# Patient Record
Sex: Female | Born: 1965 | Race: Black or African American | Hispanic: No | Marital: Single
Health system: Southern US, Community
[De-identification: ages and names within clinical notes are randomized; demographics above are authoritative.]

## PROBLEM LIST (undated history)

## (undated) DIAGNOSIS — F250 Schizoaffective disorder, bipolar type: Secondary | ICD-10-CM

## (undated) DIAGNOSIS — F259 Schizoaffective disorder, unspecified: Secondary | ICD-10-CM

## (undated) DIAGNOSIS — K219 Gastro-esophageal reflux disease without esophagitis: Secondary | ICD-10-CM

## (undated) DIAGNOSIS — F4310 Post-traumatic stress disorder, unspecified: Secondary | ICD-10-CM

## (undated) DIAGNOSIS — E119 Type 2 diabetes mellitus without complications: Secondary | ICD-10-CM

## (undated) DIAGNOSIS — I10 Essential (primary) hypertension: Secondary | ICD-10-CM

## (undated) HISTORY — PX: SKIN GRAFT: SHX250

## (undated) HISTORY — PX: TUBAL LIGATION: SHX77

---

## 2003-10-28 ENCOUNTER — Inpatient Hospital Stay (HOSPITAL_COMMUNITY): Admission: AD | Admit: 2003-10-28 | Discharge: 2003-10-28 | Payer: Self-pay | Admitting: Obstetrics & Gynecology

## 2008-01-03 ENCOUNTER — Inpatient Hospital Stay (HOSPITAL_COMMUNITY): Admission: AD | Admit: 2008-01-03 | Discharge: 2008-01-08 | Payer: Self-pay | Admitting: Psychiatry

## 2008-01-03 ENCOUNTER — Ambulatory Visit: Payer: Self-pay | Admitting: Psychiatry

## 2010-09-26 NOTE — H&P (Signed)
[name], [name]               ACCOUNT NO.:  [id number]   MEDICAL RECORD NO.:  [id number]          PATIENT TYPE:  IPS   LOCATION:  0402                          FACILITY:  [facility]   PHYSICIAN:  Geoffery Lyons, M.D.      DATE OF BIRTH:  06-24-1965   DATE OF ADMISSION:  01/03/2008  DATE OF DISCHARGE:                       PSYCHIATRIC ADMISSION ASSESSMENT   DATE AND TIME OF ASSESSMENT:  January 04, 2008 at 7:55 a.m.   IDENTIFYING INFORMATION:  A 45 year old [place] American female who is  single.  This is a voluntary admission.   HISTORY OF PRESENT ILLNESS:  The first [facility] admission, Katherine Franklin presents  with pressured speech, some flight of ideas, agitated this morning.  Came to the emergency room requesting detox from cocaine and alcohol.  Says that she has been diagnosed with schizoaffective disorder.  She has  not taken any of her Abilify or other medications in about a month.  York Spaniel that she was enrolled in classes for drug and alcohol abuse at  [facility], but that angry and left the program.  She has been  sleeping in a pile of newspapers behind the bank and exchanging sex for  drugs or money.  She is a poor historian date today due to  cooperativeness.  Mood and affect are quite labile and she has been  shouting at the staff.  She denies any suicidal thoughts.   Note that the patient reports that she drank 2 glasses of alcohol  yesterday and also drank 1 more time this week.  Says she drinks only  occasionally during the week.  Cocaine has been every day as much as I  can get my hands on.   PAST PSYCHIATRIC HISTORY:  First [facility] admission.  Currently followed by  [facility], Dr. Lily Peer.  Says that she typically  takes Abilify 40 mg daily and has not taken that in more than a month.  She does endorse prior suicide attempts.  Endorses prior admissions to  [facility] in [place] in the past, rehab facility in [place], [place]  [place] and most recently within the past  year had a stay at one of our  state hospitals, which she will not specify.  Says that she has had  trials in the past of Depakote, Risperdal and Seroquel and many more  medications, but refuses to consider anything except Abilify.   SOCIAL HISTORY:  Single African American female, homeless.  Reports she  has family and children, but will not give details.   FAMILY HISTORY:  Not available.   MEDICAL HISTORY:  Primary care practitioner is the downtown [facility].  Medical problems are hypertension and diabetes.   MEDICATIONS:  Abilify, HCTZ, enalapril. metformin and glipizide, doses  not known.  No medications in more than a month.   DRUG ALLERGIES:  SULFA AND IRON, REACTION NOT KNOWN.   POSITIVE PHYSICAL FINDINGS:  Physical exam was done in the emergency  room, is noted in the record.  This is a tall, obese female, disheveled.  VITAL SIGNS:  On admission to our unit, temperature 97.9,  pulse 91,  blood pressure 153/102 and respirations 20.   DIAGNOSTIC STUDIES:  Diagnostic studies were done in the emergency room.  Hemoglobin 10.5, hematocrit 31.0.  Her chemistry sodium 137, potassium  3.6, chloride 103, carbon dioxide not done, BUN 15, creatinine 1.0 and  random glucose was 191.  Pregnancy test negative.  Urine drug screen  positive for cocaine.  Routine urinalysis showed trace leukocytes and  WBCs 0-2 per high-powered field.   MENTAL STATUS EXAMINATION:  Fully alert, oriented to person, place and  situation.  Affect is labile.  She is agitated 80.  Speech is pressured,  rambling shouting and cursing at the staff when they attempt to help  her.  She has had increased motor restlessness this morning, pacing in  the halls, wearing little clothing and exposing herself from time to  time.  Has been uncooperative, slightly intrusive and aggressive at  times.  Mood is irritable and labile.  Thought process, guarded,  paranoid references, belief that staff are against her, or out to  harm  her, if they offer to help her find things or help with her clothing.  No clear suicidal thoughts.  She is a poor historian today due to her  paranoia and lability and generally uncooperative with exam.  Cognitively she is intact and oriented x3.   IMPRESSION:  AXIS I:  Psychosis, not otherwise specified, rule out  schizoaffective disorder.  Polysubstance dependence.  AXIS II:  Deferred.  AXIS III:  Hypertension and diabetes mellitus by history.  AXIS IV:  Severe issues with homelessness.  AXIS V:  Current 32 and past year, not known.   PLAN:  Voluntarily admit her to manage her psychotic symptoms and  hopefully treat her cocaine addiction.  We are going to restart her  Abilify 10 mg, first dose now and we will also start enalapril 10 mg  daily, hydrochlorothiazide 25 mg daily, Ativan 1 mg q.4 h. p.r.n. for  agitation and she will receive a dose now and metformin 500 mg daily.  Will monitor vital signs in progress and adjust as needed.  She  understands and is in agreement with the plan, agreeing to take  medication.      Margaret A. Scott, N.P.      Geoffery Lyons, M.D.  Electronically Signed    MAS/MEDQ  D:  01/05/2008  T:  01/05/2008  Job:  [id number]

## 2010-09-26 NOTE — Discharge Summary (Signed)
Katherine Franklin, Katherine Franklin               ACCOUNT NO.:  [id number]   MEDICAL RECORD NO.:  [id number]          PATIENT TYPE:  IPS   LOCATION:  0402                          FACILITY:  [facility]   PHYSICIAN:  Anselm Jungling, MD  DATE OF BIRTH:  Jan 22, 1966   DATE OF ADMISSION:  01/03/2008  DATE OF DISCHARGE:  01/08/2008                               DISCHARGE SUMMARY   IDENTIFYING DATA AND REASON FOR ADMISSION:  The patient is a 45 year old  single African American female admitted with signs and symptoms of  psychosis and possible mania.  The time of admission she was highly  agitated and pressured.  Please refer to the admission note for further  details pertaining to the symptoms, circumstances and history that led  to her hospitalization.  She was given an initial Axis I diagnosis of  rule out paranoid schizophrenia, acute, rule out manic episode, and rule  out substance-induced psychosis.   MEDICAL AND LABORATORY:  The patient was medically and physically  assessed by the psychiatric nurse practitioner.  She came to [place] with a  history of hypertension and diabetes mellitus.  She was continued on her  usual hydrochlorothiazide, Vasotec, and Glucophage.  There were no acute  medical issues.   HOSPITAL COURSE:  The patient was admitted to the adult inpatient  psychiatric service.  She presented as a well-nourished, normally-  developed female who was extremely alert, vigilant, and pressured.  However, she was aware of her situation and oriented to her  surroundings.  She admitted to recent drug abuse.  She admitted to need  for treatment, and swore she would never abuse drugs again.   She was treated with a regimen that included Abilify 10 mg q.i.d., which  she had taken previously, as well as Depakote.  Her mood and thinking  normalized over the next several days.  She gave [place] permission to  contact her daughter in [place], who indicated that she was willing to  have her mother comes stay  with her.   By the sixth hospital day the patient appeared sufficiently improved to  be able to be discharged to such an aftercare plan.  She agreed to the  following.   AFTERCARE:  The patient was to follow up at [facility]  in the [place] area.  The case manager was to call the patient with a  specific appointment time in date.   DISCHARGE MEDICATIONS:  1. Depakote 1000 mg nightly.  2. Abilify 10 mg q.i.d.  3. Hydrochlorothiazide 25 mg daily.  4. Vasotec 1 mg daily.  5. Glucophage 500 mg q. a.m.   The patient was instructed to follow up with her family doctor regarding  her general medical issues.   DISCHARGE DIAGNOSES:  AXIS I:  1. Bipolar disorder NOS.  2. History of substance abuse NOS.  AXIS II:  Deferred.  AXIS III:  History of hypertension, diabetes mellitus.  AXIS IV:  Stressors severe.  AXIS V:  GAF on discharge 55.      Anselm Jungling, MD  Electronically Signed     SPB/MEDQ  D:  01/08/2008  T:  01/08/2008  Job:  [id number]

## 2017-12-14 ENCOUNTER — Encounter (HOSPITAL_COMMUNITY): Payer: Self-pay | Admitting: *Deleted

## 2017-12-14 ENCOUNTER — Other Ambulatory Visit: Payer: Self-pay

## 2017-12-14 ENCOUNTER — Emergency Department (HOSPITAL_COMMUNITY)
Admission: EM | Admit: 2017-12-14 | Discharge: 2017-12-14 | Disposition: A | Discharge status: Home / Self Care | Payer: Medicaid Other | Attending: Emergency Medicine | Admitting: Emergency Medicine

## 2017-12-14 DIAGNOSIS — R739 Hyperglycemia, unspecified: Secondary | ICD-10-CM | POA: Diagnosis present

## 2017-12-14 DIAGNOSIS — F17200 Nicotine dependence, unspecified, uncomplicated: Secondary | ICD-10-CM | POA: Insufficient documentation

## 2017-12-14 DIAGNOSIS — Z91040 Latex allergy status: Secondary | ICD-10-CM | POA: Insufficient documentation

## 2017-12-14 DIAGNOSIS — I10 Essential (primary) hypertension: Secondary | ICD-10-CM | POA: Insufficient documentation

## 2017-12-14 DIAGNOSIS — E1165 Type 2 diabetes mellitus with hyperglycemia: Secondary | ICD-10-CM | POA: Insufficient documentation

## 2017-12-14 HISTORY — DX: Essential (primary) hypertension: I10

## 2017-12-14 HISTORY — DX: Type 2 diabetes mellitus without complications: E11.9

## 2017-12-14 HISTORY — DX: Post-traumatic stress disorder, unspecified: F43.10

## 2017-12-14 HISTORY — DX: Gastro-esophageal reflux disease without esophagitis: K21.9

## 2017-12-14 HISTORY — DX: Schizoaffective disorder, unspecified: F25.9

## 2017-12-14 HISTORY — DX: Schizoaffective disorder, bipolar type: F25.0

## 2017-12-14 LAB — I-STAT CHEM 8, ED
BUN: 13 mg/dL (ref 6–20)
CALCIUM ION: 1.22 mmol/L (ref 1.15–1.40)
Chloride: 102 mmol/L (ref 98–111)
Creatinine, Ser: 0.8 mg/dL (ref 0.44–1.00)
Glucose, Bld: 382 mg/dL — ABNORMAL HIGH (ref 70–99)
HEMATOCRIT: 32 % — AB (ref 36.0–46.0)
HEMOGLOBIN: 10.9 g/dL — AB (ref 12.0–15.0)
Potassium: 3.7 mmol/L (ref 3.5–5.1)
SODIUM: 140 mmol/L (ref 135–145)
TCO2: 25 mmol/L (ref 22–32)

## 2017-12-14 LAB — CBC WITH DIFFERENTIAL/PLATELET
BASOS PCT: 0 %
Basophils Absolute: 0 10*3/uL (ref 0.0–0.1)
EOS PCT: 3 %
Eosinophils Absolute: 0.1 10*3/uL (ref 0.0–0.7)
HCT: 33.8 % — ABNORMAL LOW (ref 36.0–46.0)
Hemoglobin: 10.8 g/dL — ABNORMAL LOW (ref 12.0–15.0)
LYMPHS ABS: 1.9 10*3/uL (ref 0.7–4.0)
Lymphocytes Relative: 50 %
MCH: 27.3 pg (ref 26.0–34.0)
MCHC: 32 g/dL (ref 30.0–36.0)
MCV: 85.6 fL (ref 78.0–100.0)
MONOS PCT: 7 %
Monocytes Absolute: 0.3 10*3/uL (ref 0.1–1.0)
NEUTROS PCT: 40 %
Neutro Abs: 1.6 10*3/uL — ABNORMAL LOW (ref 1.7–7.7)
Platelets: 241 10*3/uL (ref 150–400)
RBC: 3.95 MIL/uL (ref 3.87–5.11)
RDW: 13.8 % (ref 11.5–15.5)
WBC: 3.9 10*3/uL — ABNORMAL LOW (ref 4.0–10.5)

## 2017-12-14 LAB — CBG MONITORING, ED: Glucose-Capillary: 263 mg/dL — ABNORMAL HIGH (ref 70–99)

## 2017-12-14 LAB — BASIC METABOLIC PANEL
Anion gap: 11 (ref 5–15)
BUN: 14 mg/dL (ref 6–20)
CALCIUM: 9 mg/dL (ref 8.9–10.3)
CO2: 24 mmol/L (ref 22–32)
CREATININE: 0.93 mg/dL (ref 0.44–1.00)
Chloride: 104 mmol/L (ref 98–111)
GFR calc non Af Amer: >60 mL/min (ref >60)
Glucose, Bld: 387 mg/dL — ABNORMAL HIGH (ref 70–99)
Potassium: 3.6 mmol/L (ref 3.5–5.1)
SODIUM: 139 mmol/L (ref 135–145)

## 2017-12-14 MED ORDER — METFORMIN HCL 500 MG PO TABS: 500.0000 mg | ORAL_TABLET | Freq: Two times a day (BID) | ORAL | 0 refills | Status: DC

## 2017-12-14 MED ORDER — INSULIN ASPART 100 UNIT/ML ~~LOC~~ SOLN: 5.0000 [IU] | Freq: Once | SUBCUTANEOUS | Status: DC

## 2017-12-14 MED ORDER — SODIUM CHLORIDE 0.9 % IV BOLUS
2000.0000 mL | Freq: Once | INTRAVENOUS | Status: AC
  Administered 2017-12-14: 2000 mL via INTRAVENOUS

## 2017-12-14 NOTE — ED Notes (Signed)
Bed: WA06 Expected date:  Expected time:  Means of arrival:  Comments: 
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
## 2017-12-14 NOTE — ED Notes (Signed)
Pt refused insulin and wanted to leave. Notified MD. Pt refused discharge paperwork and vitals before departure. IV removed

## 2017-12-14 NOTE — ED Triage Notes (Signed)
Pt brought in via [facility]. Pt is AOx4. Pt is ambulatory. Pt called 911 due to having gut feeling sugar was elevated. Pt CBG was 434 with [facility]. 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 Pt has no other complaints.

## 2017-12-14 NOTE — Discharge Instructions (Addendum)
Please take your medication as prescribed

## 2017-12-16 NOTE — Care Management Note (Signed)
Case Management Note  CM consulted for medication needs and housing.  CM forwarded consult to CSW as all of pt's needs are social issues.  CM is unable to help with pt's medications as she has insurance coverage.  Updated Dr. Adela LankFloyd via messages.  No further CM needs noted at this time.  [name], Lynnae SandhoffAngela N, RN 12/16/2017, 9:33 AM

## 2018-06-18 ENCOUNTER — Emergency Department (HOSPITAL_COMMUNITY)
Admission: EM | Admit: 2018-06-18 | Discharge: 2018-06-19 | Disposition: A | Discharge status: Home / Self Care | Payer: Medicaid Other | Attending: Emergency Medicine | Admitting: Emergency Medicine

## 2018-06-18 ENCOUNTER — Encounter (HOSPITAL_COMMUNITY): Payer: Self-pay | Admitting: *Deleted

## 2018-06-18 ENCOUNTER — Other Ambulatory Visit: Payer: Self-pay

## 2018-06-18 DIAGNOSIS — F1994 Other psychoactive substance use, unspecified with psychoactive substance-induced mood disorder: Secondary | ICD-10-CM | POA: Insufficient documentation

## 2018-06-18 DIAGNOSIS — I10 Essential (primary) hypertension: Secondary | ICD-10-CM | POA: Diagnosis not present

## 2018-06-18 DIAGNOSIS — Z91040 Latex allergy status: Secondary | ICD-10-CM | POA: Insufficient documentation

## 2018-06-18 DIAGNOSIS — F1410 Cocaine abuse, uncomplicated: Secondary | ICD-10-CM | POA: Insufficient documentation

## 2018-06-18 DIAGNOSIS — R739 Hyperglycemia, unspecified: Secondary | ICD-10-CM

## 2018-06-18 DIAGNOSIS — Z79899 Other long term (current) drug therapy: Secondary | ICD-10-CM | POA: Insufficient documentation

## 2018-06-18 DIAGNOSIS — E1165 Type 2 diabetes mellitus with hyperglycemia: Secondary | ICD-10-CM | POA: Diagnosis not present

## 2018-06-18 DIAGNOSIS — F259 Schizoaffective disorder, unspecified: Secondary | ICD-10-CM | POA: Diagnosis not present

## 2018-06-18 DIAGNOSIS — R451 Restlessness and agitation: Secondary | ICD-10-CM | POA: Diagnosis present

## 2018-06-18 LAB — RAPID URINE DRUG SCREEN, HOSP PERFORMED
Amphetamines: NOT DETECTED
Barbiturates: NOT DETECTED
Benzodiazepines: NOT DETECTED
Cocaine: POSITIVE — AB
Opiates: NOT DETECTED
Tetrahydrocannabinol: NOT DETECTED

## 2018-06-18 LAB — URINALYSIS, ROUTINE W REFLEX MICROSCOPIC
Bilirubin Urine: NEGATIVE
Glucose, UA: >=500 mg/dL — AB
Ketones, ur: NEGATIVE mg/dL
NITRITE: NEGATIVE
Protein, ur: NEGATIVE mg/dL
Specific Gravity, Urine: 1.022 (ref 1.005–1.030)
pH: 7 (ref 5.0–8.0)

## 2018-06-18 LAB — COMPREHENSIVE METABOLIC PANEL
ALT: 15 U/L (ref 0–44)
ANION GAP: 12 (ref 5–15)
AST: 15 U/L (ref 15–41)
Albumin: 3.7 g/dL (ref 3.5–5.0)
Alkaline Phosphatase: 82 U/L (ref 38–126)
BILIRUBIN TOTAL: 0.5 mg/dL (ref 0.3–1.2)
BUN: 12 mg/dL (ref 6–20)
CHLORIDE: 101 mmol/L (ref 98–111)
CO2: 25 mmol/L (ref 22–32)
Calcium: 9.4 mg/dL (ref 8.9–10.3)
Creatinine, Ser: 0.85 mg/dL (ref 0.44–1.00)
GFR calc Af Amer: >60 mL/min (ref >60)
GFR calc non Af Amer: >60 mL/min (ref >60)
Glucose, Bld: 378 mg/dL — ABNORMAL HIGH (ref 70–99)
POTASSIUM: 4.3 mmol/L (ref 3.5–5.1)
SODIUM: 138 mmol/L (ref 135–145)
Total Protein: 7.6 g/dL (ref 6.5–8.1)

## 2018-06-18 LAB — CBC WITH DIFFERENTIAL/PLATELET
Abs Immature Granulocytes: 0.01 10*3/uL (ref 0.00–0.07)
BASOS ABS: 0.1 10*3/uL (ref 0.0–0.1)
Basophils Relative: 1 %
Eosinophils Absolute: 0.2 10*3/uL (ref 0.0–0.5)
Eosinophils Relative: 2 %
HEMATOCRIT: 38.5 % (ref 36.0–46.0)
Hemoglobin: 12.4 g/dL (ref 12.0–15.0)
Immature Granulocytes: 0 %
LYMPHS ABS: 2.4 10*3/uL (ref 0.7–4.0)
Lymphocytes Relative: 33 %
MCH: 27.6 pg (ref 26.0–34.0)
MCHC: 32.2 g/dL (ref 30.0–36.0)
MCV: 85.6 fL (ref 80.0–100.0)
Monocytes Absolute: 0.4 10*3/uL (ref 0.1–1.0)
Monocytes Relative: 5 %
Neutro Abs: 4.4 10*3/uL (ref 1.7–7.7)
Neutrophils Relative %: 59 %
Platelets: 409 10*3/uL — ABNORMAL HIGH (ref 150–400)
RBC: 4.5 MIL/uL (ref 3.87–5.11)
RDW: 14.3 % (ref 11.5–15.5)
WBC: 7.4 10*3/uL (ref 4.0–10.5)
nRBC: 0 % (ref 0.0–0.2)

## 2018-06-18 LAB — ACETAMINOPHEN LEVEL

## 2018-06-18 LAB — I-STAT BETA HCG BLOOD, ED (MC, WL, AP ONLY): I-stat hCG, quantitative: <5 m[IU]/mL (ref <5)

## 2018-06-18 LAB — SALICYLATE LEVEL: Salicylate Lvl: <7 mg/dL (ref 2.8–30.0)

## 2018-06-18 LAB — ETHANOL: Alcohol, Ethyl (B): <10 mg/dL

## 2018-06-18 MED ORDER — ACETAMINOPHEN 325 MG PO TABS
650.0000 mg | ORAL_TABLET | ORAL | Status: DC | PRN
  Filled 2018-06-18: qty 2

## 2018-06-18 MED ORDER — ONDANSETRON HCL 4 MG PO TABS: 4.0000 mg | ORAL_TABLET | Freq: Three times a day (TID) | ORAL | Status: DC | PRN

## 2018-06-18 MED ORDER — STERILE WATER FOR INJECTION IJ SOLN
INTRAMUSCULAR | Status: AC
  Filled 2018-06-18: qty 10

## 2018-06-18 MED ORDER — SODIUM CHLORIDE 0.9 % IV BOLUS
1000.0000 mL | Freq: Once | INTRAVENOUS | Status: AC
  Administered 2018-06-18: 1000 mL via INTRAVENOUS

## 2018-06-18 MED ORDER — LORAZEPAM 1 MG PO TABS
1.0000 mg | ORAL_TABLET | ORAL | Status: AC | PRN
  Administered 2018-06-19: 1 mg via ORAL
  Filled 2018-06-18: qty 1

## 2018-06-18 MED ORDER — ZIPRASIDONE MESYLATE 20 MG IM SOLR
20.0000 mg | Freq: Once | INTRAMUSCULAR | Status: AC
  Administered 2018-06-18: 20 mg via INTRAMUSCULAR
  Filled 2018-06-18: qty 20

## 2018-06-18 MED ORDER — METFORMIN HCL 500 MG PO TABS
500.0000 mg | ORAL_TABLET | Freq: Two times a day (BID) | ORAL | Status: DC
  Administered 2018-06-18 – 2018-06-19 (×2): 500 mg via ORAL
  Filled 2018-06-18 (×2): qty 1

## 2018-06-18 MED ORDER — NICOTINE 21 MG/24HR TD PT24: 21.0000 mg | MEDICATED_PATCH | Freq: Every day | TRANSDERMAL | Status: DC

## 2018-06-18 MED ORDER — LORAZEPAM 2 MG/ML IJ SOLN
2.0000 mg | Freq: Once | INTRAMUSCULAR | Status: AC
  Administered 2018-06-18: 2 mg via INTRAVENOUS
  Filled 2018-06-18: qty 1

## 2018-06-18 MED ORDER — ARIPIPRAZOLE 5 MG PO TABS
5.0000 mg | ORAL_TABLET | Freq: Once | ORAL | Status: AC
  Administered 2018-06-18: 5 mg via ORAL
  Filled 2018-06-18: qty 1

## 2018-06-18 MED ORDER — ARIPIPRAZOLE 5 MG PO TABS
5.0000 mg | ORAL_TABLET | Freq: Every day | ORAL | Status: DC
  Administered 2018-06-19: 5 mg via ORAL
  Filled 2018-06-18: qty 1

## 2018-06-18 MED ORDER — ZOLPIDEM TARTRATE 5 MG PO TABS: 5.0000 mg | ORAL_TABLET | Freq: Every evening | ORAL | Status: DC | PRN

## 2018-06-18 NOTE — ED Notes (Addendum)
Pt stood in doorway and pee on floor; Pt c/o about food she was given at dinner and in room throwing the table around; everything removed from the room and and door closed; pt advised she will clean her on urine up or sit in room with smell all night; security called for safety; blinds to room remain open and patient is in viability of sitter; Pt currently in bed-Moniqe,RN

## 2018-06-18 NOTE — ED Notes (Signed)
Original IVC papers placed in red folder and faxed to [facility]; copy placed in medical records and 3 copies on chart; Pt has belongings previously inventoried by prior RN  in locker #2-Monique,RN
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
## 2018-06-18 NOTE — ED Notes (Addendum)
Pt given saltines, chicken broth and Sprite, per Dr. Criss Alvine.

## 2018-06-18 NOTE — ED Notes (Signed)
Pt given chicken broth, crackers and Sprite, per Toniann Fail - RN.

## 2018-06-18 NOTE — BH Assessment (Signed)
Assessment Note  Katherine Franklin is a 53 y.o. female who [facility] basis with complaint of homicidal ideation, intermittent suicidal ideation, despondency.  Per hospital report:   Pt arrived via the EMS door and just walked in yelling she was going to kill some MFs and she even said self and put her hand up to the head with a gun like gesture. Escorted patient directly to a room and told her she will have to calm down and listen. She said she just got down here that she needed help, she smoked crack for the pain in her feet and she has intermittent HI/SI.     Pt provided history.  Pt reported as follows:  She stated that she uses an 8-ball of crack cocaine daily, and that she used today.  Pt stated that today she feels homicidal, with a desire to slit her mother's throat (mother lives in an unknown nursing home) and to [place] drug [place].  Pt also endorsed despondency and intermittent suicidal ideation.  Pt denied suicidal ideation at time of assessment, but she stated that she felt suicidal earlier today.  Pt reported that when she feels suicidal, she thinks about walking into traffic.  Pt also reported that she attempted suicide several years ago by walking into traffic.  Pt endorsed despondency and recent psychosocial stressors -- she works as a prostitute, and she is recently homeless.  Pt denied current psychiatric care, but stated that she is prescribed psychotropic meds through an unnamed PCP.  Pt stated that she would like help in getting back onto medication.   Pt fell asleep several times during assessment and had to be roused.  Per records, Pt has two upcoming court [place] -- one for two counts of possession of drug paraphernalia and one for solicitation of money/begging.  UDS and BAC were not available at time of assessment.  During assessment, Pt presented as oriented x4.  She was sleepy and fell asleep twice during  assessment.  Pt's mood was preoccupied (with getting help) and sad.  Pt's affect was euthymic and pleasant.  Pt endorsed homicidal ideation, recent suicidal ideation, despondency, and other symptoms.  Pt also endorsed substance use.  Pt's speech was normal in rate, rhythm, and volume.  Pt's thought processes were within normal range, and thought content was logical and gaol-oriented.  There was no evidence of delusion.  Pt denied hallucination.  Pt's memory and concentration were fair to poor.  Insight, judgment, and impulse control were poor.  Consulted with S. Rankin, NP, who determined that Pt should remain in ED, be stabilized, and re-evaluated in AM.   Diagnosis: Major Depressive Disorder, Recurrent, Severe w/o psychotic features; Cocaine Use Disorder  Past Medical History:  Past Medical History:  Diagnosis Date  . Diabetes mellitus without complication (HCC)   . GERD (gastroesophageal reflux disease)   . Hypertension   . PTSD (post-traumatic stress disorder)   . Schizo affective schizophrenia [facility](HCC)     Past Surgical History:  Procedure Laterality Date  . SKIN GRAFT      Family History: History reviewed. No pertinent family history.  Social History:  reports that she has been smoking. She has never used smokeless tobacco. She reports previous alcohol use. She reports current drug use. Frequency: 7.00 times per week. Drugs: Cocaine and "Crack" cocaine.  Additional Social History:  Alcohol / Drug Use Pain Medications: See MAR Prescriptions: See MAR [facility] the [facility]  History of alcohol / drug use?: Yes Substance #1 Name of Substance 1: Crack cocaine 1 - Amount (size/oz): 8 ball (about $200 worth) 1 - Frequency: Daily 1 - Duration: Ongoing 1 - Last Use / Amount: 06/18/2018  CIWA: CIWA-Ar BP: (!) 153/89 Pulse Rate: 95 COWS:    Allergies:  Allergies  Allergen Reactions  . Latex   . Sulfa Antibiotics     Home Medications: (Not in a hospital admission)   OB/GYN  Status:  No LMP recorded. Patient is postmenopausal.  General Assessment Data TTS Assessment: In system Is this a Tele or Face-to-Face Assessment?: Tele Assessment Is this an Initial Assessment or a Re-assessment for this encounter?: Initial Assessment Patient Accompanied by:: N/A Language Other than English: No Living Arrangements: Other (Comment) What gender do you identify as?: Female Marital status: Single Pregnancy Status: Unknown Living Arrangements: Alone Can pt return to current living arrangement?: Yes Admission Status: Voluntary Is patient capable of signing voluntary admission?: Yes Referral Source: Self/Family/Friend Insurance type: [employer]     Crisis Care Plan Living Arrangements: Alone Name of Psychiatrist: None Name of Therapist: None  Education Status Is patient currently in school?: No Is the patient employed, unemployed or receiving disability?: Employed  Risk to self with the past 6 months Has patient been a risk to self within the past 6 months prior to admission? : No Suicidal Intent: No Has patient had any suicidal intent within the past 6 months prior to admission? : No Is patient at risk for suicide?: (See notes) Suicidal Plan?: Yes-Currently Present Has patient had any suicidal plan within the past 6 months prior to admission? : No Specify Current Suicidal Plan: Walk into traffic Access to Means: Yes Specify Access to Suicidal Means: Traffic What has been your use of drugs/alcohol within the last 12 months?: Crack cocaine, cocaine, per report Previous Attempts/Gestures: Yes How many times?: 1 Triggers for Past Attempts: Unknown Intentional Self Injurious Behavior: None Family Suicide History: Unknown Recent stressful life event(s): Financial Problems(Recently lost home) Persecutory voices/beliefs?: No Depression: Yes Depression Symptoms: Despondent, Feeling angry/irritable Substance abuse history and/or treatment for substance abuse?:  No Suicide prevention information given to non-admitted patients: Not applicable  Risk to Others within the past 6 months Homicidal Ideation: Yes-Currently Present Does patient have any lifetime risk of violence toward others beyond the six months prior to admission? : Unknown Thoughts of Harm to Others: Yes-Currently Present Comment - Thoughts of Harm to Others: Wants to kill mother, drug dealers Current Homicidal Intent: No Current Homicidal Plan: Yes-Currently Present Describe Current Homicidal Plan: ''I want to slit my mother's throat'' Access to Homicidal Means: Yes Describe Access to Homicidal Means: Knives, blades Identified Victim: Mother, drug dealers History of harm to others?: (Unknown) Assessment of Violence: On admission Violent Behavior Description: Pt ran into ED threatening to kill ''MFers'' Does patient have access to weapons?: Yes (Comment) Criminal Charges Pending?: Yes Describe Pending Criminal Charges: poss drug paraphenalia; soliticitation,  Does patient have a court date: Yes Court Date: 07/07/18 Is patient on probation?: Unknown  Psychosis Hallucinations: None noted Delusions: None noted  Mental Status Report Appearance/Hygiene: Disheveled, In scrubs Eye Contact: Fair Motor Activity: Unremarkable Speech: Logical/coherent Level of Consciousness: Quiet/awake, Sedated(Pt fell asleep twice) Mood: Preoccupied, Sad Affect: Appropriate to circumstance Anxiety Level: None Thought Processes: Coherent, Relevant Judgement: Impaired Orientation: Person, Place, Time, Situation Obsessive Compulsive Thoughts/Behaviors: None  Cognitive Functioning Concentration: Normal Memory: Recent Impaired, Remote Impaired Is patient IDD: No Insight: Fair Impulse Control: Poor Appetite: Good  Have you had any weight changes? : No Change Sleep: No Change Vegetative Symptoms: None  ADLScreening [facility] Assessment Services) Patient's cognitive ability adequate to safely  complete daily activities?: Yes Patient able to express need for assistance with ADLs?: Yes Independently performs ADLs?: Yes (appropriate for developmental age)  Prior Inpatient Therapy Prior Inpatient Therapy: Yes Prior Therapy Dates: (cannot recall) Prior Therapy Facilty/Provider(s): cannot recall Reason for Treatment: SI  Prior Outpatient Therapy Prior Outpatient Therapy: No Does patient have an ACCT team?: No Does patient have Intensive In-House Services?  : No Does patient have [facility] services? : No Does patient have P4CC services?: No  ADL Screening (condition at time of admission) Patient's cognitive ability adequate to safely complete daily activities?: Yes Is the patient deaf or have difficulty hearing?: No Does the patient have difficulty seeing, even when wearing glasses/contacts?: No Does the patient have difficulty concentrating, remembering, or making decisions?: No Patient able to express need for assistance with ADLs?: Yes Does the patient have difficulty dressing or bathing?: No Independently performs ADLs?: Yes (appropriate for developmental age) Does the patient have difficulty walking or climbing stairs?: No Weakness of Legs: None Weakness of Arms/Hands: None  Home Assistive Devices/Equipment Home Assistive Devices/Equipment: None  Therapy Consults (therapy consults require a physician order) PT Evaluation Needed: No OT Evalulation Needed: No SLP Evaluation Needed: No Abuse/Neglect Assessment (Assessment to be complete while patient is alone) Abuse/Neglect Assessment Can Be Completed: Yes Physical Abuse: Denies Verbal Abuse: Denies Sexual Abuse: Denies, provider concered (Comment) Exploitation of patient/patient's resources: Denies Values / Beliefs Cultural Requests During Hospitalization: None Spiritual Requests During Hospitalization: None Consults Spiritual Care Consult Needed: No Social Work Consult Needed: No Merchant navy officer (For  Healthcare) Does Patient Have a Medical Advance Directive?: No          Disposition:  Disposition Initial Assessment Completed for this Encounter: Yes Patient referred to: Other (Comment)(Stabilize, sober, AM psych eval)  On Site Evaluation by:   Reviewed with Physician:    Dorris Fetch Karagan Lehr 06/18/2018 12:45 PM

## 2018-06-18 NOTE — ED Triage Notes (Signed)
Pt pulled shirt and bra at the staff desk shouting about she was going to shit on the walls and floor . Pt shouting about getting her disability.

## 2018-06-18 NOTE — ED Provider Notes (Signed)
Notified that pt is yelling at the desk, demanding her money.  Pt is loud and aggressive.  Geodon IM ordered.   Linwood Dibbles, MD 06/18/18 [phone]

## 2018-06-18 NOTE — ED Triage Notes (Signed)
Pt at staff desk demanding her money. Pt attempting to make  TC . Pt verbally loud  At desk and making unreasonable request. Pt refused a happy meal and demanding  Fried chicken and potatoes.

## 2018-06-18 NOTE — ED Triage Notes (Signed)
Pt refusing to return to room  

## 2018-06-18 NOTE — ED Triage Notes (Signed)
Pt arrived via the EMS door and just walked in yelling she was going to kill some MFs and she even said self and put her hand up to the head with a gun like gesture.  Escorted patient directly to a room and told her she will have to calm down and listen.  She said she just got down here that she needed help, she smoked crack for the pain in her feet and she has intermittent HI/SI.

## 2018-06-18 NOTE — ED Notes (Signed)
Charge RN aware of need for sitter, per staffing no sitter available at this time

## 2018-06-18 NOTE — ED Provider Notes (Signed)
[facility] EMERGENCY DEPARTMENT Provider Note   CSN: [id number] Arrival date & time: 06/18/18  1121   LEVEL 5 CAVEAT - ACUTE PSYCHOSIS  History   Chief Complaint Chief Complaint  Patient presents with  . Suicidal    HPI Katherine Franklin is a 53 y.o. female.  HPI  53 year old female with a history of schizophrenia and diabetes presents with acute agitation and suicidal/homicidal thoughts.  She states that she often abuses cocaine but wants to stop.  However she has chronic bilateral leg/foot pain that she attributes to neuropathy.  She states to help the pain go away she has been abusing crack since yesterday and into this morning.  She denies any acute headache or chest pain.  She states she wants to kill herself and 15 others.  She has a plan to take a gun and go to the mall and kill 15 people and then herself to make it 16.  She states she is had this plan for a long time.  No new injuries or pain to the feet, only her chronic neuropathy.  She is supposed to be on Abilify and Zoloft but has not taken these for months.  When she first arrived to the ER, she was screaming and agitated to multiple staff.  Currently she feels a little calmer.  Past Medical History:  Diagnosis Date  . Diabetes mellitus without complication (HCC)   . GERD (gastroesophageal reflux disease)   . Hypertension   . PTSD (post-traumatic stress disorder)   . Schizo affective schizophrenia (HCC)     There are no active problems to display for this patient.   Past Surgical History:  Procedure Laterality Date  . SKIN GRAFT       OB History   No obstetric history on file.      Home Medications    Prior to Admission medications   Medication Sig Start Date End Date Taking? Authorizing Provider  metFORMIN (GLUCOPHAGE) 500 MG tablet Take 1 tablet (500 mg total) by mouth 2 (two) times daily with a meal. 12/14/17   Melene PlanFloyd, Dan, DO    Family History History reviewed. No pertinent family  history.  Social History Social History   Tobacco Use  . Smoking status: Current Every Day Smoker  . Smokeless tobacco: Never Used  Substance Use Topics  . Alcohol use: Not Currently    Frequency: Never    Comment: BAC not available at time of assessment  . Drug use: Yes    Frequency: 7.0 times per week    Types: Cocaine, "Crack" cocaine    Comment: UDS not available at time of assessment     Allergies   Latex and Sulfa antibiotics   Review of Systems Review of Systems  Unable to perform ROS: Psychiatric disorder     Physical Exam Updated Vital Signs BP (!) 153/89 (BP Location: Right Arm)   Pulse 95   Temp 99.3 F (37.4 C) (Oral)   Resp 18   SpO2 93%   Physical Exam Vitals signs and nursing note reviewed.  Constitutional:      Appearance: She is well-developed. She is obese.  HENT:     Head: Normocephalic and atraumatic.     Right Ear: External ear normal.     Left Ear: External ear normal.     Nose: Nose normal.  Eyes:     General:        Right eye: No discharge.        Left eye:  No discharge.  Cardiovascular:     Rate and Rhythm: Normal rate and regular rhythm.     Pulses:          Dorsalis pedis pulses are 2+ on the right side and 2+ on the left side.     Heart sounds: Normal heart sounds.  Pulmonary:     Effort: Pulmonary effort is normal.     Breath sounds: Normal breath sounds.  Abdominal:     Palpations: Abdomen is soft.     Tenderness: There is no abdominal tenderness.  Skin:    General: Skin is warm and dry.  Neurological:     Mental Status: She is alert.  Psychiatric:        Mood and Affect: Mood is not anxious.        Thought Content: Thought content includes homicidal and suicidal ideation. Thought content includes homicidal and suicidal plan.      ED Treatments / Results  Labs (all labs ordered are listed, but only abnormal results are displayed) Labs Reviewed  COMPREHENSIVE METABOLIC PANEL - Abnormal; Notable for the following  components:      Result Value   Glucose, Bld 378 (*)    All other components within normal limits  ACETAMINOPHEN LEVEL - Abnormal; Notable for the following components:   Acetaminophen (Tylenol), Serum <10 (*)    All other components within normal limits  URINALYSIS, ROUTINE W REFLEX MICROSCOPIC - Abnormal; Notable for the following components:   APPearance HAZY (*)    Glucose, UA >=500 (*)    Hgb urine dipstick SMALL (*)    Leukocytes, UA LARGE (*)    Bacteria, UA RARE (*)    All other components within normal limits  CBC WITH DIFFERENTIAL/PLATELET - Abnormal; Notable for the following components:   Platelets 409 (*)    All other components within normal limits  URINE CULTURE  ETHANOL  SALICYLATE LEVEL  RAPID URINE DRUG SCREEN, HOSP PERFORMED  HIV ANTIBODY (ROUTINE TESTING W REFLEX)  I-STAT BETA HCG BLOOD, ED ([facility], [facility], [facility] ONLY)    EKG EKG Interpretation  Date/Time:  Wednesday June 18 2018 12:07:19 EST Ventricular Rate:  92 PR Interval:    QRS Duration: 82 QT Interval:  363 QTC Calculation: 449 R Axis:   40 Text Interpretation:  Sinus rhythm Abnormal R-wave progression, early transition Confirmed by Pricilla Loveless [phone]) on 06/18/2018 12:55:15 PM Also confirmed by Pricilla Loveless [phone]), editor Barbette Hair [phone])  on 06/18/2018 1:47:50 PM   Radiology No results found.  Procedures Procedures (including critical care time)  Medications Ordered in ED Medications  metFORMIN (GLUCOPHAGE) tablet 500 mg (has no administration in time range)  ARIPiprazole (ABILIFY) tablet 5 mg (has no administration in time range)  LORazepam (ATIVAN) tablet 1 mg (has no administration in time range)  acetaminophen (TYLENOL) tablet 650 mg (has no administration in time range)  zolpidem (AMBIEN) tablet 5 mg (has no administration in time range)  ondansetron (ZOFRAN) tablet 4 mg (has no administration in time range)  nicotine (NICODERM CQ - dosed in mg/24 hours) patch 21 mg (has no  administration in time range)  LORazepam (ATIVAN) injection 2 mg (2 mg Intravenous Given 06/18/18 1154)  sodium chloride 0.9 % bolus 1,000 mL (0 mLs Intravenous Stopped 06/18/18 1438)  ARIPiprazole (ABILIFY) tablet 5 mg (5 mg Oral Given 06/18/18 1207)     Initial Impression / Assessment and Plan / ED Course  I have reviewed the triage vital signs and the nursing notes.  Pertinent  labs & imaging results that were available during my care of the patient were reviewed by me and considered in my medical decision making (see chart for details).     Patient was involuntarily committed based on her suicidal and homicidal thoughts.  She was given p.o. Abilify at the dose she states she is supposed to be on.  She is also given a dose of IV Ativan.  Besides some hypertension, vitals are okay and her lab work is benign.  She does have some hyperglycemia but no acidosis.  Urine shows large leukocytes but also squamous epithelial cells and no urinary symptoms.  She appears medically stable for psychiatric disposition.  Final Clinical Impressions(s) / ED Diagnoses   Final diagnoses:  None    ED Discharge Orders    None       Pricilla LovelessGoldston, Alicya Bena, MD 06/18/18 1443

## 2018-06-18 NOTE — ED Triage Notes (Signed)
Pt  In room shouting  Vulgar  Statements to Security and [employer]. Pt stated " I am going to shit all over thewall and the floor."

## 2018-06-18 NOTE — ED Notes (Signed)
Pt given 3XL scrubs. Pt expressed trouble with paper scrubs in the past. This NT instructed pt that if scrubs did not fit she could wear a gown for better comfort.

## 2018-06-19 ENCOUNTER — Encounter (HOSPITAL_COMMUNITY): Payer: Self-pay | Admitting: Registered Nurse

## 2018-06-19 LAB — HIV ANTIBODY (ROUTINE TESTING W REFLEX): HIV Screen 4th Generation wRfx: NONREACTIVE

## 2018-06-19 MED ORDER — ZIPRASIDONE MESYLATE 20 MG IM SOLR
20.0000 mg | Freq: Once | INTRAMUSCULAR | Status: DC | PRN
  Filled 2018-06-19: qty 20

## 2018-06-19 MED ORDER — STERILE WATER FOR INJECTION IJ SOLN
INTRAMUSCULAR | Status: AC
  Filled 2018-06-19: qty 10

## 2018-06-19 NOTE — Progress Notes (Signed)
Patient does not meet inpatient criteria and is recommended for d/c with resources.  CSW to fax resources for shelters and Adult Mental Health resources to patient's RN, Magda Paganini.  Timmothy Euler. Kaylyn Lim, MSW, LCSWA Disposition Clinical Social Work [phone] (cell) [phone] (office)

## 2018-06-19 NOTE — ED Notes (Signed)
Declined W/C at D/C and was escorted to lobby by RN. 

## 2018-06-19 NOTE — ED Notes (Signed)
Patient was given a snack and drink. 

## 2018-06-19 NOTE — ED Notes (Signed)
Pt woke yelling for call light stating she had to have a bowel movement; pt claims she only peed on the floor because no one told her the door was not locked; Pt was rude to staff and started demanding different items; pt is now taking a shower-Monique,RN

## 2018-06-19 NOTE — ED Notes (Signed)
Sitter advised RN that patient stood up beside the bed and urinated again; pt returned to bed; Door to room remains closed and blinds open for staff visibility of patient-Monique,RN

## 2018-06-19 NOTE — ED Triage Notes (Addendum)
PT reported she was mad at staff and peed on the floor on  Pur[address].

## 2018-06-19 NOTE — Consult Note (Signed)
Tele Assessment   Katherine Franklin, 53 y.o., female patient presented to [facility] walking through the EMS door yelling homicidal and suicidal threats; Reporting to EDP that she has a plan to take a gun; go to mall kill 15 people and then herself to make it 16.  Patient has a his or schizophrenia and non compliance with medication.  Review of chart notes patient seen at [facility] ED 05/26/18 requesting medication samples and then wanting to leave when she wasn't able to get them.  Patient seen via telepsych by this provider; chart reviewed and consulted with Dr. Lucianne Muss on 06/19/18.  On evaluation Katherine Franklin reports she came to [place] from [facility] "I OD on crack; got me a bus ticket in [place] and [place] to [place] cause they won't helping me over there.  I came through yall's back door.  I was going to [facility] but they won't helping me.  I just need to get some help I got diabetes and neuropathy in my feet real bad.  I just want to stay here in the hospital until my check comes."  Patient asked when she expected her check to come; "It comes the first of every month.  The crack ain't the problem; I just need some where to lay my head at night."  Patient states she has already been to the [facility] and "lady helped me get my ID and gave me a address for my check to be mailed to; she gave me the name of a place where I might be able to get some help for my crack use."  Patient states that she is not suicidal "Naw, I don't want to kill myself."  Patient states she has no intent or plans to harm or kill herself.  Patient states that she is not having any thoughts of hurting or killing anyone else "Naw, I ain't gonna hurt nobody; I would never hurt nobody."  Patient also denies psychosis.  "Sometimes I do but not right now.  I don't know the last time."  Patient denies paranoia. "I just need to get back to [address] to see if that lady can help me find a shelter."  Patient states that she plans to  stay in [place] and not return to [facility]  Discussed with patient community resource available for outpatient psychiatric services that could help with medications ([facility], [facility], and [facility]) and would also give her a list of local shelters.  Patient states she wants to leave before it gets to late so she can find somewhere to stat "and if I can fax a picture of my ID; the lady gave me a paper copy; they'll send me a debt card I can use.  None of these people will do it here."  Patient reporting homelessness and no family or friends.  Patient informs psychiatric history but no elaboration given only stating was she was previously receiving treatment.  Patient has remained calm, cooperative and smiled through out interview.   During evaluation Katherine Franklin is sitting up in bed; she is alert/oriented x 4; calm/cooperative; and mood congruent with affect.  Patient is speaking in a clear tone at moderate volume, and normal pace; with good eye contact.  Her thought process is coherent and relevant; There is no indication that she is currently responding to internal/external stimuli or experiencing delusional thought content.  Patient denies suicidal/self-harm/homicidal ideation, psychosis, and paranoia.  Patient has remained calm throughout assessment and has  answered questions appropriately.  Reviewing (Care Everywhere) patient has a significant history of frequent ED visits related to diabetes or requesting medications assistance.   Collateral gotten from Dennis AcresAudrey, RN patients nurse who reports that patient has been urinating in floor refusing to go to bathroom, states that patient has been yelling inappropriate sexual suggestions, cursing, took cloths off in front of nursing desk and fondled breast in front of men; and that patient stated that she was gong to "shit on floor and spread it all on the walls."      Recommendations:  Outpatient psychiatric services; Give  referral resources for community services for psychiatry and substance use   Disposition: No evidence of imminent risk to self or others at present.   Patient does not meet criteria for psychiatric inpatient admission. Supportive therapy provided about ongoing stressors. Refer to IOP. Discussed crisis plan, support from social network, calling 911, coming to the Emergency Department, and calling Suicide Hotline. [facility], and with the referral to outpatient psychiatric services provided as walk in for initial assessment   Spoke with Dr. Denton LankSteinl; informed of above recommendation and disposition   Assunta FoundShuvon Rankin, NP

## 2018-06-19 NOTE — ED Provider Notes (Signed)
Discussed pt with [facility] team - they indicate pt psych cleared for discharge, and that they have provider patient resources for outpt f/u.  Patient awake and alert, calm. No delusions or acute psychosis. Normal mood/affect. Pt denies thoughts of harm to self or others.   Pt currently appears stable for d/c.      Cathren Laine, MD 06/19/18 1041

## 2018-06-19 NOTE — ED Notes (Signed)
Pt woke up screaming to the top of her lungs stating she has to go the bathroom; RN opended the door and stated to patient she is able to walk to the bathroom; pt was argumentative and continued to yell at staff; Pt has peed on  Floor 3-4 times and refuses to walk to the bathroom; Door to room remains closed at this time; EDP contacted for possible PRN Geodon order-Monique,RN

## 2018-06-19 NOTE — ED Notes (Signed)
Patient using phone at this time.

## 2018-06-19 NOTE — ED Notes (Signed)
Regular Diet was ordered for Lunch. 

## 2018-06-19 NOTE — Discharge Instructions (Signed)
It was our pleasure to provide your ER care today - we hope that you feel better.  Avoid cocaine use. Use resource guide provided for outpatient substance abuse and mental health counseling and support.   Your blood pressure is high, and your blood sugar is high. Continue your diabetic medication, drink plenty of water,  and follow diabetic meal/eating plan. Follow up with primary care doctor in the next 2-3 days for follow up, including recheck of blood sugar and blood pressure.   For mental health issues and/or crisis, go directly to [facility].  Return to ER if worse, new symptoms, new or severe pain, trouble breathing, other concern.

## 2018-06-20 LAB — URINE CULTURE

## 2018-06-25 ENCOUNTER — Emergency Department (HOSPITAL_COMMUNITY)
Admission: EM | Admit: 2018-06-25 | Discharge: 2018-06-25 | Disposition: A | Discharge status: Home / Self Care | Payer: Medicaid Other | Attending: Emergency Medicine | Admitting: Emergency Medicine

## 2018-06-25 ENCOUNTER — Encounter (HOSPITAL_COMMUNITY): Payer: Self-pay | Admitting: Emergency Medicine

## 2018-06-25 DIAGNOSIS — Z5321 Procedure and treatment not carried out due to patient leaving prior to being seen by health care provider: Secondary | ICD-10-CM | POA: Diagnosis not present

## 2018-06-25 DIAGNOSIS — M79671 Pain in right foot: Secondary | ICD-10-CM

## 2018-06-25 DIAGNOSIS — M79672 Pain in left foot: Secondary | ICD-10-CM | POA: Diagnosis not present

## 2018-06-25 NOTE — ED Triage Notes (Signed)
Patient with bilateral foot pain due to neuropathy.  She has not been taking gabapentin, which she states does not work for her.

## 2018-06-25 NOTE — ED Provider Notes (Signed)
53 year old female here with complaints of neuropathy.  Contacted by nursing staff that she was screaming cussing and threatening people in the lobby.  I attempted to talk with her, she continued to cussing me attempted to look at her lower extremities she said "do not touch my fucking feet".  Patient is very agitated she would not allow for any further discussion.  She is cussing at police officers, flashing her genitalia in the waiting room.  I do not feel that her presence in the emergency room is safe for staff or patients in the waiting room.  Patient will need to be removed from the premises for safety of staff and patients.    Eyvonne Mechanic, PA-C 06/25/18 5003    Jacalyn Lefevre, MD 06/25/18 [phone]

## 2018-06-25 NOTE — ED Notes (Signed)
Pt given 2 warm blankets

## 2018-07-09 ENCOUNTER — Ambulatory Visit: Payer: Medicaid Other | Admitting: Family Medicine

## 2018-08-04 ENCOUNTER — Emergency Department (HOSPITAL_COMMUNITY)
Admission: EM | Admit: 2018-08-04 | Discharge: 2018-08-04 | Disposition: A | Discharge status: Home / Self Care | Payer: Medicaid Other | Attending: Emergency Medicine | Admitting: Emergency Medicine

## 2018-08-04 ENCOUNTER — Other Ambulatory Visit: Payer: Self-pay

## 2018-08-04 DIAGNOSIS — I10 Essential (primary) hypertension: Secondary | ICD-10-CM | POA: Insufficient documentation

## 2018-08-04 DIAGNOSIS — Z91040 Latex allergy status: Secondary | ICD-10-CM | POA: Insufficient documentation

## 2018-08-04 DIAGNOSIS — Z5900 Homelessness unspecified: Secondary | ICD-10-CM

## 2018-08-04 DIAGNOSIS — E1165 Type 2 diabetes mellitus with hyperglycemia: Secondary | ICD-10-CM | POA: Insufficient documentation

## 2018-08-04 DIAGNOSIS — Z9114 Patient's other noncompliance with medication regimen: Secondary | ICD-10-CM | POA: Diagnosis not present

## 2018-08-04 DIAGNOSIS — F17200 Nicotine dependence, unspecified, uncomplicated: Secondary | ICD-10-CM | POA: Diagnosis not present

## 2018-08-04 DIAGNOSIS — R739 Hyperglycemia, unspecified: Secondary | ICD-10-CM

## 2018-08-04 LAB — BASIC METABOLIC PANEL
Anion gap: 11 (ref 5–15)
BUN: 27 mg/dL — ABNORMAL HIGH (ref 6–20)
CALCIUM: 8.8 mg/dL — AB (ref 8.9–10.3)
CO2: 26 mmol/L (ref 22–32)
Chloride: 102 mmol/L (ref 98–111)
Creatinine, Ser: 0.97 mg/dL (ref 0.44–1.00)
GFR calc Af Amer: >60 mL/min (ref >60)
Glucose, Bld: 313 mg/dL — ABNORMAL HIGH (ref 70–99)
Potassium: 3.7 mmol/L (ref 3.5–5.1)
Sodium: 139 mmol/L (ref 135–145)

## 2018-08-04 LAB — CBC WITH DIFFERENTIAL/PLATELET
Abs Immature Granulocytes: 0.01 10*3/uL (ref 0.00–0.07)
Basophils Absolute: 0 10*3/uL (ref 0.0–0.1)
Basophils Relative: 1 %
Eosinophils Absolute: 0.1 10*3/uL (ref 0.0–0.5)
Eosinophils Relative: 2 %
HCT: 35.6 % — ABNORMAL LOW (ref 36.0–46.0)
Hemoglobin: 11.4 g/dL — ABNORMAL LOW (ref 12.0–15.0)
Immature Granulocytes: 0 %
Lymphocytes Relative: 36 %
Lymphs Abs: 2.3 10*3/uL (ref 0.7–4.0)
MCH: 27.6 pg (ref 26.0–34.0)
MCHC: 32 g/dL (ref 30.0–36.0)
MCV: 86.2 fL (ref 80.0–100.0)
Monocytes Absolute: 0.5 10*3/uL (ref 0.1–1.0)
Monocytes Relative: 8 %
Neutro Abs: 3.4 10*3/uL (ref 1.7–7.7)
Neutrophils Relative %: 53 %
Platelets: 237 10*3/uL (ref 150–400)
RBC: 4.13 MIL/uL (ref 3.87–5.11)
RDW: 13.4 % (ref 11.5–15.5)
WBC: 6.4 10*3/uL (ref 4.0–10.5)
nRBC: 0 % (ref 0.0–0.2)

## 2018-08-04 LAB — CBG MONITORING, ED: Glucose-Capillary: 290 mg/dL — ABNORMAL HIGH (ref 70–99)

## 2018-08-04 MED ORDER — METFORMIN HCL 500 MG PO TABS: 500.0000 mg | ORAL_TABLET | Freq: Two times a day (BID) | ORAL | 0 refills | Status: DC

## 2018-08-04 MED ORDER — SODIUM CHLORIDE 0.9 % IV BOLUS
1000.0000 mL | Freq: Once | INTRAVENOUS | Status: AC
  Administered 2018-08-04: 1000 mL via INTRAVENOUS

## 2018-08-04 NOTE — ED Notes (Signed)
Bed: WA21 Expected date:  Expected time:  Means of arrival:  Comments: 53 yo F/ Behavioral
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
## 2018-08-04 NOTE — Progress Notes (Signed)
CSW met with pt to offer resources, pt accepted inpatient SA Tx resources and a shelter list.  CSW provided education to the pt as to the efficacy of inpatient treatment and provided pt with a bus pass to get to the [facility] with instructions on how to point to the [employer] address and show the bus driver, as well as a bus pass to [facility] and counseled pt on how to point to the address for [facility] and show the bus driver on 3/27 in order for the pt to get to [facility].  CSW also provided pt with a bus pass to get the pt to the [facility] bus stop in [place], as well as a [facility] bus pass provided to assist the pt in getting to [place] if pt is unable to seek admission successfully into [facility] on [address] address) for [employer]   CSW went over the information with pt X 3.  Pt voiced understanding X3 and appreciated CSW's efforts and thanked the CSW.  RN/EDP updated.  Please reconsult if future social work needs arise.  CSW signing off, as social work intervention is no longer needed.  Alphonse Guild. Amberle Lyter, LCSW, LCAS, CSI Transitions of Care Clinical Social Worker Care Coordination Department Ph: [phone]

## 2018-08-04 NOTE — ED Notes (Signed)
Pt given and verbalized understanding of d/c instructions and need for follow up with health department. Told to return if s/s worsen. Given bus passes and resources upon discharge. Pt left in no acute distress and without further questions

## 2018-08-04 NOTE — ED Provider Notes (Signed)
[facility]-EMERGENCY DEPT Provider Note   CSN: [id number] Arrival date & time: 08/04/18  1947    History   Chief Complaint Chief Complaint  Patient presents with  . Addiction Problem  . Homeless    HPI Katherine Franklin is a 53 y.o. female.     The history is provided by the patient and the EMS personnel.  Hyperglycemia  Severity:  Mild Onset quality:  Gradual Timing:  Constant Progression:  Unchanged Chronicity:  Chronic Context: noncompliance   Relieved by:  Nothing Ineffective treatments:  None tried Associated symptoms: no abdominal pain, no chest pain, no dysuria, no fever, no shortness of breath and no vomiting     Past Medical History:  Diagnosis Date  . Diabetes mellitus without complication (HCC)   . GERD (gastroesophageal reflux disease)   . Hypertension   . PTSD (post-traumatic stress disorder)   . Schizo affective schizophrenia (HCC)     There are no active problems to display for this patient.   Past Surgical History:  Procedure Laterality Date  . SKIN GRAFT       OB History   No obstetric history on file.      Home Medications    Prior to Admission medications   Medication Sig Start Date End Date Taking? Authorizing Provider  metFORMIN (GLUCOPHAGE) 500 MG tablet Take 1 tablet (500 mg total) by mouth 2 (two) times daily with a meal. 08/04/18   Katherine Haring, DO    Family History No family history on file.  Social History Social History   Tobacco Use  . Smoking status: Current Every Day Smoker  . Smokeless tobacco: Never Used  Substance Use Topics  . Alcohol use: Not Currently    Frequency: Never    Comment: BAC not available at time of assessment  . Drug use: Yes    Frequency: 7.0 times per week    Types: Cocaine, "Crack" cocaine    Comment: UDS not available at time of assessment     Allergies   Latex and Sulfa antibiotics   Review of Systems Review of Systems  Constitutional: Negative for chills  and fever.  HENT: Negative for ear pain and sore throat.   Eyes: Negative for pain and visual disturbance.  Respiratory: Negative for cough and shortness of breath.   Cardiovascular: Negative for chest pain and palpitations.  Gastrointestinal: Negative for abdominal pain and vomiting.  Genitourinary: Negative for dysuria and hematuria.  Musculoskeletal: Negative for arthralgias and back pain.  Skin: Negative for color change and rash.  Neurological: Negative for seizures and syncope.  All other systems reviewed and are negative.    Physical Exam Updated Vital Signs BP (!) 156/91 (BP Location: Right Arm)   Pulse (!) 102   Resp 16   Ht 5\' 7"  (1.702 m)   Wt 110.2 kg   SpO2 99%   BMI 38.06 kg/m   Physical Exam Vitals signs and nursing note reviewed.  Constitutional:      General: She is not in acute distress.    Appearance: She is well-developed.  HENT:     Head: Normocephalic and atraumatic.     Nose: Nose normal.     Mouth/Throat:     Mouth: Mucous membranes are moist.  Eyes:     Extraocular Movements: Extraocular movements intact.     Conjunctiva/sclera: Conjunctivae normal.     Pupils: Pupils are equal, round, and reactive to light.  Neck:     Musculoskeletal: Normal range of motion  and neck supple.  Cardiovascular:     Rate and Rhythm: Normal rate and regular rhythm.     Pulses: Normal pulses.     Heart sounds: Normal heart sounds. No murmur.  Pulmonary:     Effort: Pulmonary effort is normal. No respiratory distress.     Breath sounds: Normal breath sounds.  Abdominal:     Palpations: Abdomen is soft.     Tenderness: There is no abdominal tenderness.  Skin:    General: Skin is warm and dry.  Neurological:     General: No focal deficit present.     Mental Status: She is alert.  Psychiatric:        Mood and Affect: Mood normal.        Thought Content: Thought content does not include homicidal or suicidal ideation. Thought content does not include homicidal  or suicidal plan.      ED Treatments / Results  Labs (all labs ordered are listed, but only abnormal results are displayed) Labs Reviewed  CBC WITH DIFFERENTIAL/PLATELET - Abnormal; Notable for the following components:      Result Value   Hemoglobin 11.4 (*)    HCT 35.6 (*)    All other components within normal limits  BASIC METABOLIC PANEL - Abnormal; Notable for the following components:   Glucose, Bld 313 (*)    BUN 27 (*)    Calcium 8.8 (*)    All other components within normal limits  CBG MONITORING, ED - Abnormal; Notable for the following components:   Glucose-Capillary 290 (*)    All other components within normal limits    EKG None  Radiology No results found.  Procedures Procedures (including critical care time)  Medications Ordered in ED Medications  sodium chloride 0.9 % bolus 1,000 mL (1,000 mLs Intravenous New Bag/Given 08/04/18 2030)     Initial Impression / Assessment and Plan / ED Course  I have reviewed the triage vital signs and the nursing notes.  Pertinent labs & imaging results that were available during my care of the patient were reviewed by me and considered in my medical decision making (see chart for details).     Katherine Franklin is a 53 year old female with history of drug abuse, diabetes, schizoaffective disorder who presents to the ED with homelessness, and need for social assistance.  Patient with normal vitals.  No fever.  Patient overall worried about her current living situation.  She is concerned that her blood sugar is high as it was high with EMS.  She does admit to using some drugs today.  She appears clinically sober.  She denies any SI or HI.  Lab work showed hyperglycemia but no signs of DKA.  Patient was given fluid bolus.  She was given a prescription for metformin.  Social work was contacted to help initiate any housing recommendations and connections.  Patient was hemodynamically stable throughout my care.  Overall there are no  acute clinical problems.  Patient mostly here for social visit.  Given return precautions and discharged from ED in good condition.  This chart was dictated using voice recognition software.  Despite best efforts to proofread,  errors can occur which can change the documentation meaning.    Final Clinical Impressions(s) / ED Diagnoses   Final diagnoses:  Homelessness  Hyperglycemia    ED Discharge Orders         Ordered    metFORMIN (GLUCOPHAGE) 500 MG tablet  2 times daily with meals  08/04/18 2118           Virgina Norfolk, DO 08/04/18 2120

## 2018-08-04 NOTE — ED Notes (Signed)
Social Worker at bedside advising Pt. Pt in NAD, awaiting discharge.

## 2018-08-17 ENCOUNTER — Encounter (HOSPITAL_COMMUNITY): Payer: Self-pay | Admitting: Emergency Medicine

## 2018-08-17 ENCOUNTER — Emergency Department (HOSPITAL_COMMUNITY)
Admission: EM | Admit: 2018-08-17 | Discharge: 2018-08-17 | Disposition: A | Discharge status: Home / Self Care | Payer: Medicaid Other | Attending: Emergency Medicine | Admitting: Emergency Medicine

## 2018-08-17 ENCOUNTER — Other Ambulatory Visit: Payer: Self-pay

## 2018-08-17 ENCOUNTER — Emergency Department (HOSPITAL_COMMUNITY): Payer: Medicaid Other

## 2018-08-17 DIAGNOSIS — R0789 Other chest pain: Secondary | ICD-10-CM | POA: Insufficient documentation

## 2018-08-17 DIAGNOSIS — Z91040 Latex allergy status: Secondary | ICD-10-CM | POA: Insufficient documentation

## 2018-08-17 DIAGNOSIS — F1410 Cocaine abuse, uncomplicated: Secondary | ICD-10-CM | POA: Insufficient documentation

## 2018-08-17 DIAGNOSIS — F17210 Nicotine dependence, cigarettes, uncomplicated: Secondary | ICD-10-CM | POA: Diagnosis not present

## 2018-08-17 DIAGNOSIS — Z79899 Other long term (current) drug therapy: Secondary | ICD-10-CM | POA: Insufficient documentation

## 2018-08-17 DIAGNOSIS — Z5900 Homelessness unspecified: Secondary | ICD-10-CM

## 2018-08-17 DIAGNOSIS — R079 Chest pain, unspecified: Secondary | ICD-10-CM | POA: Diagnosis present

## 2018-08-17 DIAGNOSIS — I10 Essential (primary) hypertension: Secondary | ICD-10-CM | POA: Diagnosis not present

## 2018-08-17 DIAGNOSIS — E119 Type 2 diabetes mellitus without complications: Secondary | ICD-10-CM | POA: Insufficient documentation

## 2018-08-17 LAB — TROPONIN I: Troponin I: <0.03 ng/mL (ref <0.03)

## 2018-08-17 NOTE — ED Notes (Signed)
Pt given sandwich and diet soda

## 2018-08-17 NOTE — ED Provider Notes (Signed)
[facility] EMERGENCY DEPARTMENT Provider Note   CSN: [id number] Arrival date & time: 08/17/18  2446    History   Chief Complaint Chief Complaint  Patient presents with  . Chest Pain    HPI Katherine Franklin is a 53 y.o. female.     HPI  This is a 53 year old female with a history of diabetes, hypertension, schizoaffective disorder, crack cocaine use who presents with chest pain.  Patient reports that she has been on a 9-day binge of crack cocaine use.  She was smoking earlier this evening when she had onset of anterior nonradiating chest pain.  She has had similar symptoms in the past.  She denies any history of heart disease.  She was given nitroglycerin by EMS and had improvement of her pain.  She denies any associated fevers, cough, shortness of breath.  She denies any lower extremity swelling.  Patient reports that she is homeless and "sleeping behind dumpsters."  She denies any other illicit drug use.  Past Medical History:  Diagnosis Date  . Diabetes mellitus without complication (HCC)   . GERD (gastroesophageal reflux disease)   . Hypertension   . PTSD (post-traumatic stress disorder)   . Schizo affective schizophrenia (HCC)     There are no active problems to display for this patient.   Past Surgical History:  Procedure Laterality Date  . SKIN GRAFT       OB History   No obstetric history on file.      Home Medications    Prior to Admission medications   Medication Sig Start Date End Date Taking? Authorizing Provider  metFORMIN (GLUCOPHAGE) 500 MG tablet Take 1 tablet (500 mg total) by mouth 2 (two) times daily with a meal. 08/04/18   Curatolo, Adam, DO    Family History No family history on file.  Social History Social History   Tobacco Use  . Smoking status: Current Every Day Smoker    Packs/day: 1.50  . Smokeless tobacco: Never Used  Substance Use Topics  . Alcohol use: Not Currently    Frequency: Never    Comment: BAC not  available at time of assessment  . Drug use: Yes    Frequency: 7.0 times per week    Types: Cocaine, "Crack" cocaine    Comment: UDS not available at time of assessment     Allergies   Latex and Sulfa antibiotics   Review of Systems Review of Systems  Constitutional: Negative for fever.  Respiratory: Negative for cough and shortness of breath.   Cardiovascular: Positive for chest pain. Negative for leg swelling.  Gastrointestinal: Negative for nausea and vomiting.  Genitourinary: Negative for dysuria.  Musculoskeletal: Negative for back pain.  All other systems reviewed and are negative.    Physical Exam Updated Vital Signs BP 109/66   Pulse 93   Temp 98.6 F (37 C) (Oral)   Resp 14   Ht 1.753 m (5\' 9" )   Wt 106.1 kg   SpO2 97%   BMI 34.56 kg/m   Physical Exam Vitals signs and nursing note reviewed.  Constitutional:      Appearance: She is well-developed.     Comments: Obese, nontoxic-appearing, no acute distress Disheveled appearing  HENT:     Head: Normocephalic and atraumatic.  Neck:     Musculoskeletal: Neck supple.  Cardiovascular:     Rate and Rhythm: Normal rate and regular rhythm.     Heart sounds: Normal heart sounds.  Pulmonary:     Effort:  Pulmonary effort is normal. No respiratory distress.  Abdominal:     General: Bowel sounds are normal.     Palpations: Abdomen is soft.  Musculoskeletal:     Comments: Trace bilateral lower extremity edema  Skin:    General: Skin is warm and dry.  Neurological:     Mental Status: She is alert and oriented to person, place, and time.      ED Treatments / Results  Labs (all labs ordered are listed, but only abnormal results are displayed) Labs Reviewed  TROPONIN I    EKG EKG Interpretation  Date/Time:  Sunday August 17 2018 02:43:10 EDT Ventricular Rate:  94 PR Interval:    QRS Duration: 78 QT Interval:  375 QTC Calculation: 469 R Axis:   60 Text Interpretation:  Sinus rhythm Confirmed by  Ross Marcus ([id number]) on 08/17/2018 2:53:01 AM   Radiology Dg Chest 2 View  Result Date: 08/17/2018 CLINICAL DATA:  Chest pain EXAM: CHEST - 2 VIEW COMPARISON:  None. FINDINGS: The heart size and mediastinal contours are within normal limits. Both lungs are clear. The visualized skeletal structures are unremarkable. IMPRESSION: No active cardiopulmonary disease. Electronically Signed   By: Deatra Robinson M.D.   On: 08/17/2018 03:39    Procedures Procedures (including critical care time)  Medications Ordered in ED Medications - No data to display   Initial Impression / Assessment and Plan / ED Course  I have reviewed the triage vital signs and the nursing notes.  Pertinent labs & imaging results that were available during my care of the patient were reviewed by me and considered in my medical decision making (see chart for details).        Patient presents with chest pain in the setting of cocaine use and abuse.  She has a history of the same.  She is overall nontoxic-appearing and vital signs are reassuring.  Physical exam is fairly benign.  EKG shows no evidence of acute ischemia.  Chest x-ray is negative for pneumothorax or pneumonia.  Troponin is negative x1.  Patient has high risk behaviors in addition to hypertension and diabetes; however, feel her pain is likely related to her cocaine abuse.  Patient rested comfortably on the monitor while in the ED.  Recommend discontinuing illicit drug use.  After history, exam, and medical workup I feel the patient has been appropriately medically screened and is safe for discharge home. Pertinent diagnoses were discussed with the patient. Patient was given return precautions.   Final Clinical Impressions(s) / ED Diagnoses   Final diagnoses:  Atypical chest pain  Cocaine abuse [facility])  Homeless    ED Discharge Orders    None       Shon Baton, MD 08/17/18 [phone]

## 2018-08-17 NOTE — ED Triage Notes (Signed)
Pt presents to Ed c/o of chest pain for 2 hours. Given nitro and aspirin by EMS. Per EMS bp 170/100 down to 131/74 after nitro. Pt says now her pain is 3. Pt sates this has happened before. Hx HTN and crack abuse.

## 2018-08-18 ENCOUNTER — Encounter (HOSPITAL_COMMUNITY): Payer: Self-pay | Admitting: Emergency Medicine

## 2018-08-18 ENCOUNTER — Emergency Department (HOSPITAL_COMMUNITY)
Admission: EM | Admit: 2018-08-18 | Discharge: 2018-08-19 | Disposition: A | Discharge status: Home / Self Care | Payer: Medicaid Other | Attending: Emergency Medicine | Admitting: Emergency Medicine

## 2018-08-18 ENCOUNTER — Other Ambulatory Visit: Payer: Self-pay

## 2018-08-18 DIAGNOSIS — R45851 Suicidal ideations: Secondary | ICD-10-CM | POA: Diagnosis not present

## 2018-08-18 DIAGNOSIS — F209 Schizophrenia, unspecified: Secondary | ICD-10-CM | POA: Diagnosis not present

## 2018-08-18 DIAGNOSIS — E119 Type 2 diabetes mellitus without complications: Secondary | ICD-10-CM | POA: Diagnosis not present

## 2018-08-18 DIAGNOSIS — F17200 Nicotine dependence, unspecified, uncomplicated: Secondary | ICD-10-CM | POA: Diagnosis not present

## 2018-08-18 DIAGNOSIS — Z91040 Latex allergy status: Secondary | ICD-10-CM | POA: Insufficient documentation

## 2018-08-18 DIAGNOSIS — F332 Major depressive disorder, recurrent severe without psychotic features: Secondary | ICD-10-CM | POA: Diagnosis not present

## 2018-08-18 DIAGNOSIS — I10 Essential (primary) hypertension: Secondary | ICD-10-CM | POA: Diagnosis not present

## 2018-08-18 DIAGNOSIS — F1420 Cocaine dependence, uncomplicated: Secondary | ICD-10-CM | POA: Diagnosis not present

## 2018-08-18 DIAGNOSIS — Z7984 Long term (current) use of oral hypoglycemic drugs: Secondary | ICD-10-CM | POA: Insufficient documentation

## 2018-08-18 DIAGNOSIS — F99 Mental disorder, not otherwise specified: Secondary | ICD-10-CM | POA: Diagnosis present

## 2018-08-18 LAB — RAPID URINE DRUG SCREEN, HOSP PERFORMED
Amphetamines: NOT DETECTED
Barbiturates: NOT DETECTED
Benzodiazepines: NOT DETECTED
Cocaine: POSITIVE — AB
Opiates: NOT DETECTED
Tetrahydrocannabinol: NOT DETECTED

## 2018-08-18 LAB — BASIC METABOLIC PANEL
Anion gap: 8 (ref 5–15)
BUN: 13 mg/dL (ref 6–20)
CO2: 27 mmol/L (ref 22–32)
Calcium: 9.3 mg/dL (ref 8.9–10.3)
Chloride: 102 mmol/L (ref 98–111)
Creatinine, Ser: 0.75 mg/dL (ref 0.44–1.00)
GFR calc Af Amer: >60 mL/min (ref >60)
GFR calc non Af Amer: >60 mL/min (ref >60)
Glucose, Bld: 273 mg/dL — ABNORMAL HIGH (ref 70–99)
Potassium: 3.8 mmol/L (ref 3.5–5.1)
Sodium: 137 mmol/L (ref 135–145)

## 2018-08-18 LAB — CBC
HCT: 37.7 % (ref 36.0–46.0)
Hemoglobin: 12.1 g/dL (ref 12.0–15.0)
MCH: 27.8 pg (ref 26.0–34.0)
MCHC: 32.1 g/dL (ref 30.0–36.0)
MCV: 86.7 fL (ref 80.0–100.0)
Platelets: 313 10*3/uL (ref 150–400)
RBC: 4.35 MIL/uL (ref 3.87–5.11)
RDW: 14 % (ref 11.5–15.5)
WBC: 4.5 10*3/uL (ref 4.0–10.5)
nRBC: 0 % (ref 0.0–0.2)

## 2018-08-18 LAB — ETHANOL: Alcohol, Ethyl (B): <10 mg/dL (ref <10)

## 2018-08-18 MED ORDER — ARIPIPRAZOLE 5 MG PO TABS
5.0000 mg | ORAL_TABLET | Freq: Every day | ORAL | Status: DC
  Administered 2018-08-18 – 2018-08-19 (×2): 5 mg via ORAL
  Filled 2018-08-18 (×2): qty 1

## 2018-08-18 MED ORDER — ACETAMINOPHEN 325 MG PO TABS: 650.0000 mg | ORAL_TABLET | ORAL | Status: DC | PRN

## 2018-08-18 MED ORDER — GABAPENTIN 300 MG PO CAPS
300.0000 mg | ORAL_CAPSULE | Freq: Three times a day (TID) | ORAL | Status: DC
  Administered 2018-08-18 – 2018-08-19 (×2): 300 mg via ORAL
  Filled 2018-08-18 (×3): qty 1

## 2018-08-18 MED ORDER — SERTRALINE HCL 50 MG PO TABS
25.0000 mg | ORAL_TABLET | Freq: Every day | ORAL | Status: DC
  Administered 2018-08-18 – 2018-08-19 (×2): 25 mg via ORAL
  Filled 2018-08-18 (×2): qty 1

## 2018-08-18 MED ORDER — ONDANSETRON HCL 4 MG PO TABS: 4.0000 mg | ORAL_TABLET | Freq: Three times a day (TID) | ORAL | Status: DC | PRN

## 2018-08-18 MED ORDER — ALUM & MAG HYDROXIDE-SIMETH 200-200-20 MG/5ML PO SUSP: 30.0000 mL | Freq: Four times a day (QID) | ORAL | Status: DC | PRN

## 2018-08-18 MED ORDER — METFORMIN HCL 500 MG PO TABS
500.0000 mg | ORAL_TABLET | Freq: Two times a day (BID) | ORAL | Status: DC
  Administered 2018-08-18 – 2018-08-19 (×2): 500 mg via ORAL
  Filled 2018-08-18 (×2): qty 1

## 2018-08-18 MED ORDER — LISINOPRIL 20 MG PO TABS
20.0000 mg | ORAL_TABLET | Freq: Every day | ORAL | Status: DC
  Administered 2018-08-18 – 2018-08-19 (×2): 20 mg via ORAL
  Filled 2018-08-18 (×2): qty 1
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
## 2018-08-18 NOTE — ED Notes (Signed)
Bed: [facility] Expected date:  Expected time:  Means of arrival:  Comments: Hold for hall c
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
## 2018-08-18 NOTE — ED Notes (Signed)
Patient has a depressed mood. Patient affect is consistent w/ moon. Patient is calm, cooperative, and anxious. Patient expresses having financial and emotional needs. Patient general appearance wasn't within normal limits, patient came in completely naked. Patient has disorganized thoughts and seems to be obsessed w/ things that occurred in the past. Patient makes statements of people being affected by the devil and the world being a bad place. Patient is having VH and AH.
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
## 2018-08-18 NOTE — ED Notes (Signed)
On admission to the Acute Unit pt was upset because she was hungry. She said, "I ain't white and I need something other than crackers and water. I eat fried chicken and potatoes."  She was satisfied with peanut butter crackers and ham sandwich, and 2 drinks.  She said, "I need something to clean my tail with. I can't even turn a trick because I stink. But I told um that there ain't nothing wrong with my mouth."  She was given shower supplies and said that she would take a shower she felt like it. She went to sleep.
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
## 2018-08-18 NOTE — ED Notes (Signed)
Temp 99.1. Pt is under multiple covers.

## 2018-08-18 NOTE — BH Assessment (Addendum)
[facility] Med Assessment Progress Note  Per Juanetta Beets, DO, this voluntary pt requires psychiatric hospitalization at this time.  The following facilities have been contacted to seek placement for this pt, with results as noted:  Beds available, information sent, decision pending:  [facility] [address] [facility]   Declined:  [facility] (not appropriate for their program due to substance abuse)   At capacity:  [facility] [place], [place] Behavioral Health Coordinator [phone]

## 2018-08-18 NOTE — ED Notes (Addendum)
Patient clothes, orange shoes, and ID card was put in a patients belongs bag.

## 2018-08-18 NOTE — BH Assessment (Signed)
[facility] Assessment Progress Note Case was staffed with Lord DNP who recommended a inpatient admission to assist with stabilization.       

## 2018-08-18 NOTE — ED Notes (Signed)
Bed: [facility] Expected date:  Expected time:  Means of arrival:  Comments: EMS si

## 2018-08-18 NOTE — ED Notes (Signed)
Pt alert and oriented, pt calm and cooperative. Pt c/o of si without a plan. Pt denies any Si or avh at this time. Pt currently taking a shower. Pt safe will continue to monitor.

## 2018-08-19 DIAGNOSIS — F332 Major depressive disorder, recurrent severe without psychotic features: Secondary | ICD-10-CM

## 2018-08-19 DIAGNOSIS — F1420 Cocaine dependence, uncomplicated: Secondary | ICD-10-CM

## 2018-08-19 DIAGNOSIS — I10 Essential (primary) hypertension: Secondary | ICD-10-CM

## 2018-08-19 DIAGNOSIS — F17210 Nicotine dependence, cigarettes, uncomplicated: Secondary | ICD-10-CM

## 2018-08-19 DIAGNOSIS — E119 Type 2 diabetes mellitus without complications: Secondary | ICD-10-CM

## 2018-08-19 MED ORDER — METFORMIN HCL 500 MG PO TABS: 500.0000 mg | ORAL_TABLET | Freq: Two times a day (BID) | ORAL | 0 refills | Status: DC

## 2018-08-19 MED ORDER — GABAPENTIN 300 MG PO CAPS: 300.0000 mg | ORAL_CAPSULE | Freq: Three times a day (TID) | ORAL | 0 refills | Status: DC

## 2018-08-19 MED ORDER — ARIPIPRAZOLE 5 MG PO TABS: 5.0000 mg | ORAL_TABLET | Freq: Every day | ORAL | 0 refills | Status: DC

## 2018-08-19 MED ORDER — SERTRALINE HCL 25 MG PO TABS: 25.0000 mg | ORAL_TABLET | Freq: Every day | ORAL | 0 refills | Status: DC

## 2018-08-19 MED ORDER — LISINOPRIL 20 MG PO TABS: 20.0000 mg | ORAL_TABLET | Freq: Every day | ORAL | 0 refills | Status: DC

## 2018-08-19 NOTE — BH Assessment (Signed)
[facility] Assessment Progress Note  Per Juanetta Beets, DO, this pt does not require psychiatric hospitalization at this time.  Pt is to be discharged from [facility] with referral information for [facility] in [place], and for [facility] in [place].  She is also to be provided with information regarding area supportive services for the homeless.  These resources have been included in pt's discharge instructions.  Pt's nurse, Morrie Sheldon, has been notified.  Doylene Canning, MA Triage Specialist [phone]

## 2018-08-19 NOTE — ED Notes (Signed)
Pt DCd off unit to home per MD. DC information reviewed and given to pt  With acknowledged understanding. Pt denied SI/HI. Denied A/V hallucinations.  Pt belongings given to pt. Pt ambulated off unit [facility], per [facility] the pt refused to change out of wine colored scrubs despite being offered blue scrubs and belongings.

## 2018-08-19 NOTE — Discharge Instructions (Signed)
For your mental health needs, you are advised to follow up one of the providers listed below.  Call them at your earliest opportunity to schedule an intake appointment:  [place]:       [facility]      [address]      [place], [place] [address]      [phone]      Crisis number: [phone]  [place] [facility]      [address]      [place], [place] [address]      [phone]  For your shelter needs, contact the following service providers:       [facility]      [address], [place] [address]      [phone]       [facility]      [address]      [place], [place] [address]      [phone]  For transitional housing, Pension scheme manager.  They provide longer term housing than a shelter, but there is an application process:       [facility]iday [facility] of [facility]      [address], [place] [address]      [phone]

## 2018-08-19 NOTE — Consult Note (Addendum)
[facility] Psych ED Discharge  08/19/2018 9:40 AM Sunee Mcconahy  MRN:  [id number] Principal Problem: Major depressive disorder, recurrent severe without psychotic features [facility]) Discharge Diagnoses: Principal Problem:   Major depressive disorder, recurrent severe without psychotic features (HCC) Active Problems:   Cocaine dependence [facility])  Subjective: 53 yo female who presented to the ED under the influence of cocaine after [place] train [place].  She started clearing the cocaine gradually and today she is clear and coherent with no suicidal/homicidal ideations, hallucinations, or withdrawal symptoms.  She has eaten heartily and slept well.  Requests to leave to return to work.  The stress of losing her IDs and other social issues facilitated her cocaine use with caused her to be depressed.  Support and medications decrease her cocaine use and depression.  2/10 anxiety today as she wants to leave and return to her routine.  Social work consult placed for resources and [place] for psychiatric care, Rx provided.  Stable for discharge.   Total Time spent with patient: 45 minutes  Past Psychiatric History: substance abuse, depression  Past Medical History:  Past Medical History:  Diagnosis Date  . Diabetes mellitus without complication (HCC)   . GERD (gastroesophageal reflux disease)   . Hypertension   . PTSD (post-traumatic stress disorder)   . Schizo affective schizophrenia [facility])     Past Surgical History:  Procedure Laterality Date  . SKIN GRAFT     Family History: History reviewed. No pertinent family history. Family Psychiatric  History: none Social History:  Social History   Substance and Sexual Activity  Alcohol Use Not Currently  . Frequency: Never   Comment: BAC not available at time of assessment     Social History   Substance and Sexual Activity  Drug Use Yes  . Frequency: 7.0 times per week  . Types: Cocaine, "Crack" cocaine   Comment: UDS not available at time of  assessment    Social History   Socioeconomic History  . Marital status: Single    Spouse name: Not on file  . Number of children: Not on file  . Years of education: Not on file  . Highest education level: Not on file  Occupational History  . Occupation: Prostitute  Social Needs  . Financial resource strain: Not on file  . Food insecurity:    Worry: Not on file    Inability: Not on file  . Transportation needs:    Medical: Not on file    Non-medical: Not on file  Tobacco Use  . Smoking status: Current Every Day Smoker    Packs/day: 1.50  . Smokeless tobacco: Never Used  Substance and Sexual Activity  . Alcohol use: Not Currently    Frequency: Never    Comment: BAC not available at time of assessment  . Drug use: Yes    Frequency: 7.0 times per week    Types: Cocaine, "Crack" cocaine    Comment: UDS not available at time of assessment  . Sexual activity: Yes  Lifestyle  . Physical activity:    Days per week: Not on file    Minutes per session: Not on file  . Stress: Not on file  Relationships  . Social connections:    Talks on phone: Not on file    Gets together: Not on file    Attends religious service: Not on file    Active member of club or organization: Not on file    Attends meetings of clubs or organizations: Not on  file    Relationship status: Not on file  Other Topics Concern  . 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    Has this patient used any form of tobacco in the last 30 days? (Cigarettes, Smokeless Tobacco, Cigars, and/or Pipes) A prescription for an FDA-approved tobacco cessation medication was offered at discharge and the patient refused  Current Medications: Current Facility-Administered Medications  Medication Dose Route Frequency Provider Last Rate Last Dose  . acetaminophen (TYLENOL) tablet 650 mg  650 mg Oral Q4H PRN Azalia Bilis, MD      . alum  & mag hydroxide-simeth (MAALOX/MYLANTA) 200-200-20 MG/5ML suspension 30 mL  30 mL Oral Q6H PRN Azalia Bilis, MD      . ARIPiprazole (ABILIFY) tablet 5 mg  5 mg Oral Daily Charm Rings, NP   5 mg at 08/19/18 0911  . gabapentin (NEURONTIN) capsule 300 mg  300 mg Oral TID Charm Rings, NP   300 mg at 08/19/18 0911  . lisinopril (PRINIVIL,ZESTRIL) tablet 20 mg  20 mg Oral Daily Charm Rings, NP   20 mg at 08/19/18 0911  . metFORMIN (GLUCOPHAGE) tablet 500 mg  500 mg Oral BID WC Charm Rings, NP   500 mg at 08/19/18 0911  . ondansetron (ZOFRAN) tablet 4 mg  4 mg Oral Q8H PRN Azalia Bilis, MD      . sertraline (ZOLOFT) tablet 25 mg  25 mg Oral Daily Charm Rings, NP   25 mg at 08/19/18 1610   Current Outpatient Medications  Medication Sig Dispense Refill  . metFORMIN (GLUCOPHAGE) 500 MG tablet Take 1 tablet (500 mg total) by mouth 2 (two) times daily with a meal. (Patient not taking: Reported on 08/19/2018) 60 tablet 0   PTA Medications: (Not in a hospital admission)   Musculoskeletal: Strength & Muscle Tone: within normal limits Gait & Station: normal Patient leans: N/A  Psychiatric Specialty Exam: Physical Exam  Nursing note and vitals reviewed. Constitutional: She is oriented to person, place, and time. She appears well-developed and well-nourished.  HENT:  Head: Normocephalic.  Neck: Normal range of motion.  Respiratory: Effort normal.  Musculoskeletal: Normal range of motion.  Neurological: She is alert and oriented to person, place, and time.  Psychiatric: Her speech is normal and behavior is normal. Judgment and thought content normal. Her mood appears anxious. Her affect is blunt. Cognition and memory are normal.    Review of Systems  Psychiatric/Behavioral: Positive for substance abuse. The patient is nervous/anxious.   All other systems reviewed and are negative.   Blood pressure 134/86, pulse 87, temperature 98.1 F (36.7 C), temperature source Oral, resp.  rate 18, height  (1.753 m), weight 106.1 kg, SpO2 97 %.Body mass index is 34.56 kg/m.  General Appearance: Casual  Eye Contact:  Good  Speech:  Normal Rate  Volume:  Normal  Mood:  Anxious  Affect:  Blunt  Thought Process:  Coherent and Descriptions of Associations: Intact  Orientation:  Full (Time, Place, and Person)  Thought Content:  WDL and Logical  Suicidal Thoughts:  No  Homicidal Thoughts:  No  Memory:  Immediate;   Good Recent;   Good Remote;   Good  Judgement:  Fair  Insight:  Fair  Psychomotor Activity:  Normal  Concentration:  Concentration: Good and Attention Span: Good  Recall:  Good  Fund of Knowledge:  Fair  Language:  Good  Akathisia:  No  Handed:  Right  AIMS (if indicated):   N/A  Assets:  Leisure Time Physical Health Resilience Social Support  ADL's:  Intact  Cognition:  WNL  Sleep:   N/A     Demographic Factors:  NA  Loss Factors: NA  Historical Factors: NA  Risk Reduction Factors:   Sense of responsibility to family, Living with another person, especially a relative and Positive social support  Continued Clinical Symptoms:  Anxiety, mild  Cognitive Features That Contribute To Risk:  None    Suicide Risk:  Minimal: No identifiable suicidal ideation.  Patients presenting with no risk factors but with morbid ruminations; may be classified as minimal risk based on the severity of the depressive symptoms  Plan Of Care/Follow-up recommendations:  Cocaine abuse with cocaine induced mood disorder: -Continued Zoloft 25 mg daily -Continued Abilify 5 mg daily -Continued Gabapentin 300 mg TID -Social work consult placed  HTN: -Continued lisinopril 20 mg daily  DMII: -Continued glucophage 500 mg BID  Disposition: Discharge home Nanine MeansLORD, JAMISON, NP 08/19/2018, 9:40 AM   Patient seen face-to-face for psychiatric evaluation, chart reviewed and case discussed with the physician extender and developed treatment plan. Reviewed the  information documented and agree with the treatment plan.  Juanetta BeetsJacqueline Ermin Parisien, DO 08/19/18 12:49 PM

## 2018-08-24 ENCOUNTER — Encounter (HOSPITAL_COMMUNITY): Payer: Self-pay

## 2018-08-24 ENCOUNTER — Emergency Department (HOSPITAL_COMMUNITY)
Admission: EM | Admit: 2018-08-24 | Discharge: 2018-08-24 | Disposition: A | Discharge status: Home / Self Care | Payer: Medicaid Other | Attending: Emergency Medicine | Admitting: Emergency Medicine

## 2018-08-24 ENCOUNTER — Other Ambulatory Visit: Payer: Self-pay

## 2018-08-24 DIAGNOSIS — I10 Essential (primary) hypertension: Secondary | ICD-10-CM | POA: Diagnosis not present

## 2018-08-24 DIAGNOSIS — F419 Anxiety disorder, unspecified: Secondary | ICD-10-CM | POA: Diagnosis not present

## 2018-08-24 DIAGNOSIS — E119 Type 2 diabetes mellitus without complications: Secondary | ICD-10-CM | POA: Insufficient documentation

## 2018-08-24 DIAGNOSIS — F258 Other schizoaffective disorders: Secondary | ICD-10-CM | POA: Insufficient documentation

## 2018-08-24 DIAGNOSIS — F17200 Nicotine dependence, unspecified, uncomplicated: Secondary | ICD-10-CM | POA: Insufficient documentation

## 2018-08-24 DIAGNOSIS — Z91040 Latex allergy status: Secondary | ICD-10-CM | POA: Insufficient documentation

## 2018-08-24 DIAGNOSIS — Z760 Encounter for issue of repeat prescription: Secondary | ICD-10-CM | POA: Diagnosis present

## 2018-08-24 NOTE — Discharge Instructions (Addendum)
Follow-up at [facility] next week °

## 2018-08-24 NOTE — ED Provider Notes (Signed)
[facility]-EMERGENCY DEPT Provider Note   CSN: [id number] Arrival date & time: 08/24/18  1235    History   Chief Complaint Chief Complaint  Patient presents with   Medication Refill    HPI Katherine Franklin is a 53 y.o. female.     This is a 53 year old female with a history of schizoaffective disorder presents requesting her psychiatric medications which she has been out of for the past several months.  [facility] and she states that due to her identity theft problems she has been unable to get them filled.  She denies any suicidal or homicidal ideations.  Denies responding to internal stimuli.  [place] and the patient states that she was only changing her clothes.  Brought in by [employer].     Past Medical History:  Diagnosis Date   Diabetes mellitus without complication (HCC)    GERD (gastroesophageal reflux disease)    Hypertension    PTSD (post-traumatic stress disorder)    Schizo affective schizophrenia [facility])     Patient Active Problem List   Diagnosis Date Noted   Cocaine dependence (HCC) 08/18/2018   Major depressive disorder, recurrent severe without psychotic features (HCC) 08/18/2018    Past Surgical History:  Procedure Laterality Date   SKIN GRAFT       OB History   No obstetric history on file.      Home Medications    Prior to Admission medications   Medication Sig Start Date End Date Taking? Authorizing Provider  ARIPiprazole (ABILIFY) 5 MG tablet Take 1 tablet (5 mg total) by mouth daily. 08/20/18   Charm Rings, NP  gabapentin (NEURONTIN) 300 MG capsule Take 1 capsule (300 mg total) by mouth 3 (three) times daily. 08/19/18   Charm Rings, NP  lisinopril (PRINIVIL,ZESTRIL) 20 MG tablet Take 1 tablet (20 mg total) by mouth daily. 08/20/18   Charm Rings, NP  metFORMIN (GLUCOPHAGE) 500 MG tablet Take 1 tablet (500 mg total) by mouth 2  (two) times daily with a meal. Patient not taking: Reported on 08/19/2018 08/04/18   Virgina Norfolk, DO  metFORMIN (GLUCOPHAGE) 500 MG tablet Take 1 tablet (500 mg total) by mouth 2 (two) times daily with a meal. 08/19/18   Charm Rings, NP  sertraline (ZOLOFT) 25 MG tablet Take 1 tablet (25 mg total) by mouth daily. 08/20/18   Charm Rings, NP    Family History History reviewed. No pertinent family history.  Social History Social History   Tobacco Use   Smoking status: Current Every Day Smoker    Packs/day: 1.50   Smokeless tobacco: Never Used  Substance Use Topics   Alcohol use: Not Currently    Frequency: Never    Comment: BAC not available at time of assessment   Drug use: Yes    Frequency: 7.0 times per week    Types: Cocaine, "Crack" cocaine    Comment: UDS not available at time of assessment     Allergies   Sulfa antibiotics and Latex   Review of Systems Review of Systems  All other systems reviewed and are negative.    Physical Exam Updated Vital Signs BP (!) 165/106 (BP Location: Right Arm)    Pulse 94    Temp 98 F (36.7 C) (Oral)    Resp 18    Ht 1.753 m (5\' 9" )    Wt 106 kg    SpO2  100%    BMI 34.51 kg/m   Physical Exam Vitals signs and nursing note reviewed.  Constitutional:      General: She is not in acute distress.    Appearance: Normal appearance. She is well-developed. She is not toxic-appearing.  HENT:     Head: Normocephalic and atraumatic.  Eyes:     General: Lids are normal.     Conjunctiva/sclera: Conjunctivae normal.     Pupils: Pupils are equal, round, and reactive to light.  Neck:     Musculoskeletal: Normal range of motion and neck supple.     Thyroid: No thyroid mass.     Trachea: No tracheal deviation.  Cardiovascular:     Rate and Rhythm: Normal rate and regular rhythm.     Heart sounds: Normal heart sounds. No murmur. No gallop.   Pulmonary:     Effort: Pulmonary effort is normal. No respiratory distress.     Breath  sounds: Normal breath sounds. No stridor. No decreased breath sounds, wheezing, rhonchi or rales.  Abdominal:     General: Bowel sounds are normal. There is no distension.     Palpations: Abdomen is soft.     Tenderness: There is no abdominal tenderness. There is no rebound.  Musculoskeletal: Normal range of motion.        General: No tenderness.  Skin:    General: Skin is warm and dry.     Findings: No abrasion or rash.  Neurological:     Mental Status: She is alert and oriented to person, place, and time.     GCS: GCS eye subscore is 4. GCS verbal subscore is 5. GCS motor subscore is 6.     Cranial Nerves: No cranial nerve deficit.     Sensory: No sensory deficit.  Psychiatric:        Attention and Perception: Attention normal.        Mood and Affect: Mood is anxious.        Speech: Speech normal.        Behavior: Behavior normal. Behavior is cooperative.        Thought Content: Thought content does not include homicidal or suicidal ideation.      ED Treatments / Results  Labs (all labs ordered are listed, but only abnormal results are displayed) Labs Reviewed - No data to display  EKG None  Radiology No results found.  Procedures Procedures (including critical care time)  Medications Ordered in ED Medications - No data to display   Initial Impression / Assessment and Plan / ED Course  I have reviewed the triage vital signs and the nursing notes.  Pertinent labs & imaging results that were available during my care of the patient were reviewed by me and considered in my medical decision making (see chart for details).        Patient does not appear to have any acute psychiatric condition at this time.  Appears to be at her baseline.  Encouraged to follow-up at [facility] for her medications needs  Final Clinical Impressions(s) / ED Diagnoses   Final diagnoses:  None    ED Discharge Orders    None       Lorre NickAllen, Moses Odoherty, MD 08/24/18 1308

## 2018-08-24 NOTE — ED Triage Notes (Signed)
Pt BIB by [employer]. Pt reports she has been out of her psych medications for months now. Pt reports that she is having trouble filling prescriptions.

## 2018-08-27 ENCOUNTER — Emergency Department (HOSPITAL_COMMUNITY)
Admission: EM | Admit: 2018-08-27 | Discharge: 2018-08-27 | Discharge status: Against Medical Advice | Payer: Medicaid Other

## 2018-08-27 NOTE — ED Triage Notes (Signed)
Pt brought in by [facility] d/t being homeless.  Pt refusing to have VS, went to the BR and then left stating she was leaving.

## 2018-09-01 ENCOUNTER — Emergency Department (HOSPITAL_COMMUNITY)
Admission: EM | Admit: 2018-09-01 | Discharge: 2018-09-01 | Disposition: A | Discharge status: Home / Self Care | Payer: Medicaid Other | Attending: Emergency Medicine | Admitting: Emergency Medicine

## 2018-09-01 DIAGNOSIS — F17200 Nicotine dependence, unspecified, uncomplicated: Secondary | ICD-10-CM | POA: Diagnosis not present

## 2018-09-01 DIAGNOSIS — E119 Type 2 diabetes mellitus without complications: Secondary | ICD-10-CM | POA: Diagnosis not present

## 2018-09-01 DIAGNOSIS — Z7984 Long term (current) use of oral hypoglycemic drugs: Secondary | ICD-10-CM | POA: Insufficient documentation

## 2018-09-01 DIAGNOSIS — Z91040 Latex allergy status: Secondary | ICD-10-CM | POA: Insufficient documentation

## 2018-09-01 DIAGNOSIS — Z23 Encounter for immunization: Secondary | ICD-10-CM | POA: Insufficient documentation

## 2018-09-01 DIAGNOSIS — W450XXA Nail entering through skin, initial encounter: Secondary | ICD-10-CM | POA: Insufficient documentation

## 2018-09-01 DIAGNOSIS — S99922A Unspecified injury of left foot, initial encounter: Secondary | ICD-10-CM | POA: Diagnosis present

## 2018-09-01 DIAGNOSIS — Y999 Unspecified external cause status: Secondary | ICD-10-CM | POA: Insufficient documentation

## 2018-09-01 DIAGNOSIS — Z590 Homelessness: Secondary | ICD-10-CM | POA: Diagnosis not present

## 2018-09-01 DIAGNOSIS — Y9289 Other specified places as the place of occurrence of the external cause: Secondary | ICD-10-CM | POA: Insufficient documentation

## 2018-09-01 DIAGNOSIS — Y9301 Activity, walking, marching and hiking: Secondary | ICD-10-CM | POA: Insufficient documentation

## 2018-09-01 DIAGNOSIS — I10 Essential (primary) hypertension: Secondary | ICD-10-CM | POA: Diagnosis not present

## 2018-09-01 DIAGNOSIS — S91332A Puncture wound without foreign body, left foot, initial encounter: Secondary | ICD-10-CM | POA: Insufficient documentation

## 2018-09-01 DIAGNOSIS — T148XXA Other injury of unspecified body region, initial encounter: Secondary | ICD-10-CM

## 2018-09-01 DIAGNOSIS — Z79899 Other long term (current) drug therapy: Secondary | ICD-10-CM | POA: Diagnosis not present

## 2018-09-01 MED ORDER — CIPROFLOXACIN HCL 500 MG PO TABS
500.0000 mg | ORAL_TABLET | Freq: Once | ORAL | Status: AC
  Administered 2018-09-01: 500 mg via ORAL
  Filled 2018-09-01: qty 1

## 2018-09-01 MED ORDER — CIPROFLOXACIN HCL 500 MG PO TABS: 500.0000 mg | ORAL_TABLET | Freq: Two times a day (BID) | ORAL | 0 refills | Status: DC

## 2018-09-01 MED ORDER — TETANUS-DIPHTH-ACELL PERTUSSIS 5-2.5-18.5 LF-MCG/0.5 IM SUSP
0.5000 mL | Freq: Once | INTRAMUSCULAR | Status: AC
  Administered 2018-09-01: 0.5 mL via INTRAMUSCULAR
  Filled 2018-09-01: qty 0.5

## 2018-09-01 NOTE — ED Triage Notes (Signed)
Pt stepped on a nail while in an abandoned drug house.  Pt reports using crack as well

## 2018-09-01 NOTE — Discharge Instructions (Signed)
Take the prescribed medication as directed.  Try to keep foot clean, monitor for any redness, swelling, drainage, fever, etc. Follow-up with your primary care doctor. Return to the ED for new or worsening symptoms.

## 2018-09-01 NOTE — ED Provider Notes (Signed)
[facility]-EMERGENCY DEPT Provider Note   CSN: [id number] Arrival date & time: 09/01/18  0301    History   Chief Complaint Chief Complaint  Patient presents with  . Foot Injury    HPI Katherine Franklin is a 53 y.o. female.     The history is provided by the patient and medical records.  Foot Injury    53 y.o. F with hx of DM, GERD, HTN, PTSD, presenting to the ED for left foot pain.  Patient reports she is currently homeless and every night she finds an abandoned building to sleep in.  States tonight she walked into an abandoned house to get out of the rain and stepped on a rusty nail.  She has puncture wound to left heel.  Unsure of last tetanus.  She is a diabetic.  States her sugar was 300 earlier today which is good for her.  Past Medical History:  Diagnosis Date  . Diabetes mellitus without complication (HCC)   . GERD (gastroesophageal reflux disease)   . Hypertension   . PTSD (post-traumatic stress disorder)   . Schizo affective schizophrenia [facility](HCC)     Patient Active Problem List   Diagnosis Date Noted  . Cocaine dependence (HCC) 08/18/2018  . Major depressive disorder, recurrent severe without psychotic features (HCC) 08/18/2018    Past Surgical History:  Procedure Laterality Date  . SKIN GRAFT       OB History   No obstetric history on file.      Home Medications    Prior to Admission medications   Medication Sig Start Date End Date Taking? Authorizing Provider  ARIPiprazole (ABILIFY) 5 MG tablet Take 1 tablet (5 mg total) by mouth daily. 08/20/18   Charm RingsLord, Jamison Y, NP  gabapentin (NEURONTIN) 300 MG capsule Take 1 capsule (300 mg total) by mouth 3 (three) times daily. 08/19/18   Charm RingsLord, Jamison Y, NP  lisinopril (PRINIVIL,ZESTRIL) 20 MG tablet Take 1 tablet (20 mg total) by mouth daily. 08/20/18   Charm RingsLord, Jamison Y, NP  metFORMIN (GLUCOPHAGE) 500 MG tablet Take 1 tablet (500 mg total) by mouth 2 (two) times daily with a meal. Patient not  taking: Reported on 08/19/2018 08/04/18   Virgina Norfolkuratolo, Adam, DO  metFORMIN (GLUCOPHAGE) 500 MG tablet Take 1 tablet (500 mg total) by mouth 2 (two) times daily with a meal. 08/19/18   Charm RingsLord, Jamison Y, NP  sertraline (ZOLOFT) 25 MG tablet Take 1 tablet (25 mg total) by mouth daily. 08/20/18   Charm RingsLord, Jamison Y, NP    Family History No family history on file.  Social History Social History   Tobacco Use  . Smoking status: Current Every Day Smoker    Packs/day: 1.50  . Smokeless tobacco: Never Used  Substance Use Topics  . Alcohol use: Not Currently    Frequency: Never    Comment: BAC not available at time of assessment  . Drug use: Yes    Frequency: 7.0 times per week    Types: Cocaine, "Crack" cocaine    Comment: UDS not available at time of assessment     Allergies   Sulfa antibiotics and Latex   Review of Systems Review of Systems  Skin: Positive for wound.  All other systems reviewed and are negative.    Physical Exam Updated Vital Signs BP (!) 152/89   Pulse 88   Temp 98.2 F (36.8 C)   Resp 17   SpO2 99%   Physical Exam Vitals signs and nursing note reviewed.  Constitutional:      Appearance: She is well-developed.  HENT:     Head: Normocephalic and atraumatic.  Eyes:     Conjunctiva/sclera: Conjunctivae normal.     Pupils: Pupils are equal, round, and reactive to light.  Neck:     Musculoskeletal: Normal range of motion.  Cardiovascular:     Rate and Rhythm: Normal rate and regular rhythm.     Heart sounds: Normal heart sounds.  Pulmonary:     Effort: Pulmonary effort is normal.     Breath sounds: Normal breath sounds.  Abdominal:     General: Bowel sounds are normal.     Palpations: Abdomen is soft.  Musculoskeletal: Normal range of motion.     Comments: Feet are dirty and wet Puncture wound noted to left heel, there is dried blood surrounding but no active bleeding, no drainage or overlying signs of infection  Skin:    General: Skin is warm and dry.   Neurological:     Mental Status: She is alert and oriented to person, place, and time.      ED Treatments / Results  Labs (all labs ordered are listed, but only abnormal results are displayed) Labs Reviewed - No data to display  EKG None  Radiology No results found.  Procedures Procedures (including critical care time)  Medications Ordered in ED Medications  Tdap (BOOSTRIX) injection 0.5 mL (0.5 mLs Intramuscular Given 09/01/18 0314)  ciprofloxacin (CIPRO) tablet 500 mg (500 mg Oral Given 09/01/18 0314)     Initial Impression / Assessment and Plan / ED Course  I have reviewed the triage vital signs and the nursing notes.  Pertinent labs & imaging results that were available during my care of the patient were reviewed by me and considered in my medical decision making (see chart for details).  53 y.o. F here with left foot pain.  Was in an abandoned house and stepped on a rusty nail in the dark.  Has puncture wound to left heel.  No active bleeding or signs of infection.  Tetanus updated here.  She is type II diabetic, sugar this past afternoon was 300 per her report which is good for her (states she usually runs higher than this).  Will start on abx as she is high risk for infection.  Monitor sugars and for signs of infection on foot.  Can follow-up with PCP.  Return here for any new/acute changes.  Final Clinical Impressions(s) / ED Diagnoses   Final diagnoses:  Puncture wound    ED Discharge Orders         Ordered    ciprofloxacin (CIPRO) 500 MG tablet  2 times daily     09/01/18 0325           Garlon Hatchet, PA-C 09/01/18 3903    Zadie Rhine, MD 09/01/18 [phone]

## 2018-09-02 ENCOUNTER — Emergency Department (HOSPITAL_COMMUNITY)
Admission: EM | Admit: 2018-09-02 | Discharge: 2018-09-02 | Disposition: A | Discharge status: Home / Self Care | Payer: Medicaid Other | Attending: Emergency Medicine | Admitting: Emergency Medicine

## 2018-09-02 ENCOUNTER — Encounter (HOSPITAL_COMMUNITY): Payer: Self-pay

## 2018-09-02 ENCOUNTER — Other Ambulatory Visit: Payer: Self-pay

## 2018-09-02 DIAGNOSIS — R462 Strange and inexplicable behavior: Secondary | ICD-10-CM | POA: Diagnosis present

## 2018-09-02 DIAGNOSIS — E119 Type 2 diabetes mellitus without complications: Secondary | ICD-10-CM | POA: Diagnosis not present

## 2018-09-02 DIAGNOSIS — Z91040 Latex allergy status: Secondary | ICD-10-CM | POA: Diagnosis not present

## 2018-09-02 DIAGNOSIS — Z5900 Homelessness unspecified: Secondary | ICD-10-CM

## 2018-09-02 DIAGNOSIS — F17210 Nicotine dependence, cigarettes, uncomplicated: Secondary | ICD-10-CM | POA: Diagnosis not present

## 2018-09-02 NOTE — ED Notes (Signed)
Bed: [facility]4 Expected date:  Expected time:  Means of arrival:  Comments: 

## 2018-09-02 NOTE — ED Provider Notes (Signed)
Emergency Department Provider Note   I have reviewed the triage vital signs and the nursing notes.   HISTORY  Chief Complaint Psychiatric Evaluation   HPI Katherine Franklin is a 53 y.o. female with PMH of homelessness, DM, Schizoaffective disorder, and PTSD presents to the emergency department for evaluation after acting in a bizarre way in public.  She states that she went to [employer] to try and get something to eat.  She was unable to get any food for free so she reports going out into the road and lying down.  She states that she was not feeling suicidal [place].  She is not experiencing any auditory or visual hallucinations.  Patient states that she was hungry and she was using this as a way to manipulate others into getting her food.  She states that a Emergency planning/management officer was called who got her some chicken and and drove her to the emergency department. She reports using drugs but not today. She sleeps behind a gas station. Denies any medical complaints.   Past Medical History:  Diagnosis Date  . Diabetes mellitus without complication (HCC)   . GERD (gastroesophageal reflux disease)   . Hypertension   . PTSD (post-traumatic stress disorder)   . Schizo affective schizophrenia [facility])     Patient Active Problem List   Diagnosis Date Noted  . Cocaine dependence (HCC) 08/18/2018  . Major depressive disorder, recurrent severe without psychotic features (HCC) 08/18/2018    Past Surgical History:  Procedure Laterality Date  . SKIN GRAFT      Allergies Sulfa antibiotics and Latex  Family History  Family history unknown: Yes    Social History Social History   Tobacco Use  . Smoking status: Current Every Day Smoker    Packs/day: 1.50  . Smokeless tobacco: Never Used  Substance Use Topics  . Alcohol use: Not Currently    Frequency: Never    Comment: BAC not available at time of assessment  . Drug use: Yes    Frequency: 7.0 times per week    Types: Cocaine, "Crack"  cocaine    Comment: UDS not available at time of assessment    Review of Systems  Constitutional: No fever/chills Eyes: No visual changes. ENT: No sore throat. Cardiovascular: Denies chest pain. Respiratory: Denies shortness of breath. Gastrointestinal: No abdominal pain.  No nausea, no vomiting.  No diarrhea.  No constipation. Genitourinary: Negative for dysuria. Musculoskeletal: Negative for back pain. Skin: Negative for rash. Neurological: Negative for headaches, focal weakness or numbness. Psychiatric: No SI/HI.   10-point ROS otherwise negative.  ____________________________________________   PHYSICAL EXAM:  VITAL SIGNS: ED Triage Vitals  Enc Vitals Group     BP 09/02/18 1644 118/88     Pulse Rate 09/02/18 1644 (!) 101     Resp 09/02/18 1644 16     Temp 09/02/18 1644 98.6 F (37 C)     Temp Source 09/02/18 1644 Oral     SpO2 09/02/18 1644 97 %     Weight 09/02/18 1652 233 lb 11 oz (106 kg)     Height 09/02/18 1652 5\' 9"  (1.753 m)     Pain Score 09/02/18 1652 0   Constitutional: Alert and oriented. Well appearing and in no acute distress. Eyes: Conjunctivae are normal. Head: Atraumatic. Nose: No congestion/rhinnorhea. Mouth/Throat: Mucous membranes are moist.  Neck: No stridor.  Cardiovascular: Normal rate, regular rhythm.  Respiratory: Normal respiratory effort.   Gastrointestinal: No distention.  Musculoskeletal:  No gross deformities of extremities.  Neurologic:  Normal speech and language.  Skin:  Skin is warm, dry and intact.  Psychiatric: Mood and affect are normal. Patient is speaking quickly but holds a conversation, has good insight into her behavior including reasons for doing it, and is not tangential.   ____________________________________________   PROCEDURES  Procedure(s) performed:   Procedures  None  ____________________________________________   INITIAL IMPRESSION / ASSESSMENT AND PLAN / ED COURSE  Pertinent labs & imaging  results that were available during my care of the patient were reviewed by me and considered in my medical decision making (see chart for details).   Appears to be at her mental status baseline.  She has history of homelessness and drug use. She was laying in the road in order to get food. Denies any SI/HI. Does not appear intoxicated. Affect seems similar to what is described in prior ED notes. Feel that patient is safe for discharge. Had psych meds refilled recently through the ED. Provided list of community resources. Will discharge.    ____________________________________________  FINAL CLINICAL IMPRESSION(S) / ED DIAGNOSES  Final diagnoses:  Homelessness    Note:  This document was prepared using Dragon voice recognition software and may include unintentional dictation errors.  Alona BeneJoshua Calleigh Lafontant, MD Emergency Medicine    Caelin Rosen, Arlyss RepressJoshua G, MD 09/02/18 [phone]

## 2018-09-02 NOTE — Discharge Instructions (Signed)
Substance Abuse Treatment Programs ° °Intensive Outpatient Programs °[facility]     °[address]      °[place], [place]                   °[phone]      ° °[facility] °[address] °[place], [place] °[phone] ° °[facility]     °(Inpatient and outpatient)     °[address]           °[phone]   ° °[facility] °[phone] (Suboxone and Methadone) ° °[address]      °[place], [place] [address]      °[phone]      ° °[address] °[place], [place] °852-3033 ° °[facility] (Outpatient/Inpatient, [facility])    °(insurance only) [phone]      °       °[facility] (Groups & Residential) °[place], [place] °[phone] ° °   °[facility]     °[address]     °[place], [place]      °[phone]      ° °[facility] (for caregivers and family) °[address] °[place], [place] °336-299-4655 ° ° ° ° ° °Residential Treatment Programs °[facility]      °[address], [place], [place] [address]  °[phone]      ° °[facility] °[address] [place], [place] [address] °[phone] ° °[facility]        °[phone]      ° °[facility] °[phone] ° °[facility]             °[address]                                         °[place], [place]                                                °877-615-2722 or 336-784-9470                              ° °[facility] [place] °[address] °[place], [address] °[phone] ° °[facility]    °[address]      °[place], [place]     °[phone]      ° °[facility] °[address] °[place], [place] °336-285-9073 ° °[facility]   °[address]     °[place], [place] [address]     °[phone]      °Admissions: 8am-3pm M-F ° °[facility] °[address] °[place] °[phone] ° °[facility]: Residential Program (90 Days)   °[place], [place]      °336-725-8389 or 800-758-6077    ° °[facility] °[place], [place] °(Walk in Hours over the weekend or by referral) ° °[facility] °[address], [place], [place] [address] °([phone] ° °Crisis Mobile: [facility]:  1-877-626-1772 (for crisis response 24 hours a day) °[facility] Hotline:      1-800-256-2452 °Outpatient Psychiatry and Counseling ° °[facility]: Mobile Crisis   Management 24 hours:  1-877-626-1772 ° °[facility] sliding scale fee and walk in schedule: M-F 8am-12pm/1pm-3pm °[address]  °[place], [place] [address] °[phone] ° °[facility] °[address] °[place], [place] [address] °[phone] ° °[facility] ([facility])- new patient walk-in appointments available Monday - Friday 8am -3pm.          °[address] °[place], [place] [address] °336-676-6840 or crisis line- 336-676-6905 ° °[facility] Intensive Outpatient Therapy Program °[address] °[place], [place] [address] °336-832-9804 ° °[facility]                  °Crisis Services      °[phone]      °[address]     °[place], [place] [address]                ° °[facility]   °[facility] °[phone] °[address] °[place], [place] [address] ° ° °[facility]          °[address]  °[place], [place] [address]       °[phone] ° °[facility] °[address] °[place], [place] [address] °336-292-1510 ° °[facility]    °[address]    °[place], [place] [address]     °[phone]      ° °Parish McKinney, MD     °[address]     °[place] [address]     °336-282-1251     °  °[facility] °[address] °[place] [address] ° °[facility]     °[address]     °[place], [place]      °336-542-2076      ° °[facility] °Shamsher Ahluwalia, MD °[address] °[place], [place] [address] °[phone] ° °[facility]     °[address]     °[place], [place] [address]     °336-274-1237      ° °[facility] °[address] °[place], [place] [address] °336-854-4450 °Resources for Temporary Residential Assistance/Crisis Centers ° °DAY CENTERS °[facility] °M-F 8am-3pm   °[address] [place], [place] [address]   336-332-0824 °Services include: laundry, barbering, support groups, case management, phone  & computer access, showers, AA/NA mtgs, mental health/substance abuse nurse, job skills class, disability information, VA assistance, spiritual classes, etc.  ° °HOMELESS SHELTERS ° °[facility]     °[facility]   °[address], [place]     °[phone]       °       °[facility] (women and children)       °[address] °[place], [place] [address] °336-275-0820 °[email or web address] for application and process °Application Required ° °[facility]   °[address]    °[place] [address]     °[phone]       °             °[facility] °[address] °[place], [place] [address] °336.273.5572 °336-235-0363(schedule application appt.) °Application Required ° °[facility] (women only)    °[address]     °[place], [place] [address]     °336-884-1039      °  Intake starts 6pm daily °Need valid ID, SSC, & Police report °[facility] [place] °[address] °[place], [place] °336-881-5420 °Application Required ° °[facility] (men only)     °[address]      °[place], [place]     °[phone]      ° °[facility] °(Pregnant women only) °[address] °[place], [place] °336-275-0206 ° °[facility]      °[address]      °[place], [place] [address]     °[phone]      °       °[facility] °[address] °[place], [place] °336-723-1848 °90 day commitment/SA/Application process ° °[facility](men only)     °[address]     °[place], [place]     °[phone]       °Check-in at 7pm     °       °[facility] °[address] °[place], [place] [address] °336-248-6684 °Men/Women/Women and Children must be there by 7 pm ° °[facility] °[place], [place] °336-722-8721                ° °

## 2018-09-02 NOTE — ED Triage Notes (Signed)
Patient states "I've been living on a sidewalk and I was trying to get some food at [employer] and they would not. So I laid face down. I know how to get some food." A police officer came and bought me some chicken and brought me here. Patient states, "There is nothing wrong with me. I am just hungry and homeless."  Patient denies SI/HI, drug or alcohol use. Patient denies any hallucinations.

## 2018-09-09 ENCOUNTER — Encounter (HOSPITAL_COMMUNITY): Payer: Self-pay

## 2018-09-09 ENCOUNTER — Emergency Department (HOSPITAL_COMMUNITY)
Admission: EM | Admit: 2018-09-09 | Discharge: 2018-09-09 | Disposition: A | Discharge status: Home / Self Care | Payer: Medicaid Other | Attending: Emergency Medicine | Admitting: Emergency Medicine

## 2018-09-09 ENCOUNTER — Other Ambulatory Visit: Payer: Self-pay

## 2018-09-09 DIAGNOSIS — I10 Essential (primary) hypertension: Secondary | ICD-10-CM | POA: Diagnosis not present

## 2018-09-09 DIAGNOSIS — Z5900 Homelessness unspecified: Secondary | ICD-10-CM

## 2018-09-09 DIAGNOSIS — Z7984 Long term (current) use of oral hypoglycemic drugs: Secondary | ICD-10-CM | POA: Insufficient documentation

## 2018-09-09 DIAGNOSIS — E119 Type 2 diabetes mellitus without complications: Secondary | ICD-10-CM | POA: Diagnosis not present

## 2018-09-09 DIAGNOSIS — Z79899 Other long term (current) drug therapy: Secondary | ICD-10-CM | POA: Insufficient documentation

## 2018-09-09 DIAGNOSIS — F259 Schizoaffective disorder, unspecified: Secondary | ICD-10-CM | POA: Diagnosis not present

## 2018-09-09 DIAGNOSIS — R05 Cough: Secondary | ICD-10-CM | POA: Diagnosis not present

## 2018-09-09 DIAGNOSIS — F17200 Nicotine dependence, unspecified, uncomplicated: Secondary | ICD-10-CM | POA: Insufficient documentation

## 2018-09-09 DIAGNOSIS — R462 Strange and inexplicable behavior: Secondary | ICD-10-CM | POA: Diagnosis present

## 2018-09-09 LAB — CBG MONITORING, ED: Glucose-Capillary: 383 mg/dL — ABNORMAL HIGH (ref 70–99)

## 2018-09-09 NOTE — ED Provider Notes (Signed)
[facility]-EMERGENCY DEPT Provider Note   CSN: [id number] Arrival date & time: 09/09/18  0930    History   Chief Complaint Chief Complaint  Patient presents with  . Medical Clearance    HPI Katherine Franklin is a 53 y.o. female.     Pt presents to the ED today with bizarre behavior.  The pt was at a local food place trying to get some food.  The pt has a hx of homelessness, schizophrenia and cocaine abuse.  She denies si/hi.  She has a cough, but said that is because she sleeps outside.  She denies any f/c.  She said she's just hungry.     Past Medical History:  Diagnosis Date  . Diabetes mellitus without complication (HCC)   . GERD (gastroesophageal reflux disease)   . Hypertension   . PTSD (post-traumatic stress disorder)   . Schizo affective schizophrenia [facility](HCC)     Patient Active Problem List   Diagnosis Date Noted  . Cocaine dependence (HCC) 08/18/2018  . Major depressive disorder, recurrent severe without psychotic features (HCC) 08/18/2018    Past Surgical History:  Procedure Laterality Date  . SKIN GRAFT       OB History   No obstetric history on file.      Home Medications    Prior to Admission medications   Medication Sig Start Date End Date Taking? Authorizing Provider  ARIPiprazole (ABILIFY) 5 MG tablet Take 1 tablet (5 mg total) by mouth daily. 08/20/18   Charm RingsLord, Jamison Y, NP  ciprofloxacin (CIPRO) 500 MG tablet Take 1 tablet (500 mg total) by mouth 2 (two) times daily. 09/01/18   Garlon HatchetSanders, Lisa M, PA-C  gabapentin (NEURONTIN) 300 MG capsule Take 1 capsule (300 mg total) by mouth 3 (three) times daily. 08/19/18   Charm RingsLord, Jamison Y, NP  lisinopril (PRINIVIL,ZESTRIL) 20 MG tablet Take 1 tablet (20 mg total) by mouth daily. 08/20/18   Charm RingsLord, Jamison Y, NP  metFORMIN (GLUCOPHAGE) 500 MG tablet Take 1 tablet (500 mg total) by mouth 2 (two) times daily with a meal. Patient not taking: Reported on 08/19/2018 08/04/18   Virgina Norfolkuratolo, Adam, DO  metFORMIN  (GLUCOPHAGE) 500 MG tablet Take 1 tablet (500 mg total) by mouth 2 (two) times daily with a meal. 08/19/18   Charm RingsLord, Jamison Y, NP  sertraline (ZOLOFT) 25 MG tablet Take 1 tablet (25 mg total) by mouth daily. 08/20/18   Charm RingsLord, Jamison Y, NP    Family History Family History  Family history unknown: Yes    Social History Social History   Tobacco Use  . Smoking status: Current Every Day Smoker    Packs/day: 1.50  . Smokeless tobacco: Never Used  Substance Use Topics  . Alcohol use: Not Currently    Frequency: Never    Comment: BAC not available at time of assessment  . Drug use: Yes    Frequency: 7.0 times per week    Types: Cocaine, "Crack" cocaine    Comment: UDS not available at time of assessment     Allergies   Sulfa antibiotics and Latex   Review of Systems Review of Systems  Psychiatric/Behavioral: Positive for behavioral problems.  All other systems reviewed and are negative.    Physical Exam Updated Vital Signs BP (!) 144/87 (BP Location: Right Arm)   Pulse 96   Temp 98.4 F (36.9 C) (Oral)   Resp 16   Ht 5\' 9"  (1.753 m)   Wt 110.2 kg   SpO2 96%  BMI 35.88 kg/m   Physical Exam Vitals signs and nursing note reviewed.  Constitutional:      Appearance: Normal appearance. She is obese.  HENT:     Head: Normocephalic and atraumatic.     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     Left Ear: External ear normal.     Nose: Nose normal.  Eyes:     Extraocular Movements: Extraocular movements intact.     Pupils: Pupils are equal, round, and reactive to light.  Neck:     Musculoskeletal: Normal range of motion.  Cardiovascular:     Rate and Rhythm: Normal rate and regular rhythm.     Pulses: Normal pulses.     Heart sounds: Normal heart sounds.  Pulmonary:     Effort: Pulmonary effort is normal.     Breath sounds: Normal breath sounds.  Abdominal:     General: Abdomen is flat.  Musculoskeletal: Normal range of motion.   Skin:    General: Skin is warm.     Capillary Refill: Capillary refill takes less than 2 seconds.  Neurological:     General: No focal deficit present.     Mental Status: She is alert and oriented to person, place, and time.  Psychiatric:        Speech: Speech is tangential.        Judgment: Judgment is impulsive.      ED Treatments / Results  Labs (all labs ordered are listed, but only abnormal results are displayed) Labs Reviewed  CBG MONITORING, ED - Abnormal; Notable for the following components:      Result Value   Glucose-Capillary 383 (*)    All other components within normal limits    EKG None  Radiology No results found.  Procedures Procedures (including critical care time)  Medications Ordered in ED Medications - No data to display   Initial Impression / Assessment and Plan / ED Course  I have reviewed the triage vital signs and the nursing notes.  Pertinent labs & imaging results that were available during my care of the patient were reviewed by me and considered in my medical decision making (see chart for details).    I asked pt if she wants to see MH.  She does not.  She does not want prescriptions for her meds because she said she can't cash her check due to Covid restrictions.  She is not suicidal or homicidal.  No reason to keep her involuntary.  She is feeling better after some food and drink.  Final Clinical Impressions(s) / ED Diagnoses   Final diagnoses:  Schizoaffective disorder, unspecified type [facility])    ED Discharge Orders    None       Jacalyn Lefevre, MD 09/09/18 1004

## 2018-09-09 NOTE — ED Notes (Signed)
Per off duty officer, states they initially took patient to [facility] asked patient if she had been around anyone with COVID and patient stated "yes" so [employer] brought her to [facility] for medical clearance and then she can be transported back to [facility]

## 2018-09-09 NOTE — ED Triage Notes (Signed)
[employer] reports Pt was walking around shopping center topless soliciting for food and money.When confronted [employer] stated Pt told them she has "rona" due to contact with sexual partners. [employer] intitially transported Pt to [facility], who then stated Pt would need Med clearance.

## 2018-09-11 ENCOUNTER — Other Ambulatory Visit: Payer: Self-pay

## 2018-09-11 ENCOUNTER — Emergency Department (HOSPITAL_COMMUNITY)
Admission: EM | Admit: 2018-09-11 | Discharge: 2018-09-11 | Discharge status: Against Medical Advice | Payer: Medicaid Other | Attending: Emergency Medicine | Admitting: Emergency Medicine

## 2018-09-11 ENCOUNTER — Emergency Department (HOSPITAL_COMMUNITY): Payer: Medicaid Other

## 2018-09-11 ENCOUNTER — Encounter (HOSPITAL_COMMUNITY): Payer: Self-pay

## 2018-09-11 DIAGNOSIS — I10 Essential (primary) hypertension: Secondary | ICD-10-CM | POA: Diagnosis not present

## 2018-09-11 DIAGNOSIS — Z91040 Latex allergy status: Secondary | ICD-10-CM | POA: Insufficient documentation

## 2018-09-11 DIAGNOSIS — Z590 Homelessness: Secondary | ICD-10-CM | POA: Insufficient documentation

## 2018-09-11 DIAGNOSIS — Z79899 Other long term (current) drug therapy: Secondary | ICD-10-CM | POA: Insufficient documentation

## 2018-09-11 DIAGNOSIS — E860 Dehydration: Secondary | ICD-10-CM | POA: Diagnosis not present

## 2018-09-11 DIAGNOSIS — E119 Type 2 diabetes mellitus without complications: Secondary | ICD-10-CM | POA: Diagnosis not present

## 2018-09-11 DIAGNOSIS — Z20828 Contact with and (suspected) exposure to other viral communicable diseases: Secondary | ICD-10-CM | POA: Insufficient documentation

## 2018-09-11 DIAGNOSIS — R112 Nausea with vomiting, unspecified: Secondary | ICD-10-CM | POA: Insufficient documentation

## 2018-09-11 DIAGNOSIS — R197 Diarrhea, unspecified: Secondary | ICD-10-CM | POA: Insufficient documentation

## 2018-09-11 DIAGNOSIS — F17210 Nicotine dependence, cigarettes, uncomplicated: Secondary | ICD-10-CM | POA: Diagnosis not present

## 2018-09-11 DIAGNOSIS — Z7984 Long term (current) use of oral hypoglycemic drugs: Secondary | ICD-10-CM | POA: Insufficient documentation

## 2018-09-11 DIAGNOSIS — R102 Pelvic and perineal pain: Secondary | ICD-10-CM | POA: Insufficient documentation

## 2018-09-11 DIAGNOSIS — R109 Unspecified abdominal pain: Secondary | ICD-10-CM | POA: Diagnosis present

## 2018-09-11 LAB — COMPREHENSIVE METABOLIC PANEL
ALT: 15 U/L (ref 0–44)
AST: 15 U/L (ref 15–41)
Albumin: 3.4 g/dL — ABNORMAL LOW (ref 3.5–5.0)
Alkaline Phosphatase: 95 U/L (ref 38–126)
Anion gap: 14 (ref 5–15)
BUN: 18 mg/dL (ref 6–20)
CO2: 26 mmol/L (ref 22–32)
Calcium: 9.2 mg/dL (ref 8.9–10.3)
Chloride: 91 mmol/L — ABNORMAL LOW (ref 98–111)
Creatinine, Ser: 1.33 mg/dL — ABNORMAL HIGH (ref 0.44–1.00)
GFR calc Af Amer: 52 mL/min — ABNORMAL LOW (ref >60)
GFR calc non Af Amer: 45 mL/min — ABNORMAL LOW (ref >60)
Glucose, Bld: 624 mg/dL (ref 70–99)
Potassium: 4.2 mmol/L (ref 3.5–5.1)
Sodium: 131 mmol/L — ABNORMAL LOW (ref 135–145)
Total Bilirubin: 0.6 mg/dL (ref 0.3–1.2)
Total Protein: 7.3 g/dL (ref 6.5–8.1)

## 2018-09-11 LAB — CBC WITH DIFFERENTIAL/PLATELET
Abs Immature Granulocytes: 0.02 10*3/uL (ref 0.00–0.07)
Basophils Absolute: 0 10*3/uL (ref 0.0–0.1)
Basophils Relative: 1 %
Eosinophils Absolute: 0.1 10*3/uL (ref 0.0–0.5)
Eosinophils Relative: 2 %
HCT: 39.8 % (ref 36.0–46.0)
Hemoglobin: 12.6 g/dL (ref 12.0–15.0)
Immature Granulocytes: 0 %
Lymphocytes Relative: 24 %
Lymphs Abs: 2 10*3/uL (ref 0.7–4.0)
MCH: 27.1 pg (ref 26.0–34.0)
MCHC: 31.7 g/dL (ref 30.0–36.0)
MCV: 85.6 fL (ref 80.0–100.0)
Monocytes Absolute: 0.5 10*3/uL (ref 0.1–1.0)
Monocytes Relative: 6 %
Neutro Abs: 5.6 10*3/uL (ref 1.7–7.7)
Neutrophils Relative %: 67 %
Platelets: 314 10*3/uL (ref 150–400)
RBC: 4.65 MIL/uL (ref 3.87–5.11)
RDW: 14.2 % (ref 11.5–15.5)
WBC: 8.2 10*3/uL (ref 4.0–10.5)
nRBC: 0 % (ref 0.0–0.2)

## 2018-09-11 LAB — CBG MONITORING, ED: Glucose-Capillary: 564 mg/dL (ref 70–99)

## 2018-09-11 LAB — SARS CORONAVIRUS 2 BY RT PCR (HOSPITAL ORDER, PERFORMED IN ~~LOC~~ HOSPITAL LAB): SARS Coronavirus 2: NEGATIVE

## 2018-09-11 LAB — TROPONIN I: Troponin I: <0.03 ng/mL (ref <0.03)

## 2018-09-11 LAB — I-STAT BETA HCG BLOOD, ED (MC, WL, AP ONLY): I-stat hCG, quantitative: <5 m[IU]/mL (ref <5)

## 2018-09-11 LAB — LACTIC ACID, PLASMA: Lactic Acid, Venous: 3.6 mmol/L (ref 0.5–1.9)

## 2018-09-11 MED ORDER — SODIUM CHLORIDE 0.9 % IV BOLUS
1000.0000 mL | Freq: Once | INTRAVENOUS | Status: AC
  Administered 2018-09-11: 1000 mL via INTRAVENOUS

## 2018-09-11 NOTE — ED Provider Notes (Signed)
Care assumed from Dr. Madilyn Hook.  Please see her full H&P.  In short,  Katherine Franklin is a 53 y.o. female presents for abdominal pain, nausea, vomiting, and diarrhea. Patient also had a cough, but no shortness of breath. Patient is homeless and reports possible sick exposures. Patient has been given IVF. Patient is not psychotic at this time and does not need a psychiatric evaluation. Labs are pending.   Patient became agitated and stated she wants to leave because, "She has things to do." Discussed labs are pending. Patient is alert and oriented and understands the risks of leaving against medical advise.   The patient and I discussed the nature and purpose, risks and benefits, as well as, the alternatives of treatment. Time was given to allow the opportunity to ask questions and consider options. After the discussion, the patient decided to refuse the offerred treatment. The patient was informed that refusal could lead to, but was not limited to, death, permanent disability, or severe pain. If present, I asked the relatives and/or significant others to dissuade the patient without success. Prior to refusing, I determined that the patient had the capacity to make their decision and understood the consequences of that decision. After refusal, I made every reasonable effort to treat them to the best of my ability.  The patient was notified that they may return to the emergency department at any time for further treatment.    I have discussed my concerns as his provider and the possibility that this may worsen. I have specifically discussed that without further evaluation I cannot guarantee there is not a life threatening event occuring.  Pt is A&Ox4, his own POA and states understanding of my concerns and the possible consequences.  I have made pt aware that this is an AMA discharge, but she may return at any time for further evaluation and treatment.  Findings and plan of care discussed with supervising  physician Dr. Dalene Seltzer.     Carlyle Basques [place], [place] 09/11/18 Kristopher Oppenheim    Alvira Monday, MD 09/12/18 [phone]

## 2018-09-11 NOTE — Progress Notes (Signed)
CSW received an acknowledges consult for homelessness and possible COVID-19. CSW will follow as patient's medical work-up proceeds. Patient would need a positive COVID test, be able to ambulate and complete ADL's, and be under isolation orders among other criteria to be considered for a referral to county funded temporary shelter regarding homeless COVID positive individuals.   Tenna Delaine, LCSW, LCAS-A Clinical Social Worker II [phone]

## 2018-09-11 NOTE — ED Notes (Signed)
Pt was asked to stay and refused. Pt was AOx4. Pt decided to refuse care and left. Pt signed AMA.

## 2018-09-11 NOTE — ED Notes (Signed)
Pt opened her room door and demanded her IV be removed. She is CAOx4. She was strongly and repeated encouraged to stay at the hospital to finish her medical treatment, but she continued to refuse care. Her IV was removed and site bandaged.

## 2018-09-11 NOTE — ED Provider Notes (Signed)
[facility] EMERGENCY DEPARTMENT Provider Note   CSN: [id number] Arrival date & time:       History   Chief Complaint Chief Complaint  Patient presents with  . Abdominal Pain  . Hyperglycemia    HPI Katherine Franklin is a 53 y.o. female.     The history is provided by the patient and medical records. No language interpreter was used.  Abdominal Pain  Hyperglycemia  Associated symptoms: abdominal pain    Katherine Franklin is a 53 y.o. female who presents to the Emergency Department complaining of vomiting/diarrhea. She presents to the emergency department complaining of vomiting and diarrhea. Per triage note she was found with her top off and when [facility] attempted to take her to behavioral health she stated that she has the "[place]:. She states that she is homeless and has been having trouble obtaining her medications. She states that she is been feeling poorly for the last several days with vomiting and diarrhea. No reports of fevers but she does feel hot at times. She does have a mild cough and shortness of breath. She denies any dysuria. She denies any SI, HI. She states that she is not been able to forward her diabetic medications for at least a year. She states that she has had sick contacts but is not sure if they have coronavirus. Symptoms are moderate and constant nature. She denies any abdominal pain but does state that she is sick on her stomach and has a sour stomach. Past Medical History:  Diagnosis Date  . Diabetes mellitus without complication (HCC)   . GERD (gastroesophageal reflux disease)   . Hypertension   . PTSD (post-traumatic stress disorder)   . Schizo affective schizophrenia [facility])     Patient Active Problem List   Diagnosis Date Noted  . Cocaine dependence (HCC) 08/18/2018  . Major depressive disorder, recurrent severe without psychotic features (HCC) 08/18/2018    Past Surgical History:  Procedure Laterality Date  . SKIN GRAFT       OB  History   No obstetric history on file.      Home Medications    Prior to Admission medications   Medication Sig Start Date End Date Taking? Authorizing Provider  ARIPiprazole (ABILIFY) 5 MG tablet Take 1 tablet (5 mg total) by mouth daily. 08/20/18   Charm Rings, NP  ciprofloxacin (CIPRO) 500 MG tablet Take 1 tablet (500 mg total) by mouth 2 (two) times daily. 09/01/18   Garlon Hatchet, PA-C  gabapentin (NEURONTIN) 300 MG capsule Take 1 capsule (300 mg total) by mouth 3 (three) times daily. 08/19/18   Charm Rings, NP  lisinopril (PRINIVIL,ZESTRIL) 20 MG tablet Take 1 tablet (20 mg total) by mouth daily. 08/20/18   Charm Rings, NP  metFORMIN (GLUCOPHAGE) 500 MG tablet Take 1 tablet (500 mg total) by mouth 2 (two) times daily with a meal. Patient not taking: Reported on 08/19/2018 08/04/18   Virgina Norfolk, DO  metFORMIN (GLUCOPHAGE) 500 MG tablet Take 1 tablet (500 mg total) by mouth 2 (two) times daily with a meal. 08/19/18   Charm Rings, NP  sertraline (ZOLOFT) 25 MG tablet Take 1 tablet (25 mg total) by mouth daily. 08/20/18   Charm Rings, NP    Family History Family History  Family history unknown: Yes    Social History Social History   Tobacco Use  . Smoking status: Current Every Day Smoker    Packs/day: 1.50  . Smokeless tobacco: Never  Used  Substance Use Topics  . Alcohol use: Not Currently    Frequency: Never    Comment: BAC not available at time of assessment  . Drug use: Yes    Frequency: 7.0 times per week    Types: Cocaine, "Crack" cocaine    Comment: UDS not available at time of assessment     Allergies   Sulfa antibiotics and Latex   Review of Systems Review of Systems  Gastrointestinal: Positive for abdominal pain.  All other systems reviewed and are negative.    Physical Exam Updated Vital Signs Temp 99.5 F (37.5 C) (Oral)   Ht 5\' 9"  (1.753 m)   Wt 110.2 kg   BMI 35.88 kg/m   Physical Exam Vitals signs and nursing note reviewed.   Constitutional:      Appearance: She is well-developed.  HENT:     Head: Normocephalic and atraumatic.  Cardiovascular:     Rate and Rhythm: Normal rate and regular rhythm.     Heart sounds: No murmur.  Pulmonary:     Effort: Pulmonary effort is normal. No respiratory distress.  Abdominal:     Palpations: Abdomen is soft.     Tenderness: There is no abdominal tenderness. There is no guarding or rebound.  Musculoskeletal:        General: No tenderness.     Comments: 2+ DP pulses bilaterally. There are non-blanching dark lesions to the lateral aspect of the right fifth toe as well as the third toe. No significant surrounding erythema or edema.  Skin:    General: Skin is warm and dry.     Capillary Refill: Capillary refill takes less than 2 seconds.  Neurological:     Mental Status: She is alert and oriented to person, place, and time.  Psychiatric:        Mood and Affect: Mood normal.        Behavior: Behavior normal.      ED Treatments / Results  Labs (all labs ordered are listed, but only abnormal results are displayed) Labs Reviewed  CBG MONITORING, ED - Abnormal; Notable for the following components:      Result Value   Glucose-Capillary 564 (*)    All other components within normal limits  URINE CULTURE  SARS CORONAVIRUS 2 (HOSPITAL ORDER, PERFORMED IN [facility])  COMPREHENSIVE METABOLIC PANEL  CBC WITH DIFFERENTIAL/PLATELET  URINALYSIS, ROUTINE W REFLEX MICROSCOPIC  LACTIC ACID, PLASMA  LACTIC ACID, PLASMA  TROPONIN I  RAPID URINE DRUG SCREEN, HOSP PERFORMED  I-STAT BETA HCG BLOOD, ED ([facility], [facility], [facility] ONLY)    EKG None  Radiology No results found.  Procedures Procedures (including critical care time)  Medications Ordered in ED Medications  sodium chloride 0.9 % bolus 1,000 mL (has no administration in time range)     Initial Impression / Assessment and Plan / ED Course  I have reviewed the triage vital signs and the nursing notes.   Pertinent labs & imaging results that were available during my care of the patient were reviewed by me and considered in my medical decision making (see chart for details).        Patient with history of schizoaffective disorder as well as diabetes here for evaluation of vomiting and diarrhea. She is non-toxic appearing on evaluation and she is calm and appropriate with no current evidence of psychosis. She is markedly hyperglycemic. Plan to provide IV fluids and check labs. Patient care transferred pending labs and reassessment.  Final Clinical Impressions(s) /  ED Diagnoses   Final diagnoses:  None    ED Discharge Orders    None       Tilden Fossa, MD 09/11/18 1900

## 2018-09-11 NOTE — Care Management (Signed)
ED CM consulted concerning possible assistance with medications upon discharge, patient has [place] Medicaid[place] Access, CM can contact patient's pharmacy to have co-pay waived, CM will continue to follow patient for transitional care assistance

## 2018-09-22 DIAGNOSIS — F1910 Other psychoactive substance abuse, uncomplicated: Secondary | ICD-10-CM

## 2018-09-22 HISTORY — DX: Other psychoactive substance abuse, uncomplicated: F19.10

## 2018-09-23 ENCOUNTER — Encounter (HOSPITAL_COMMUNITY): Payer: Self-pay | Admitting: *Deleted

## 2018-09-23 ENCOUNTER — Emergency Department (HOSPITAL_COMMUNITY)
Admission: EM | Admit: 2018-09-23 | Discharge: 2018-09-23 | Disposition: A | Discharge status: Home / Self Care | Payer: Medicaid Other | Attending: Emergency Medicine | Admitting: Emergency Medicine

## 2018-09-23 ENCOUNTER — Other Ambulatory Visit: Payer: Self-pay

## 2018-09-23 DIAGNOSIS — F1410 Cocaine abuse, uncomplicated: Secondary | ICD-10-CM | POA: Diagnosis present

## 2018-09-23 DIAGNOSIS — Z5321 Procedure and treatment not carried out due to patient leaving prior to being seen by health care provider: Secondary | ICD-10-CM | POA: Diagnosis not present

## 2018-09-23 DIAGNOSIS — R739 Hyperglycemia, unspecified: Secondary | ICD-10-CM

## 2018-09-23 LAB — CBG MONITORING, ED: Glucose-Capillary: 577 mg/dL (ref 70–99)

## 2018-09-23 NOTE — ED Notes (Signed)
PT refusing all medical care and reported she was leaving. Pt Ambulatory to door, Pt encouraged  To stay for assessment Pt  Refused care . PA informed that PT walked out.

## 2018-09-23 NOTE — ED Triage Notes (Signed)
During triage Pt rfeported " don't you know people who use Cocaine can" wait long."  Pt last reports last cocaine was used this AM.

## 2018-09-23 NOTE — ED Triage Notes (Signed)
PT reports it has been  TWO YEARS  Since last  Time having diabetic meds.

## 2018-09-23 NOTE — ED Triage Notes (Signed)
Pt is here for oral herpes flare up. Per [facility] pt's blood sugar was over 500. Pt also is presenting with manic, flight of ideas and tangenital speech

## 2018-09-23 NOTE — ED Notes (Signed)
Pt left AMA, screaming at registration

## 2018-09-23 NOTE — ED Notes (Signed)
Left without waiting for AMA papers

## 2018-09-23 NOTE — ED Triage Notes (Signed)
PT reports she refuses all medical treatment on arrival to ED. Pt reported she was leaving. The hospital .

## 2018-09-23 NOTE — ED Triage Notes (Signed)
PER EMS pt called for sores on her mouth and o n arrival to home Pt reported she was out of all her medications including her diabetic medications. Per EMS  CBG 589. Pt ambulatory on arrival to ED.

## 2018-09-25 ENCOUNTER — Encounter (HOSPITAL_COMMUNITY): Payer: Self-pay

## 2018-09-25 ENCOUNTER — Other Ambulatory Visit: Payer: Self-pay

## 2018-09-25 ENCOUNTER — Emergency Department (HOSPITAL_COMMUNITY)
Admission: EM | Admit: 2018-09-25 | Discharge: 2018-09-25 | Discharge status: Against Medical Advice | Payer: Medicaid Other | Attending: Emergency Medicine | Admitting: Emergency Medicine

## 2018-09-25 DIAGNOSIS — I10 Essential (primary) hypertension: Secondary | ICD-10-CM | POA: Insufficient documentation

## 2018-09-25 DIAGNOSIS — F4310 Post-traumatic stress disorder, unspecified: Secondary | ICD-10-CM | POA: Diagnosis not present

## 2018-09-25 DIAGNOSIS — Z5320 Procedure and treatment not carried out because of patient's decision for unspecified reasons: Secondary | ICD-10-CM | POA: Insufficient documentation

## 2018-09-25 DIAGNOSIS — F1910 Other psychoactive substance abuse, uncomplicated: Secondary | ICD-10-CM | POA: Diagnosis not present

## 2018-09-25 DIAGNOSIS — F17210 Nicotine dependence, cigarettes, uncomplicated: Secondary | ICD-10-CM | POA: Insufficient documentation

## 2018-09-25 DIAGNOSIS — E1165 Type 2 diabetes mellitus with hyperglycemia: Secondary | ICD-10-CM | POA: Insufficient documentation

## 2018-09-25 DIAGNOSIS — R739 Hyperglycemia, unspecified: Secondary | ICD-10-CM

## 2018-09-25 DIAGNOSIS — Z7984 Long term (current) use of oral hypoglycemic drugs: Secondary | ICD-10-CM | POA: Insufficient documentation

## 2018-09-25 DIAGNOSIS — Z91040 Latex allergy status: Secondary | ICD-10-CM | POA: Diagnosis not present

## 2018-09-25 DIAGNOSIS — F259 Schizoaffective disorder, unspecified: Secondary | ICD-10-CM | POA: Diagnosis not present

## 2018-09-25 DIAGNOSIS — Z9114 Patient's other noncompliance with medication regimen: Secondary | ICD-10-CM | POA: Diagnosis not present

## 2018-09-25 DIAGNOSIS — Z760 Encounter for issue of repeat prescription: Secondary | ICD-10-CM | POA: Diagnosis present

## 2018-09-25 LAB — COMPREHENSIVE METABOLIC PANEL
ALT: 17 U/L (ref 0–44)
AST: 16 U/L (ref 15–41)
Albumin: 3.3 g/dL — ABNORMAL LOW (ref 3.5–5.0)
Alkaline Phosphatase: 89 U/L (ref 38–126)
Anion gap: 8 (ref 5–15)
BUN: 11 mg/dL (ref 6–20)
CO2: 27 mmol/L (ref 22–32)
Calcium: 9.4 mg/dL (ref 8.9–10.3)
Chloride: 98 mmol/L (ref 98–111)
Creatinine, Ser: 1.04 mg/dL — ABNORMAL HIGH (ref 0.44–1.00)
GFR calc Af Amer: >60 mL/min (ref >60)
GFR calc non Af Amer: >60 mL/min (ref >60)
Glucose, Bld: 437 mg/dL — ABNORMAL HIGH (ref 70–99)
Potassium: 4.2 mmol/L (ref 3.5–5.1)
Sodium: 133 mmol/L — ABNORMAL LOW (ref 135–145)
Total Bilirubin: 0.4 mg/dL (ref 0.3–1.2)
Total Protein: 7 g/dL (ref 6.5–8.1)

## 2018-09-25 LAB — CBC
HCT: 38.6 % (ref 36.0–46.0)
Hemoglobin: 12.2 g/dL (ref 12.0–15.0)
MCH: 26.8 pg (ref 26.0–34.0)
MCHC: 31.6 g/dL (ref 30.0–36.0)
MCV: 84.8 fL (ref 80.0–100.0)
Platelets: 334 10*3/uL (ref 150–400)
RBC: 4.55 MIL/uL (ref 3.87–5.11)
RDW: 13.5 % (ref 11.5–15.5)
WBC: 5 10*3/uL (ref 4.0–10.5)
nRBC: 0 % (ref 0.0–0.2)

## 2018-09-25 MED ORDER — ARIPIPRAZOLE 5 MG PO TABS
5.0000 mg | ORAL_TABLET | Freq: Every day | ORAL | Status: DC
  Filled 2018-09-25: qty 1

## 2018-09-25 MED ORDER — SODIUM CHLORIDE 0.9 % IV BOLUS: 1000.0000 mL | Freq: Once | INTRAVENOUS | Status: DC

## 2018-09-25 MED ORDER — METFORMIN HCL 500 MG PO TABS
500.0000 mg | ORAL_TABLET | Freq: Once | ORAL | Status: DC
  Filled 2018-09-25: qty 1

## 2018-09-25 MED ORDER — LISINOPRIL 20 MG PO TABS
20.0000 mg | ORAL_TABLET | Freq: Every day | ORAL | Status: DC
  Filled 2018-09-25 (×2): qty 1

## 2018-09-25 NOTE — ED Provider Notes (Signed)
[facility] EMERGENCY DEPARTMENT Provider Note   CSN: [id number] Arrival date & time: 09/25/18  1011    History   Chief Complaint Chief Complaint  Patient presents with  . Hyperglycemia    HPI Katherine Franklin is a 53 y.o. female.     HPI  Patient here complaining of out of all meds for one year.  She complains of zoster outbreak on mouth states she hasn't been able to smoke and has quit smoking cigarettes today due to this.  She reports recent cocaine use past several days.  She is homeless here in [place] after previously [place] and receiving care there. She request assistance getting her meds filled and requests SW assistance. She denies injury, cough, fever, chills, nausea, vomiting or diarrhea.   Past Medical History:  Diagnosis Date  . Diabetes mellitus without complication (HCC)   . Drug abuse (HCC) 09/22/2018  . GERD (gastroesophageal reflux disease)   . Hypertension   . PTSD (post-traumatic stress disorder)   . Schizo affective schizophrenia [facility])     Patient Active Problem List   Diagnosis Date Noted  . Cocaine dependence (HCC) 08/18/2018  . Major depressive disorder, recurrent severe without psychotic features (HCC) 08/18/2018    Past Surgical History:  Procedure Laterality Date  . SKIN GRAFT       OB History   No obstetric history on file.      Home Medications    Prior to Admission medications   Medication Sig Start Date End Date Taking? Authorizing Provider  ARIPiprazole (ABILIFY) 5 MG tablet Take 1 tablet (5 mg total) by mouth daily. 08/20/18   Charm Rings, NP  ciprofloxacin (CIPRO) 500 MG tablet Take 1 tablet (500 mg total) by mouth 2 (two) times daily. 09/01/18   Garlon Hatchet, PA-C  gabapentin (NEURONTIN) 300 MG capsule Take 1 capsule (300 mg total) by mouth 3 (three) times daily. 08/19/18   Charm Rings, NP  lisinopril (PRINIVIL,ZESTRIL) 20 MG tablet Take 1 tablet (20 mg total) by mouth daily. 08/20/18   Charm Rings,  NP  metFORMIN (GLUCOPHAGE) 500 MG tablet Take 1 tablet (500 mg total) by mouth 2 (two) times daily with a meal. Patient not taking: Reported on 08/19/2018 08/04/18   Virgina Norfolk, DO  metFORMIN (GLUCOPHAGE) 500 MG tablet Take 1 tablet (500 mg total) by mouth 2 (two) times daily with a meal. 08/19/18   Charm Rings, NP  sertraline (ZOLOFT) 25 MG tablet Take 1 tablet (25 mg total) by mouth daily. 08/20/18   Charm Rings, NP    Family History Family History  Family history unknown: Yes    Social History Social History   Tobacco Use  . Smoking status: Current Every Day Smoker    Packs/day: 1.50  . Smokeless tobacco: Never Used  Substance Use Topics  . Alcohol use: Not Currently    Frequency: Never    Comment: BAC not available at time of assessment  . Drug use: Yes    Frequency: 7.0 times per week    Types: Cocaine, "Crack" cocaine    Comment: last used AM 09-22-2018     Allergies   Sulfa antibiotics and Latex   Review of Systems Review of Systems  All other systems reviewed and are negative.    Physical Exam Updated Vital Signs Ht 1.753 m (5\' 9" )   Wt 109.8 kg   BMI 35.74 kg/m   Physical Exam Vitals signs and nursing note reviewed.  HENT:  Head: Normocephalic.     Right Ear: Tympanic membrane and external ear normal.     Left Ear: Tympanic membrane and external ear normal.     Nose: Nose normal.     Mouth/Throat:     Mouth: Mucous membranes are moist.  Eyes:     Pupils: Pupils are equal, round, and reactive to light.  Neck:     Musculoskeletal: Normal range of motion.  Cardiovascular:     Rate and Rhythm: Normal rate and regular rhythm.     Pulses: Normal pulses.  Pulmonary:     Effort: Pulmonary effort is normal.     Breath sounds: Normal breath sounds.  Abdominal:     General: Abdomen is flat.  Musculoskeletal: Normal range of motion.  Skin:    General: Skin is warm and dry.     Capillary Refill: Capillary refill takes less than 2 seconds.      Comments: Lip lesions c.w. herpes 1   Neurological:     General: No focal deficit present.     Mental Status: She is alert and oriented to person, place, and time.      ED Treatments / Results  Labs (all labs ordered are listed, but only abnormal results are displayed) Labs Reviewed  CBC  COMPREHENSIVE METABOLIC PANEL    EKG None  Radiology No results found.  Procedures Procedures (including critical care time)  Medications Ordered in ED Medications  sodium chloride 0.9 % bolus 1,000 mL (has no administration in time range)  metFORMIN (GLUCOPHAGE) tablet 500 mg (has no administration in time range)  ARIPiprazole (ABILIFY) tablet 5 mg (has no administration in time range)  lisinopril (ZESTRIL) tablet 20 mg (has no administration in time range)     Initial Impression / Assessment and Plan / ED Course  I have reviewed the triage vital signs and the nursing notes.  Pertinent labs & imaging results that were available during my care of the patient were reviewed by me and considered in my medical decision making (see chart for details).        Initial bs 577- patient had sausage egg biscuit this am Will check labs Fluid bolus Oral meds ordered SW saw and evaluated and will assist with meds and shelter 11:45 AM Nurse reports patient has left AGAINST MEDICAL ADVICE Patient told nurse that she has been doing crack and we are taking too long and she wants to more crack  Final Clinical Impressions(s) / ED Diagnoses   Final diagnoses:  Hyperglycemia  Substance abuse (HCC)  Noncompliance with medication regimen    ED Discharge Orders    None       Margarita Grizzleay, Maycel Riffe, MD 09/25/18 1146

## 2018-09-25 NOTE — ED Notes (Addendum)
Pt finally came out of restroom after being in there 1 hour and this RN in room to attempt IV. Pt began stating "I don't mean no harm but I'm a crack addict, and y'all took too long and you ain't sticking no IV in me, I'm in that crack mood and I'm leaving.Marland KitchenMarland KitchenMarland KitchenMarland Kitchenpeace." Pt observed to ambulate to the exit and left AMA. Dr Rosalia Hammers aware.

## 2018-09-25 NOTE — Progress Notes (Signed)
CSW was completing chart review to go meet with patient at bedside when RN entered a note into the chart stating the patient had left AMA due to being in a "crack mood."   .CSW signing off.  Edwin Dada, MSW, LCSW-A Clinical Social Worker Emergency Department [employer] [phone]

## 2018-09-25 NOTE — TOC Transition Note (Signed)
Transition of Care [facility]) - CM/SW Discharge Note   Patient Details  Name: Katherine Franklin MRN: [id number] Date of Birth: 07-02-1965  Transition of Care [facility]) CM/SW Contact:  Oletta Cohn, RN Phone Number: 09/25/2018, 12:26 PM   Clinical Narrative:    [facility]    Final next level of care: Homeless Shelter Barriers to Discharge: Patient left Against Medical Advice [facility])   Patient Goals and CMS Choice Patient states their goals for this hospitalization and ongoing recovery are:: get my medication and something for pain      Discharge Placement                       Discharge Plan and Services    [facility] contacted [facility] pharmacy to arrange delivery of Rx to pt prior discharge home.                                 Social Determinants of Health (SDOH) Interventions     Readmission Risk Interventions No flowsheet data found.

## 2018-09-25 NOTE — TOC Initial Note (Addendum)
Transition of Care [facility]) - Initial/Assessment Note    Patient Details  Name: Katherine Franklin MRN: [id number] Date of Birth: May 22, 1965  Transition of Care [facility]) CM/SW Contact:    Oletta Cohn, RN Phone Number: 09/25/2018, 12:22 PM  Clinical Narrative:                 [facility] spoke with pt at beside regarding lack of getting medication (pt is insured).  Expected Discharge Plan: Homeless Shelter Barriers to Discharge: Patient left Against Medical Advice [facility])   Patient Goals and CMS Choice Patient states their goals for this hospitalization and ongoing recovery are:: get my medication and something for pain      Expected Discharge Plan and Services Expected Discharge Plan: Homeless Shelter       Living arrangements for the past 2 months: Homeless Expected Discharge Date: 09/25/18                                    Prior Living Arrangements/Services Living arrangements for the past 2 months: Homeless Lives with:: Self Patient language and need for interpreter reviewed:: Yes Do you feel safe going back to the place where you live?: No   "I just sleep whereever I lay; most of time it is on the street and I'm not from here."  Need for Family Participation in Patient Care: Yes (Comment) Care giver support system in place?: Yes (comment)   Criminal Activity/Legal Involvement Pertinent to Current Situation/Hospitalization: No - Comment as needed  Activities of Daily Living      Permission Sought/Granted Permission sought to share information with : Case Manager, Other (comment)(SW) Permission granted to share information with : Yes, Verbal Permission Granted              Emotional Assessment Appearance:: Disheveled Attitude/Demeanor/Rapport: Crying, Engaged Affect (typically observed): Tearful/Crying Orientation: : Oriented to Self, Oriented to Place, Oriented to  Time, Oriented to Situation Alcohol / Substance Use: Tobacco Use, Alcohol Use, Illicit Drugs Psych  Involvement: Yes (comment)  Admission diagnosis:  Hyperglycemia; Rx Refill Patient Active Problem List   Diagnosis Date Noted  . Cocaine dependence (HCC) 08/18/2018  . Major depressive disorder, recurrent severe without psychotic features (HCC) 08/18/2018   PCP:  System, Pcp Not In Pharmacy:   [facility] Department of Public - [place], [place] - [address] [place] [address] Phone: [phone] Fax: [phone]  [name] - [place], [place] - [address] [place] [address] Phone: [phone] Fax: [phone]     Social Determinants of Health (SDOH) Interventions    Readmission Risk Interventions No flowsheet data found.

## 2018-09-25 NOTE — TOC Transition Note (Signed)
Transition of Care [facility]) - CM/SW Discharge Note   Patient Details  Name: Katherine Franklin MRN: [id number] Date of Birth: December 29, 1965  Transition of Care [facility]) CM/SW Contact:  Oletta Cohn, RN Phone Number: 09/25/2018, 12:20 PM   Clinical Narrative:    [facility] spoke with pt at beside regarding lack of getting medication (pt is insured).   Final next level of care: Homeless Shelter Barriers to Discharge: Patient left Against Medical Advice [facility])   Patient Goals and CMS Choice Patient states their goals for this hospitalization and ongoing recovery are:: get my medication and something for pain      Discharge Placement               AMA        Discharge Plan and Services    Pt left AMA prior to [facility] pharmacy filling Rx and SW visit.                                 Social Determinants of Health (SDOH) Interventions     Readmission Risk Interventions No flowsheet data found.

## 2018-09-25 NOTE — ED Triage Notes (Signed)
Pt came with [facility] & was reported to have a CBE of 497, also the pt was at the pharmacy when EMS was contacted because pt wants to be evaluated for the prescriptions that she has been out of for a year (per pt).

## 2018-09-26 ENCOUNTER — Emergency Department (HOSPITAL_COMMUNITY)
Admission: EM | Admit: 2018-09-26 | Discharge: 2018-09-26 | Discharge status: Against Medical Advice | Payer: Medicaid Other

## 2018-09-26 NOTE — ED Notes (Signed)
Patient out in lobby yelling and screaming. Throwing things on floor, and at [employer]. Security called and patient ran out the door.

## 2018-09-26 NOTE — ED Triage Notes (Signed)
Patient here via EMS requesting detox from crack.

## 2018-09-29 ENCOUNTER — Other Ambulatory Visit: Payer: Self-pay

## 2018-09-29 ENCOUNTER — Emergency Department (HOSPITAL_COMMUNITY): Payer: Medicaid Other

## 2018-09-29 ENCOUNTER — Emergency Department (HOSPITAL_COMMUNITY)
Admission: EM | Admit: 2018-09-29 | Discharge: 2018-09-29 | Discharge status: Against Medical Advice | Payer: Medicaid Other | Attending: Emergency Medicine | Admitting: Emergency Medicine

## 2018-09-29 DIAGNOSIS — Z91040 Latex allergy status: Secondary | ICD-10-CM | POA: Insufficient documentation

## 2018-09-29 DIAGNOSIS — F17200 Nicotine dependence, unspecified, uncomplicated: Secondary | ICD-10-CM | POA: Diagnosis not present

## 2018-09-29 DIAGNOSIS — Z79899 Other long term (current) drug therapy: Secondary | ICD-10-CM | POA: Diagnosis not present

## 2018-09-29 DIAGNOSIS — R079 Chest pain, unspecified: Secondary | ICD-10-CM

## 2018-09-29 DIAGNOSIS — E119 Type 2 diabetes mellitus without complications: Secondary | ICD-10-CM | POA: Diagnosis not present

## 2018-09-29 DIAGNOSIS — R0789 Other chest pain: Secondary | ICD-10-CM | POA: Diagnosis not present

## 2018-09-29 DIAGNOSIS — F1410 Cocaine abuse, uncomplicated: Secondary | ICD-10-CM | POA: Diagnosis not present

## 2018-09-29 LAB — BASIC METABOLIC PANEL
Anion gap: 9 (ref 5–15)
BUN: 13 mg/dL (ref 6–20)
CO2: 26 mmol/L (ref 22–32)
Calcium: 9.4 mg/dL (ref 8.9–10.3)
Chloride: 99 mmol/L (ref 98–111)
Creatinine, Ser: 0.85 mg/dL (ref 0.44–1.00)
GFR calc Af Amer: >60 mL/min (ref >60)
GFR calc non Af Amer: >60 mL/min (ref >60)
Glucose, Bld: 353 mg/dL — ABNORMAL HIGH (ref 70–99)
Potassium: 3.9 mmol/L (ref 3.5–5.1)
Sodium: 134 mmol/L — ABNORMAL LOW (ref 135–145)

## 2018-09-29 LAB — RAPID URINE DRUG SCREEN, HOSP PERFORMED
Amphetamines: NOT DETECTED
Barbiturates: NOT DETECTED
Benzodiazepines: NOT DETECTED
Cocaine: POSITIVE — AB
Opiates: NOT DETECTED
Tetrahydrocannabinol: NOT DETECTED

## 2018-09-29 LAB — CBC WITH DIFFERENTIAL/PLATELET
Abs Immature Granulocytes: 0.01 10*3/uL (ref 0.00–0.07)
Basophils Absolute: 0 10*3/uL (ref 0.0–0.1)
Basophils Relative: 1 %
Eosinophils Absolute: 0.1 10*3/uL (ref 0.0–0.5)
Eosinophils Relative: 2 %
HCT: 37.9 % (ref 36.0–46.0)
Hemoglobin: 12.2 g/dL (ref 12.0–15.0)
Immature Granulocytes: 0 %
Lymphocytes Relative: 44 %
Lymphs Abs: 2.3 10*3/uL (ref 0.7–4.0)
MCH: 27.2 pg (ref 26.0–34.0)
MCHC: 32.2 g/dL (ref 30.0–36.0)
MCV: 84.4 fL (ref 80.0–100.0)
Monocytes Absolute: 0.3 10*3/uL (ref 0.1–1.0)
Monocytes Relative: 6 %
Neutro Abs: 2.5 10*3/uL (ref 1.7–7.7)
Neutrophils Relative %: 47 %
Platelets: 267 10*3/uL (ref 150–400)
RBC: 4.49 MIL/uL (ref 3.87–5.11)
RDW: 13.4 % (ref 11.5–15.5)
WBC: 5.2 10*3/uL (ref 4.0–10.5)
nRBC: 0 % (ref 0.0–0.2)

## 2018-09-29 LAB — CBG MONITORING, ED: Glucose-Capillary: 332 mg/dL — ABNORMAL HIGH (ref 70–99)

## 2018-09-29 LAB — TROPONIN I: Troponin I: <0.03 ng/mL (ref <0.03)

## 2018-09-29 NOTE — ED Notes (Signed)
Pt mood escalated and anxious to leave to catch the bus to get to her nourishment.  IV dc'd MD aware and pt ambulatory to exit.

## 2018-09-29 NOTE — ED Provider Notes (Signed)
[facility]   CSN: [id number] Arrival date & time: 09/29/18  1624    History   Chief Complaint Chief Complaint  Patient presents with  . Chest Pain    HPI Katherine Katrinka BlazingSmith is a 53 y.o. female.     53 year old female with prior medical history as detailed below presents for evaluation of reported chest discomfort following use of cocaine.  Patient reports that she used cocaine earlier today.  Subsequently she developed diffuse chest discomfort.  She is now chest pain-free.  She reports that she uses cocaine on a near daily basis.  She routinely will get some degree of chest discomfort during and shortly thereafter using cocaine.  She denies associated fever or cough.  She denies shortness of breath.  She is a frequent user her ED.  Her presentation today is consistent with multiple prior recent presentations.  The history is provided by the patient and medical records.  Chest Pain  Pain location:  Substernal area Pain quality: aching   Pain radiates to:  Does not radiate Pain severity:  Mild Onset quality:  Sudden Timing:  Constant Progression:  Resolved Chronicity:  Recurrent Context: drug use   Relieved by:  Nothing Worsened by:  Nothing   Past Medical History:  Diagnosis Date  . Diabetes mellitus without complication (HCC)   . Drug abuse (HCC) 09/22/2018  . GERD (gastroesophageal reflux disease)   . Hypertension   . PTSD (post-traumatic stress disorder)   . Schizo affective schizophrenia [facility](HCC)     Patient Active Problem List   Diagnosis Date Noted  . Cocaine dependence (HCC) 08/18/2018  . Major depressive disorder, recurrent severe without psychotic features (HCC) 08/18/2018    Past Surgical History:  Procedure Laterality Date  . SKIN GRAFT       OB History   No obstetric history on file.      Home Medications    Prior to Admission medications   Medication Sig Start Date End Date Taking? Authorizing  Provider  ARIPiprazole (ABILIFY) 5 MG tablet Take 1 tablet (5 mg total) by mouth daily. 08/20/18   Charm RingsLord, Jamison Y, NP  ciprofloxacin (CIPRO) 500 MG tablet Take 1 tablet (500 mg total) by mouth 2 (two) times daily. 09/01/18   Garlon HatchetSanders, Lisa M, PA-C  gabapentin (NEURONTIN) 300 MG capsule Take 1 capsule (300 mg total) by mouth 3 (three) times daily. 08/19/18   Charm RingsLord, Jamison Y, NP  lisinopril (PRINIVIL,ZESTRIL) 20 MG tablet Take 1 tablet (20 mg total) by mouth daily. 08/20/18   Charm RingsLord, Jamison Y, NP  metFORMIN (GLUCOPHAGE) 500 MG tablet Take 1 tablet (500 mg total) by mouth 2 (two) times daily with a meal. Patient not taking: Reported on 08/19/2018 08/04/18   Virgina Norfolkuratolo, Adam, DO  metFORMIN (GLUCOPHAGE) 500 MG tablet Take 1 tablet (500 mg total) by mouth 2 (two) times daily with a meal. 08/19/18   Charm RingsLord, Jamison Y, NP  sertraline (ZOLOFT) 25 MG tablet Take 1 tablet (25 mg total) by mouth daily. 08/20/18   Charm RingsLord, Jamison Y, NP    Family History Family History  Family history unknown: Yes    Social History Social History   Tobacco Use  . Smoking status: Current Every Day Smoker    Packs/day: 1.50  . Smokeless tobacco: Never Used  Substance Use Topics  . Alcohol use: Not Currently    Frequency: Never    Comment: BAC not available at time of assessment  . Drug use: Yes  Frequency: 7.0 times per week    Types: Cocaine, "Crack" cocaine    Comment: last used AM 09-22-2018     Allergies   Sulfa antibiotics and Latex   Review of Systems Review of Systems  Cardiovascular: Positive for chest pain.  All other systems reviewed and are negative.    Physical Exam Updated Vital Signs BP (!) 177/109 (BP Location: Left Arm)   Pulse 76   Temp 98.1 F (36.7 C) (Oral)   Resp 16   Ht  (1.753 m)   Wt 109.8 kg   SpO2 98%   BMI 35.74 kg/m   Physical Exam Vitals signs and nursing note reviewed.  Constitutional:      General: She is not in acute distress.    Appearance: She is well-developed.   HENT:     Head: Normocephalic and atraumatic.  Eyes:     Conjunctiva/sclera: Conjunctivae normal.     Pupils: Pupils are equal, round, and reactive to light.  Neck:     Musculoskeletal: Normal range of motion and neck supple.  Cardiovascular:     Rate and Rhythm: Normal rate and regular rhythm.     Heart sounds: Normal heart sounds.  Pulmonary:     Effort: Pulmonary effort is normal. No respiratory distress.     Breath sounds: Normal breath sounds.  Abdominal:     General: There is no distension.     Palpations: Abdomen is soft.     Tenderness: There is no abdominal tenderness.  Musculoskeletal: Normal range of motion.        General: No deformity.  Skin:    General: Skin is warm and dry.  Neurological:     General: No focal deficit present.     Mental Status: She is alert and oriented to person, place, and time.      ED Treatments / Results  Labs (all labs ordered are listed, but only abnormal results are displayed) Labs Reviewed  BASIC METABOLIC PANEL - Abnormal; Notable for the following components:      Result Value   Sodium 134 (*)    Glucose, Bld 353 (*)    All other components within normal limits  RAPID URINE DRUG SCREEN, HOSP PERFORMED - Abnormal; Notable for the following components:   Cocaine POSITIVE (*)    All other components within normal limits  CBG MONITORING, ED - Abnormal; Notable for the following components:   Glucose-Capillary 332 (*)    All other components within normal limits  CBC WITH DIFFERENTIAL/PLATELET  TROPONIN I    EKG EKG Interpretation  Date/Time:  Monday Sep 29 2018 16:37:49 EDT Ventricular Rate:  75 PR Interval:    QRS Duration: 75 QT Interval:  391 QTC Calculation: 437 R Axis:   63 Text Interpretation:  Sinus rhythm Abnormal R-wave progression, early transition Baseline wander in lead(s) I II III aVR aVL V1 V2 V3 V4 V5 V6 Confirmed by Kristine Royal [id number]) on 09/29/2018 4:53:05 PM   Radiology Dg Chest Port 1 View   Result Date: 09/29/2018 CLINICAL DATA:  Chest pain EXAM: PORTABLE CHEST 1 VIEW COMPARISON:  Chest x-ray dated 04/30 2020 FINDINGS: The cardiac silhouette is mildly enlarged. There is no pneumothorax. No large pleural effusion. No large area of consolidation. No acute osseous abnormality. IMPRESSION: No active disease. Electronically Signed   By: Katherine Mantle M.D.   On: 09/29/2018 17:06    Procedures Procedures (including critical care time)  Medications Ordered in ED Medications - No data to display  Initial Impression / Assessment and Plan / ED Course  I have reviewed the triage vital signs and the nursing notes.  Pertinent labs & imaging results that were available during my care of the patient were reviewed by me and considered in my medical decision making (see chart for details).        MDM  Screen complete  Le Oberholtzer was evaluated in Emergency Department on 09/29/2018 for the symptoms described in the history of present illness. She was evaluated in the context of the global COVID-19 pandemic, which necessitated consideration that the patient might be at risk for infection with the SARS-CoV-2 virus that causes COVID-19. Institutional protocols and algorithms that pertain to the evaluation of patients at risk for COVID-19 are in a state of rapid change based on information released by regulatory bodies including the CDC and federal and state organizations. These policies and algorithms were followed during the patient's care in the ED.  Patient is presenting for evaluation of chest pain associated with recent cocaine use.  EKG is without evidence of acute ischemia.  Other screening labs are without significant abnormality.  Midway through patient's ED work-up the patient has decided to leave AMA. She essentially got up and walked out of the ED.   The patient has capacity to refuse care.  She does understand the need for close follow-up. She is advised to return to the  ED if her symptoms return or if she desires further treatment.    Final Clinical Impressions(s) / ED Diagnoses   Final diagnoses:  Chest pain, unspecified type  Cocaine abuse [facility])    ED Discharge Orders    None       Wynetta Fines, MD 09/29/18 [phone]

## 2018-09-29 NOTE — ED Triage Notes (Signed)
Pt BIB [facility] for eval of recurrent chest pain following crack use. Pt states that she used crack just prior to calling EMS and reported her CP started at that time. Pt rec'd 324 ASA by EMS w/ resolution of chest pain.

## 2018-09-29 NOTE — ED Notes (Signed)
Pt requested blood work to be drawn from the IV

## 2018-09-29 NOTE — ED Notes (Signed)
Pt does not want me to stick her for blood. She wants to wait on the nurse to see if she can get it from her IV.

## 2018-10-02 ENCOUNTER — Other Ambulatory Visit: Payer: Self-pay

## 2018-10-02 ENCOUNTER — Emergency Department (HOSPITAL_COMMUNITY)
Admission: EM | Admit: 2018-10-02 | Discharge: 2018-10-03 | Disposition: A | Discharge status: Home / Self Care | Payer: Medicaid Other | Attending: Emergency Medicine | Admitting: Emergency Medicine

## 2018-10-02 ENCOUNTER — Encounter (HOSPITAL_COMMUNITY): Payer: Self-pay | Admitting: Emergency Medicine

## 2018-10-02 DIAGNOSIS — Z590 Homelessness: Secondary | ICD-10-CM | POA: Insufficient documentation

## 2018-10-02 DIAGNOSIS — I10 Essential (primary) hypertension: Secondary | ICD-10-CM | POA: Insufficient documentation

## 2018-10-02 DIAGNOSIS — Z79899 Other long term (current) drug therapy: Secondary | ICD-10-CM | POA: Diagnosis not present

## 2018-10-02 DIAGNOSIS — Z7984 Long term (current) use of oral hypoglycemic drugs: Secondary | ICD-10-CM | POA: Diagnosis not present

## 2018-10-02 DIAGNOSIS — E1165 Type 2 diabetes mellitus with hyperglycemia: Secondary | ICD-10-CM | POA: Diagnosis not present

## 2018-10-02 DIAGNOSIS — Z91040 Latex allergy status: Secondary | ICD-10-CM | POA: Diagnosis not present

## 2018-10-02 DIAGNOSIS — F17200 Nicotine dependence, unspecified, uncomplicated: Secondary | ICD-10-CM | POA: Diagnosis not present

## 2018-10-02 DIAGNOSIS — G629 Polyneuropathy, unspecified: Secondary | ICD-10-CM | POA: Diagnosis present

## 2018-10-02 DIAGNOSIS — R739 Hyperglycemia, unspecified: Secondary | ICD-10-CM

## 2018-10-02 NOTE — ED Triage Notes (Signed)
Per EMS, patient is homeless, c/o joint pain. Hx arthritis and neuropathy. Reports she has been walking around a lot. States she has been non compliant with meds x6 months. CBG 449 with EMS.

## 2018-10-02 NOTE — ED Provider Notes (Signed)
[facility]-EMERGENCY DEPT Provider Note   CSN: [id number] Arrival date & time: 10/02/18  2040    History   Chief Complaint Chief Complaint  Patient presents with  . Joint Pain    HPI Katherine Franklin is a 53 y.o. female.     HPI Patient presents to the emergency department with neuropathy.  The patient states that she is not been taking her medications over the last several months.  The patient states that she has someone that will buy her medications for her.  Patient states that she has been walking a lot because she is homeless.  Patient states that nothing seems to make the condition better or worse.  The patient denies chest pain, shortness of breath, headache,blurred vision, neck pain, fever, cough, weakness, numbness, dizziness, anorexia, edema, abdominal pain, nausea, vomiting, diarrhea, rash, back pain, dysuria, hematemesis, bloody stool, near syncope, or syncope. Past Medical History:  Diagnosis Date  . Diabetes mellitus without complication (HCC)   . Drug abuse (HCC) 09/22/2018  . GERD (gastroesophageal reflux disease)   . Hypertension   . PTSD (post-traumatic stress disorder)   . Schizo affective schizophrenia [facility])     Patient Active Problem List   Diagnosis Date Noted  . Cocaine dependence (HCC) 08/18/2018  . Major depressive disorder, recurrent severe without psychotic features (HCC) 08/18/2018    Past Surgical History:  Procedure Laterality Date  . SKIN GRAFT       OB History   No obstetric history on file.      Home Medications    Prior to Admission medications   Medication Sig Start Date End Date Taking? Authorizing Provider  ARIPiprazole (ABILIFY) 5 MG tablet Take 1 tablet (5 mg total) by mouth daily. 08/20/18   Charm Rings, NP  ciprofloxacin (CIPRO) 500 MG tablet Take 1 tablet (500 mg total) by mouth 2 (two) times daily. 09/01/18   Garlon Hatchet, PA-C  gabapentin (NEURONTIN) 300 MG capsule Take 1 capsule (300 mg total) by  mouth 3 (three) times daily. 08/19/18   Charm Rings, NP  lisinopril (PRINIVIL,ZESTRIL) 20 MG tablet Take 1 tablet (20 mg total) by mouth daily. 08/20/18   Charm Rings, NP  metFORMIN (GLUCOPHAGE) 500 MG tablet Take 1 tablet (500 mg total) by mouth 2 (two) times daily with a meal. Patient not taking: Reported on 08/19/2018 08/04/18   Virgina Norfolk, DO  metFORMIN (GLUCOPHAGE) 500 MG tablet Take 1 tablet (500 mg total) by mouth 2 (two) times daily with a meal. 08/19/18   Charm Rings, NP  sertraline (ZOLOFT) 25 MG tablet Take 1 tablet (25 mg total) by mouth daily. 08/20/18   Charm Rings, NP    Family History Family History  Family history unknown: Yes    Social History Social History   Tobacco Use  . Smoking status: Current Every Day Smoker    Packs/day: 1.50  . Smokeless tobacco: Never Used  Substance Use Topics  . Alcohol use: Not Currently    Frequency: Never    Comment: BAC not available at time of assessment  . Drug use: Yes    Frequency: 7.0 times per week    Types: Cocaine, "Crack" cocaine    Comment: last used AM 09-22-2018     Allergies   Sulfa antibiotics and Latex   Review of Systems Review of Systems All other systems negative except as documented in the HPI. All pertinent positives and negatives as reviewed in the HPI.  Physical Exam  Updated Vital Signs BP (!) 157/99 (BP Location: Left Arm)   Pulse 99   Temp 99 F (37.2 C) (Oral)   Resp 20   SpO2 100%   Physical Exam Vitals signs and nursing note reviewed.  Constitutional:      General: She is not in acute distress.    Appearance: Normal appearance. She is well-developed.  HENT:     Head: Normocephalic and atraumatic.  Eyes:     Pupils: Pupils are equal, round, and reactive to light.  Neck:     Musculoskeletal: Normal range of motion.  Cardiovascular:     Rate and Rhythm: Normal rate and regular rhythm.     Pulses: Normal pulses.  Pulmonary:     Effort: Pulmonary effort is normal.  Skin:     General: Skin is warm and dry.  Neurological:     Mental Status: She is alert and oriented to person, place, and time.      ED Treatments / Results  Labs (all labs ordered are listed, but only abnormal results are displayed) Labs Reviewed  BASIC METABOLIC PANEL  CBC WITH DIFFERENTIAL/PLATELET    EKG None  Radiology No results found.  Procedures Procedures (including critical care time)  Medications Ordered in ED Medications - No data to display   Initial Impression / Assessment and Plan / ED Course  I have reviewed the triage vital signs and the nursing notes.  Pertinent labs & imaging results that were available during my care of the patient were reviewed by me and considered in my medical decision making (see chart for details).      We will check laboratory testing on the patient to ensure that she is not in DKA.  If this is negative she will be discharged and advised to return here as needed.   Final Clinical Impressions(s) / ED Diagnoses   Final diagnoses:  None    ED Discharge Orders    None       Charlestine NightLawyer, Jamiyah Dingley, PA-C 10/02/18 2339    Alvira MondaySchlossman, Erin, MD 10/03/18 0010

## 2018-10-03 LAB — CBC WITH DIFFERENTIAL/PLATELET
Abs Immature Granulocytes: 0.01 10*3/uL (ref 0.00–0.07)
Basophils Absolute: 0.1 10*3/uL (ref 0.0–0.1)
Basophils Relative: 1 %
Eosinophils Absolute: 0.1 10*3/uL (ref 0.0–0.5)
Eosinophils Relative: 3 %
HCT: 40.9 % (ref 36.0–46.0)
Hemoglobin: 13 g/dL (ref 12.0–15.0)
Immature Granulocytes: 0 %
Lymphocytes Relative: 43 %
Lymphs Abs: 2.3 10*3/uL (ref 0.7–4.0)
MCH: 27.6 pg (ref 26.0–34.0)
MCHC: 31.8 g/dL (ref 30.0–36.0)
MCV: 86.8 fL (ref 80.0–100.0)
Monocytes Absolute: 0.5 10*3/uL (ref 0.1–1.0)
Monocytes Relative: 9 %
Neutro Abs: 2.4 10*3/uL (ref 1.7–7.7)
Neutrophils Relative %: 44 %
Platelets: 268 10*3/uL (ref 150–400)
RBC: 4.71 MIL/uL (ref 3.87–5.11)
RDW: 13.9 % (ref 11.5–15.5)
WBC: 5.3 10*3/uL (ref 4.0–10.5)
nRBC: 0 % (ref 0.0–0.2)

## 2018-10-03 LAB — BASIC METABOLIC PANEL
Anion gap: 9 (ref 5–15)
BUN: 16 mg/dL (ref 6–20)
CO2: 27 mmol/L (ref 22–32)
Calcium: 9.4 mg/dL (ref 8.9–10.3)
Chloride: 97 mmol/L — ABNORMAL LOW (ref 98–111)
Creatinine, Ser: 1.04 mg/dL — ABNORMAL HIGH (ref 0.44–1.00)
GFR calc Af Amer: >60 mL/min (ref >60)
GFR calc non Af Amer: >60 mL/min (ref >60)
Glucose, Bld: 475 mg/dL — ABNORMAL HIGH (ref 70–99)
Potassium: 4.4 mmol/L (ref 3.5–5.1)
Sodium: 133 mmol/L — ABNORMAL LOW (ref 135–145)

## 2018-10-03 LAB — CBG MONITORING, ED: Glucose-Capillary: 409 mg/dL — ABNORMAL HIGH (ref 70–99)

## 2018-10-03 MED ORDER — INSULIN ASPART 100 UNIT/ML ~~LOC~~ SOLN
4.0000 [IU] | Freq: Once | SUBCUTANEOUS | Status: AC
  Administered 2018-10-03: 01:00:00 4 [IU] via SUBCUTANEOUS
  Filled 2018-10-03: qty 1

## 2018-10-03 NOTE — Discharge Instructions (Addendum)
Thank you for allowing me to care for you today in the Emergency Department.   Take 650 mg of Tylenol once every 6 hours for pain control.  Continue to take your home gabapentin as prescribed.  Capsaicin cream is also available over-the-counter and can be used as directed on areas that are painful or sore.  Return to the emergency department if you develop severe abdominal pain, persistent vomiting, if you develop fevers or chills, if your legs or feet get red, hot to the touch, or swollen.

## 2018-10-03 NOTE — ED Provider Notes (Signed)
53 year old female received a signout from Katherine Franklin pending labs.  Per his HPI:  "Patient presents to the emergency department with neuropathy.  The patient states that she is not been taking her medications over the last several months.  The patient states that she has someone that will buy her medications for her.  Patient states that she has been walking a lot because she is homeless.  Patient states that nothing seems to make the condition better or worse.  The patient denies chest pain, shortness of breath, headache,blurred vision, neck pain, fever, cough, weakness, numbness, dizziness, anorexia, edema, abdominal pain, nausea, vomiting, diarrhea, rash, back pain, dysuria, hematemesis, bloody stool, near syncope, or syncope."  Physical Exam  BP (!) 157/99 (BP Location: Left Arm)   Pulse 99   Temp 99 F (37.2 C) (Oral)   Resp 20   SpO2 100%   Physical Exam Vitals signs and nursing note reviewed.  Constitutional:      General: She is not in acute distress.    Appearance: She is well-developed. She is not ill-appearing or toxic-appearing.     Comments: Laying in bed comfortably.  HENT:     Head: Normocephalic and atraumatic.  Neck:     Musculoskeletal: Normal range of motion.  Musculoskeletal: Normal range of motion.  Neurological:     Mental Status: She is alert.     Comments: Answers questions appropriately.     ED Course/Procedures     Procedures  MDM  53 year old female with a history of homelessness, diabetes mellitus, hypertension, GERD, schizoaffective disorder, PTSD, and drug abuse received at signout from Katherine Franklin pending labs.  He is here for neuropathy and has not been taking her home gabapentin.  CBG was noted to be 449 with EMS.  Glucose is 475 with normal anion gap and bicarb.  Was given 4 units of subcu NovoLog with improvement of her blood glucose.  She will be discharged to follow-up with Katherine Franklin at the [facility].  I discussed with the patient that she may be  able to receive assistance with Katherine Franklin in getting her home medications refilled.  At this time I have a low suspicion for CHF, septic joint, gout, occult fracture, DKA, HHS, cellulitis.  She is hemodynamically stable and in no acute distress.  Safe for discharge with outpatient follow-up.       Katherine Boards, PA-C 10/03/18 8185    Franklin, April, MD 10/04/18 6314

## 2018-10-05 ENCOUNTER — Other Ambulatory Visit: Payer: Self-pay

## 2018-10-05 ENCOUNTER — Encounter (HOSPITAL_COMMUNITY): Payer: Self-pay | Admitting: Emergency Medicine

## 2018-10-05 ENCOUNTER — Emergency Department (HOSPITAL_COMMUNITY)
Admission: EM | Admit: 2018-10-05 | Discharge: 2018-10-05 | Discharge status: Against Medical Advice | Payer: Medicaid Other | Attending: Emergency Medicine | Admitting: Emergency Medicine

## 2018-10-05 DIAGNOSIS — I10 Essential (primary) hypertension: Secondary | ICD-10-CM | POA: Insufficient documentation

## 2018-10-05 DIAGNOSIS — M79672 Pain in left foot: Secondary | ICD-10-CM | POA: Diagnosis not present

## 2018-10-05 DIAGNOSIS — R739 Hyperglycemia, unspecified: Secondary | ICD-10-CM | POA: Diagnosis present

## 2018-10-05 DIAGNOSIS — Z79899 Other long term (current) drug therapy: Secondary | ICD-10-CM | POA: Insufficient documentation

## 2018-10-05 DIAGNOSIS — E1165 Type 2 diabetes mellitus with hyperglycemia: Secondary | ICD-10-CM | POA: Insufficient documentation

## 2018-10-05 DIAGNOSIS — F1410 Cocaine abuse, uncomplicated: Secondary | ICD-10-CM | POA: Diagnosis not present

## 2018-10-05 DIAGNOSIS — K1379 Other lesions of oral mucosa: Secondary | ICD-10-CM | POA: Insufficient documentation

## 2018-10-05 DIAGNOSIS — Z7984 Long term (current) use of oral hypoglycemic drugs: Secondary | ICD-10-CM | POA: Diagnosis not present

## 2018-10-05 DIAGNOSIS — F259 Schizoaffective disorder, unspecified: Secondary | ICD-10-CM | POA: Insufficient documentation

## 2018-10-05 DIAGNOSIS — M79671 Pain in right foot: Secondary | ICD-10-CM | POA: Insufficient documentation

## 2018-10-05 DIAGNOSIS — Z91040 Latex allergy status: Secondary | ICD-10-CM | POA: Diagnosis not present

## 2018-10-05 DIAGNOSIS — F17210 Nicotine dependence, cigarettes, uncomplicated: Secondary | ICD-10-CM | POA: Diagnosis not present

## 2018-10-05 LAB — CBG MONITORING, ED: Glucose-Capillary: 490 mg/dL — ABNORMAL HIGH (ref 70–99)

## 2018-10-05 MED ORDER — MAGIC MOUTHWASH
10.0000 mL | Freq: Once | ORAL | Status: DC
  Filled 2018-10-05: qty 10

## 2018-10-05 MED ORDER — SODIUM CHLORIDE 0.9 % IV BOLUS: 1000.0000 mL | Freq: Once | INTRAVENOUS | Status: DC

## 2018-10-05 MED ORDER — GABAPENTIN 100 MG PO CAPS
100.0000 mg | ORAL_CAPSULE | Freq: Once | ORAL | Status: DC
  Filled 2018-10-05: qty 1

## 2018-10-05 NOTE — ED Provider Notes (Signed)
[facility]   CSN: [id number] Arrival date & time:       History   Chief Complaint Chief Complaint  Patient presents with  . BH patient    HPI Katherine Franklin is a 53 y.o. female.     The history is provided by the patient and medical records. No language interpreter was used.  Hyperglycemia     53 year old female with history of polysubstance abuse, schizophrenia, diabetes, hypertension [place] via [place] park [place] she was found naked sleeping.  Patient also has history of diabetes and noncompliance.  CBG initially by EMS was 540.  This time, patient refusing cares.  Her primary complaint is blister in her lips which has been an ongoing issue for many years.  She attributes to fever blister has been waxing waning and requesting treatment for that.  She also complaining of bilateral foot pain and states is due to her neuropathy.  She requesting help for that and states that this pain is been an ongoing issue for months.  She was seen in the ED several days ago for similar complaint.  Otherwise, patient does not want any further intervention of her elevated blood sugar.  She denies any SI or HI.  She denies any significant chest pain trouble breathing abdominal pain or back pain.  She denies nausea vomiting diarrhea.  When asked why she was sleeping naked, she states she wants to feel comfortable.  Past Medical History:  Diagnosis Date  . Diabetes mellitus without complication (HCC)   . Drug abuse (HCC) 09/22/2018  . GERD (gastroesophageal reflux disease)   . Hypertension   . PTSD (post-traumatic stress disorder)   . Schizo affective schizophrenia [facility](HCC)     Patient Active Problem List   Diagnosis Date Noted  . Cocaine dependence (HCC) 08/18/2018  . Major depressive disorder, recurrent severe without psychotic features (HCC) 08/18/2018    Past Surgical History:  Procedure Laterality Date  . SKIN GRAFT        OB History   No obstetric history on file.      Home Medications    Prior to Admission medications   Medication Sig Start Date End Date Taking? Authorizing Provider  ARIPiprazole (ABILIFY) 5 MG tablet Take 1 tablet (5 mg total) by mouth daily. 08/20/18   Charm RingsLord, Jamison Y, NP  ciprofloxacin (CIPRO) 500 MG tablet Take 1 tablet (500 mg total) by mouth 2 (two) times daily. 09/01/18   Garlon HatchetSanders, Lisa M, PA-C  gabapentin (NEURONTIN) 300 MG capsule Take 1 capsule (300 mg total) by mouth 3 (three) times daily. 08/19/18   Charm RingsLord, Jamison Y, NP  lisinopril (PRINIVIL,ZESTRIL) 20 MG tablet Take 1 tablet (20 mg total) by mouth daily. 08/20/18   Charm RingsLord, Jamison Y, NP  metFORMIN (GLUCOPHAGE) 500 MG tablet Take 1 tablet (500 mg total) by mouth 2 (two) times daily with a meal. Patient not taking: Reported on 08/19/2018 08/04/18   Virgina Norfolkuratolo, Adam, DO  metFORMIN (GLUCOPHAGE) 500 MG tablet Take 1 tablet (500 mg total) by mouth 2 (two) times daily with a meal. 08/19/18   Charm RingsLord, Jamison Y, NP  sertraline (ZOLOFT) 25 MG tablet Take 1 tablet (25 mg total) by mouth daily. 08/20/18   Charm RingsLord, Jamison Y, NP    Family History Family History  Family history unknown: Yes    Social History Social History   Tobacco Use  . Smoking status: Current Every Day Smoker    Packs/day: 1.50  .  Smokeless tobacco: Never Used  Substance Use Topics  . Alcohol use: Not Currently    Frequency: Never    Comment: BAC not available at time of assessment  . Drug use: Yes    Frequency: 7.0 times per week    Types: Cocaine, "Crack" cocaine    Comment: last used AM 09-22-2018     Allergies   Sulfa antibiotics and Latex   Review of Systems Review of Systems  All other systems reviewed and are negative.    Physical Exam Updated Vital Signs BP 123/88 (BP Location: Right Arm)   Pulse 85   Temp 98.4 F (36.9 C) (Oral)   Resp 20   Ht  (1.753 m)   Wt 109.8 kg   SpO2 100%   BMI 35.74 kg/m   Physical Exam Vitals signs and nursing  note reviewed.  Constitutional:      General: She is not in acute distress.    Appearance: She is well-developed.  HENT:     Head: Atraumatic.     Comments: Macerated and ulcerated mucosal lesion about the lower lip appears chronic.  No tongue involvement.  No thrush. Eyes:     Conjunctiva/sclera: Conjunctivae normal.  Neck:     Musculoskeletal: Neck supple.  Cardiovascular:     Rate and Rhythm: Normal rate and regular rhythm.     Pulses: Normal pulses.     Heart sounds: Normal heart sounds.  Pulmonary:     Breath sounds: Normal breath sounds.  Abdominal:     Palpations: Abdomen is soft.     Tenderness: There is no abdominal tenderness.  Musculoskeletal:     Comments: Inspection of both feet with normal skin appearance no signs of infection.  No edema.  Pedal pulse palpable bilaterally.  Skin:    Findings: No rash.  Neurological:     Mental Status: She is alert and oriented to person, place, and time.  Psychiatric:        Mood and Affect: Mood normal. Affect is labile.        Speech: Speech normal.        Behavior: Behavior is agitated.        Thought Content: Thought content does not include homicidal or suicidal ideation.      ED Treatments / Results  Labs (all labs ordered are listed, but only abnormal results are displayed) Labs Reviewed  CBG MONITORING, ED - Abnormal; Notable for the following components:      Result Value   Glucose-Capillary 490 (*)    All other components within normal limits    EKG None  Radiology No results found.  Procedures Procedures (including critical care time)  Medications Ordered in ED Medications  magic mouthwash (has no administration in time range)  gabapentin (NEURONTIN) capsule 100 mg (100 mg Oral Not Given 10/05/18 0954)     Initial Impression / Assessment and Plan / ED Course  I have reviewed the triage vital signs and the nursing notes.  Pertinent labs & imaging results that were available during my care of the  patient were reviewed by me and considered in my medical decision making (see chart for details).        BP 123/88 (BP Location: Right Arm)   Pulse 85   Temp 98.4 F (36.9 C) (Oral)   Resp 20   Ht  (1.753 m)   Wt 109.8 kg   SpO2 100%   BMI 35.74 kg/m    Final Clinical Impressions(s) /  ED Diagnoses   Final diagnoses:  Hyperglycemia  Mouth sore    ED Discharge Orders    None     9:29 AM Patient is homeless, brought here because she was [place] naked in the park.  She was found to be hyperglycemic with a initial CBG of 540.  Patient however does not want any specific treatment for her diabetes and refusing IV stick.  9:58 AM Nurse notified pt has eloped.   Fayrene Helper, PA-C 10/05/18 2449    Maia Plan, MD 10/06/18 [phone]

## 2018-10-05 NOTE — ED Triage Notes (Signed)
[place] [facility] [place], patient was found naked and asleep. cbg was 540 with hx of diabetes and non compliance. Hr 80 temp 98.2 bp 162/96. Pt alert and oriented, resp e/u.

## 2018-10-05 NOTE — ED Notes (Signed)
Pt became upset and belligerent waiting for medication and decided to walk out. ED provider notified.

## 2018-11-18 ENCOUNTER — Emergency Department (HOSPITAL_COMMUNITY)
Admission: EM | Admit: 2018-11-18 | Discharge: 2018-11-18 | Payer: Medicaid Other | Attending: Emergency Medicine | Admitting: Emergency Medicine

## 2018-11-18 ENCOUNTER — Encounter (HOSPITAL_COMMUNITY): Payer: Self-pay

## 2018-11-18 DIAGNOSIS — X838XXA Intentional self-harm by other specified means, initial encounter: Secondary | ICD-10-CM | POA: Insufficient documentation

## 2018-11-18 DIAGNOSIS — I10 Essential (primary) hypertension: Secondary | ICD-10-CM | POA: Diagnosis not present

## 2018-11-18 DIAGNOSIS — T1491XA Suicide attempt, initial encounter: Secondary | ICD-10-CM | POA: Diagnosis present

## 2018-11-18 DIAGNOSIS — Z91040 Latex allergy status: Secondary | ICD-10-CM | POA: Diagnosis not present

## 2018-11-18 DIAGNOSIS — Y999 Unspecified external cause status: Secondary | ICD-10-CM | POA: Insufficient documentation

## 2018-11-18 DIAGNOSIS — T71162A Asphyxiation due to hanging, intentional self-harm, initial encounter: Secondary | ICD-10-CM | POA: Insufficient documentation

## 2018-11-18 DIAGNOSIS — E119 Type 2 diabetes mellitus without complications: Secondary | ICD-10-CM | POA: Diagnosis not present

## 2018-11-18 DIAGNOSIS — Y92149 Unspecified place in prison as the place of occurrence of the external cause: Secondary | ICD-10-CM | POA: Diagnosis not present

## 2018-11-18 DIAGNOSIS — F17200 Nicotine dependence, unspecified, uncomplicated: Secondary | ICD-10-CM | POA: Insufficient documentation

## 2018-11-18 DIAGNOSIS — Y9389 Activity, other specified: Secondary | ICD-10-CM | POA: Diagnosis not present

## 2018-11-18 LAB — CBG MONITORING, ED: Glucose-Capillary: 216 mg/dL — ABNORMAL HIGH (ref 70–99)

## 2018-11-18 NOTE — ED Notes (Signed)
Pt is from the jail and tried to hang herself and the doctor there wanted her checked and medically cleared, pt doesn't need evaluated by psych or any mental evaluation, the jail is requesting that she be medically cleared from the attempted hanging and sent back to jail.

## 2018-11-18 NOTE — ED Notes (Signed)
Bed: WA10 Expected date:  Expected time:  Means of arrival:  Comments: 

## 2018-11-18 NOTE — ED Provider Notes (Signed)
TIME SEEN: 6:36 AM  CHIEF COMPLAINT: Attempted hanging  HPI: Patient is a 53 year old female with history of hypertension, diabetes, substance abuse, schizoaffective disorder who presents to the emergency department from jail after an attempted hanging.  Sent here for medical screening only.  Patient is uncooperative with examination.  She would not answer questions or follow commands.  Spoke with nurse, April, at jail who states this is normal for patient.  She states that the patient took a bedsheet and tied it around a ground-level bedpost and tied the sheet around her neck.  They state patient was not off of the ground.  April states that they found patient immediately after tying the bed sheet around the bed.  She states that the patient had no loss of consciousness.  No signs of respiratory distress or stridor.  They are requesting medical evaluation and that we check patient's blood sugar.  If patient is medically cleared, they have a psychiatrist that can see patient in jail.  They do not need psychiatric evaluation here in the ED.  According to her MAR, patient is not on antiplatelets or anticoagulants.  ROS: Level 5 caveat as patient is uncooperative  PAST MEDICAL HISTORY/PAST SURGICAL HISTORY:  Past Medical History:  Diagnosis Date  . Diabetes mellitus without complication (HCC)   . Drug abuse (HCC) 09/22/2018  . GERD (gastroesophageal reflux disease)   . Hypertension   . PTSD (post-traumatic stress disorder)   . Schizo affective schizophrenia [facility](HCC)     MEDICATIONS:  Prior to Admission medications   Medication Sig Start Date End Date Taking? Authorizing Provider  ARIPiprazole (ABILIFY) 5 MG tablet Take 1 tablet (5 mg total) by mouth daily. Patient not taking: Reported on 10/05/2018 08/20/18   Charm RingsLord, Jamison Y, NP  ciprofloxacin (CIPRO) 500 MG tablet Take 1 tablet (500 mg total) by mouth 2 (two) times daily. Patient not taking: Reported on 10/05/2018 09/01/18   Garlon HatchetSanders, Lisa M, PA-C   gabapentin (NEURONTIN) 300 MG capsule Take 1 capsule (300 mg total) by mouth 3 (three) times daily. Patient not taking: Reported on 10/05/2018 08/19/18   Charm RingsLord, Jamison Y, NP  lisinopril (PRINIVIL,ZESTRIL) 20 MG tablet Take 1 tablet (20 mg total) by mouth daily. Patient not taking: Reported on 10/05/2018 08/20/18   Charm RingsLord, Jamison Y, NP  metFORMIN (GLUCOPHAGE) 500 MG tablet Take 1 tablet (500 mg total) by mouth 2 (two) times daily with a meal. Patient not taking: Reported on 08/19/2018 08/04/18   Virgina Norfolkuratolo, Adam, DO  metFORMIN (GLUCOPHAGE) 500 MG tablet Take 1 tablet (500 mg total) by mouth 2 (two) times daily with a meal. Patient not taking: Reported on 10/05/2018 08/19/18   Charm RingsLord, Jamison Y, NP  sertraline (ZOLOFT) 25 MG tablet Take 1 tablet (25 mg total) by mouth daily. Patient not taking: Reported on 10/05/2018 08/20/18   Charm RingsLord, Jamison Y, NP    ALLERGIES:  Allergies  Allergen Reactions  . Sulfa Antibiotics Shortness Of Breath and Rash  . Latex Rash    SOCIAL HISTORY:  Social History   Tobacco Use  . Smoking status: Current Every Day Smoker    Packs/day: 1.50  . Smokeless tobacco: Never Used  Substance Use Topics  . Alcohol use: Not Currently    Frequency: Never    Comment: BAC not available at time of assessment    FAMILY HISTORY: Family History  Family history unknown: Yes    EXAM: BP (!) 161/95 (BP Location: Left Arm)   Pulse 86   Temp 98.2 F (36.8  C) (Axillary)   Resp 16   SpO2 100%  CONSTITUTIONAL: Alert but does not answer questions or follow commands.  She is in no distress. HEAD: Normocephalic; atraumatic EYES: Conjunctivae clear, PERRL, EOMI ENT: normal nose; no rhinorrhea; moist mucous membranes; patient will not open her mouth.  She will not answer questions.  She has no stridor on exam.  I am unable to assess phonation. NECK: Supple, no meningismus, no LAD; no midline spinal tenderness, step-off or deformity; trachea midline, no deformity noted to her neck, no carotid  bruit, no abrasions or swelling, no ecchymosis CARD: RRR; S1 and S2 appreciated; no murmurs, no clicks, no rubs, no gallops RESP: Normal chest excursion without splinting or tachypnea; breath sounds clear and equal bilaterally; no wheezes, no rhonchi, no rales; no hypoxia or respiratory distress, no increased work of breathing CHEST:  chest wall stable, no crepitus or ecchymosis or deformity, nontender to palpation; no flail chest ABD/GI: Normal bowel sounds; non-distended; soft, non-tender, no rebound, no guarding; no ecchymosis or other lesions noted PELVIS:  stable, nontender to palpation BACK:  The back appears normal and is non-tender to palpation, there is no CVA tenderness; no midline spinal tenderness, step-off or deformity EXT: Normal ROM in all joints; non-tender to palpation; no edema; normal capillary refill; no cyanosis, no bony tenderness or bony deformity of patient's extremities, no joint effusion, compartments are soft, extremities are warm and well-perfused, no ecchymosis SKIN: Normal color for age and race; warm NEURO: Moves all extremities equally, she has no obvious facial asymmetry, patient is uncooperative PSYCH: Patient will not answer questions or follow commands.  She is uncooperative.  MEDICAL DECISION MAKING: Patient here for medical evaluation after an attempted hanging.  Nurse at the jail reports that patient was found immediately and there was no loss of consciousness.  She is in no respiratory distress here and has no hypoxia or increased work of breathing.  She has no lesions noted to her neck and appears to be swallowing her secretions without difficulty.  Patient refuses to talk to me.  I do not think the patient has any spinal injury, vascular injury, tracheal injury.  I do not think she needs imaging at this time.  We have checked her blood sugar and it is 216.  Have discussed return precautions with staff at jail.  They state that patient will be seen by their  psychiatric team upon return.  She does not need emergent psychiatric evaluation in the ED.  At this time, I do not feel there is any life-threatening condition present. I have reviewed and discussed all results (EKG, imaging, lab, urine as appropriate) and exam findings with patient/family. I have reviewed nursing notes and appropriate previous records.  I feel the patient is safe to be discharged home without further emergent workup and can continue workup as an outpatient as needed. Discussed usual and customary return precautions. Patient/family verbalize understanding and are comfortable with this plan.  Outpatient follow-up has been provided as needed. All questions have been answered.     Nahum Sherrer, Delice Bison, DO 11/18/18 [phone]

## 2018-11-18 NOTE — Discharge Instructions (Addendum)
Patient's exam today was normal.  Vital signs were normal.  Oxygen level was 100%.  Blood sugar is 216.  I recommend psychiatric evaluation at jail.  If patient develops any neck pain, sore throat, numbness or weakness, changes in her voice, vomiting, neck swelling or bruising, recommend reevaluation by a physician.

## 2019-02-24 ENCOUNTER — Emergency Department (HOSPITAL_COMMUNITY)
Admission: EM | Admit: 2019-02-24 | Discharge: 2019-02-25 | Discharge status: Against Medical Advice | Payer: Medicaid Other | Attending: Emergency Medicine | Admitting: Emergency Medicine

## 2019-02-24 ENCOUNTER — Encounter (HOSPITAL_COMMUNITY): Payer: Self-pay | Admitting: Emergency Medicine

## 2019-02-24 ENCOUNTER — Other Ambulatory Visit: Payer: Self-pay

## 2019-02-24 DIAGNOSIS — R600 Localized edema: Secondary | ICD-10-CM | POA: Diagnosis present

## 2019-02-24 DIAGNOSIS — Z5321 Procedure and treatment not carried out due to patient leaving prior to being seen by health care provider: Secondary | ICD-10-CM | POA: Insufficient documentation

## 2019-02-24 LAB — CBC WITH DIFFERENTIAL/PLATELET
Abs Immature Granulocytes: 0.01 10*3/uL (ref 0.00–0.07)
Basophils Absolute: 0 10*3/uL (ref 0.0–0.1)
Basophils Relative: 1 %
Eosinophils Absolute: 0.1 10*3/uL (ref 0.0–0.5)
Eosinophils Relative: 2 %
HCT: 33.7 % — ABNORMAL LOW (ref 36.0–46.0)
Hemoglobin: 11.1 g/dL — ABNORMAL LOW (ref 12.0–15.0)
Immature Granulocytes: 0 %
Lymphocytes Relative: 40 %
Lymphs Abs: 2.1 10*3/uL (ref 0.7–4.0)
MCH: 29.1 pg (ref 26.0–34.0)
MCHC: 32.9 g/dL (ref 30.0–36.0)
MCV: 88.5 fL (ref 80.0–100.0)
Monocytes Absolute: 0.5 10*3/uL (ref 0.1–1.0)
Monocytes Relative: 10 %
Neutro Abs: 2.5 10*3/uL (ref 1.7–7.7)
Neutrophils Relative %: 47 %
Platelets: 278 10*3/uL (ref 150–400)
RBC: 3.81 MIL/uL — ABNORMAL LOW (ref 3.87–5.11)
RDW: 13.9 % (ref 11.5–15.5)
WBC: 5.3 10*3/uL (ref 4.0–10.5)
nRBC: 0 % (ref 0.0–0.2)

## 2019-02-24 LAB — COMPREHENSIVE METABOLIC PANEL
ALT: 19 U/L (ref 0–44)
AST: 20 U/L (ref 15–41)
Albumin: 3.8 g/dL (ref 3.5–5.0)
Alkaline Phosphatase: 81 U/L (ref 38–126)
Anion gap: 11 (ref 5–15)
BUN: 18 mg/dL (ref 6–20)
CO2: 26 mmol/L (ref 22–32)
Calcium: 9.4 mg/dL (ref 8.9–10.3)
Chloride: 100 mmol/L (ref 98–111)
Creatinine, Ser: 1.03 mg/dL — ABNORMAL HIGH (ref 0.44–1.00)
GFR calc Af Amer: >60 mL/min (ref >60)
GFR calc non Af Amer: >60 mL/min (ref >60)
Glucose, Bld: 278 mg/dL — ABNORMAL HIGH (ref 70–99)
Potassium: 3.9 mmol/L (ref 3.5–5.1)
Sodium: 137 mmol/L (ref 135–145)
Total Bilirubin: 0.4 mg/dL (ref 0.3–1.2)
Total Protein: 7.3 g/dL (ref 6.5–8.1)

## 2019-02-24 LAB — HCG, QUANTITATIVE, PREGNANCY: hCG, Beta Chain, Quant, S: 3 m[IU]/mL (ref <5)

## 2019-02-24 NOTE — ED Triage Notes (Signed)
Patient arrived with EMS from street ( homeless) reports persistent feet swelling for several days , denies injury/ambulatory , drug use " crack" today , CBG= 276 diabetic with no meds for several weeks.

## 2019-02-25 NOTE — ED Notes (Signed)
Pt was called x3 with no response, is presumed to be out of the faciilty and not on the premisis

## 2019-03-05 ENCOUNTER — Encounter (HOSPITAL_COMMUNITY): Payer: Self-pay | Admitting: Emergency Medicine

## 2019-03-05 ENCOUNTER — Other Ambulatory Visit: Payer: Self-pay

## 2019-03-05 DIAGNOSIS — E1165 Type 2 diabetes mellitus with hyperglycemia: Secondary | ICD-10-CM | POA: Insufficient documentation

## 2019-03-05 DIAGNOSIS — Z5320 Procedure and treatment not carried out because of patient's decision for unspecified reasons: Secondary | ICD-10-CM | POA: Insufficient documentation

## 2019-03-05 DIAGNOSIS — F17210 Nicotine dependence, cigarettes, uncomplicated: Secondary | ICD-10-CM | POA: Insufficient documentation

## 2019-03-05 DIAGNOSIS — I10 Essential (primary) hypertension: Secondary | ICD-10-CM | POA: Insufficient documentation

## 2019-03-05 LAB — BASIC METABOLIC PANEL
Anion gap: 9 (ref 5–15)
BUN: 20 mg/dL (ref 6–20)
CO2: 27 mmol/L (ref 22–32)
Calcium: 9.3 mg/dL (ref 8.9–10.3)
Chloride: 100 mmol/L (ref 98–111)
Creatinine, Ser: 1.06 mg/dL — ABNORMAL HIGH (ref 0.44–1.00)
GFR calc Af Amer: >60 mL/min (ref >60)
GFR calc non Af Amer: 59 mL/min — ABNORMAL LOW (ref >60)
Glucose, Bld: 320 mg/dL — ABNORMAL HIGH (ref 70–99)
Potassium: 4.2 mmol/L (ref 3.5–5.1)
Sodium: 136 mmol/L (ref 135–145)

## 2019-03-05 LAB — CBC
HCT: 36.2 % (ref 36.0–46.0)
Hemoglobin: 11.4 g/dL — ABNORMAL LOW (ref 12.0–15.0)
MCH: 27.9 pg (ref 26.0–34.0)
MCHC: 31.5 g/dL (ref 30.0–36.0)
MCV: 88.7 fL (ref 80.0–100.0)
Platelets: 325 10*3/uL (ref 150–400)
RBC: 4.08 MIL/uL (ref 3.87–5.11)
RDW: 13.9 % (ref 11.5–15.5)
WBC: 6.7 10*3/uL (ref 4.0–10.5)
nRBC: 0 % (ref 0.0–0.2)

## 2019-03-05 LAB — CBG MONITORING, ED: Glucose-Capillary: 273 mg/dL — ABNORMAL HIGH (ref 70–99)

## 2019-03-05 NOTE — ED Notes (Signed)
Pt asked to provide urine specimen- pt stated she had just gone to the [facility]. Pt given cup and knows to obtain urine specimen whenever able, and notify staff when a specimen is available.

## 2019-03-05 NOTE — ED Triage Notes (Signed)
Patient brought in by [facility]. Patient complaining of hyperglycemia. Patient just got out of jail. Patient sugar is 273.

## 2019-03-05 NOTE — ED Notes (Signed)
Extra tube sent to lab - dark green

## 2019-03-06 ENCOUNTER — Emergency Department (HOSPITAL_COMMUNITY)
Admission: EM | Admit: 2019-03-06 | Discharge: 2019-03-06 | Disposition: A | Discharge status: Home / Self Care | Payer: Medicaid Other | Attending: Emergency Medicine | Admitting: Emergency Medicine

## 2019-03-06 ENCOUNTER — Encounter (HOSPITAL_COMMUNITY): Payer: Self-pay | Admitting: Emergency Medicine

## 2019-03-06 DIAGNOSIS — R739 Hyperglycemia, unspecified: Secondary | ICD-10-CM

## 2019-03-06 LAB — URINALYSIS, ROUTINE W REFLEX MICROSCOPIC
Bacteria, UA: NONE SEEN
Bilirubin Urine: NEGATIVE
Glucose, UA: >=500 mg/dL — AB
Hgb urine dipstick: NEGATIVE
Ketones, ur: NEGATIVE mg/dL
Leukocytes,Ua: NEGATIVE
Nitrite: NEGATIVE
Protein, ur: NEGATIVE mg/dL
Specific Gravity, Urine: 1.032 — ABNORMAL HIGH (ref 1.005–1.030)
pH: 5 (ref 5.0–8.0)

## 2019-03-06 LAB — CBG MONITORING, ED: Glucose-Capillary: 418 mg/dL — ABNORMAL HIGH (ref 70–99)

## 2019-03-06 MED ORDER — METFORMIN HCL 500 MG PO TABS
500.0000 mg | ORAL_TABLET | ORAL | Status: AC
  Administered 2019-03-06: 01:00:00 500 mg via ORAL
  Filled 2019-03-06: qty 1

## 2019-03-06 MED ORDER — INSULIN ASPART 100 UNIT/ML ~~LOC~~ SOLN
4.0000 [IU] | SUBCUTANEOUS | Status: DC
  Filled 2019-03-06: qty 0.04

## 2019-03-06 NOTE — ED Provider Notes (Signed)
[facility] DEPT Provider Note   CSN: [id number] Arrival date & time: 03/05/19  2130     History   Chief Complaint Chief Complaint  Patient presents with  . Hyperglycemia    HPI Katherine Franklin is a 53 y.o. female.     The history is provided by the patient.  Hyperglycemia Severity:  Moderate Onset quality:  Gradual Timing:  Constant Progression:  Unchanged Chronicity:  Chronic Diabetes status:  Controlled with oral medications Context: noncompliance   Relieved by:  Nothing Ineffective treatments:  None tried Associated symptoms: no abdominal pain, no altered mental status, no chest pain, no dehydration, no dizziness, no fever and no shortness of breath   Risk factors: obesity     Past Medical History:  Diagnosis Date  . Diabetes mellitus without complication ([place])   . Drug abuse ([place]) 09/22/2018  . GERD (gastroesophageal reflux disease)   . Hypertension   . PTSD (post-traumatic stress disorder)   . Schizo affective schizophrenia [facility])     Patient Active Problem List   Diagnosis Date Noted  . Cocaine dependence ([place]) 08/18/2018  . Major depressive disorder, recurrent severe without psychotic features ([place]) 08/18/2018    Past Surgical History:  Procedure Laterality Date  . SKIN GRAFT       OB History   No obstetric history on file.      Home Medications    Prior to Admission medications   Medication Sig Start Date End Date Taking? Authorizing Provider  ARIPiprazole (ABILIFY) 5 MG tablet Take 1 tablet (5 mg total) by mouth daily. Patient not taking: Reported on 10/05/2018 08/20/18   Patrecia Pour, NP  ciprofloxacin (CIPRO) 500 MG tablet Take 1 tablet (500 mg total) by mouth 2 (two) times daily. Patient not taking: Reported on 10/05/2018 09/01/18   Larene Pickett, PA-C  gabapentin (NEURONTIN) 300 MG capsule Take 1 capsule (300 mg total) by mouth 3 (three) times daily. Patient not taking: Reported on 10/05/2018 08/19/18    Patrecia Pour, NP  lisinopril (PRINIVIL,ZESTRIL) 20 MG tablet Take 1 tablet (20 mg total) by mouth daily. Patient not taking: Reported on 10/05/2018 08/20/18   Patrecia Pour, NP  metFORMIN (GLUCOPHAGE) 500 MG tablet Take 1 tablet (500 mg total) by mouth 2 (two) times daily with a meal. Patient not taking: Reported on 08/19/2018 08/04/18   Lennice Sites, DO  metFORMIN (GLUCOPHAGE) 500 MG tablet Take 1 tablet (500 mg total) by mouth 2 (two) times daily with a meal. Patient not taking: Reported on 10/05/2018 08/19/18   Patrecia Pour, NP  sertraline (ZOLOFT) 25 MG tablet Take 1 tablet (25 mg total) by mouth daily. Patient not taking: Reported on 10/05/2018 08/20/18   Patrecia Pour, NP    Family History Family History  Family history unknown: Yes    Social History Social History   Tobacco Use  . Smoking status: Current Every Day Smoker    Packs/day: 1.50  . Smokeless tobacco: Never Used  Substance Use Topics  . Alcohol use: Not Currently    Frequency: Never    Comment: BAC not available at time of assessment  . Drug use: Yes    Frequency: 7.0 times per week    Types: Cocaine, "Crack" cocaine    Comment: last used AM 09-22-2018     Allergies   Sulfa antibiotics and Latex   Review of Systems Review of Systems  Constitutional: Negative for fever.  HENT: Negative for congestion.   Eyes: Negative  for visual disturbance.  Respiratory: Negative for shortness of breath.   Cardiovascular: Negative for chest pain.  Gastrointestinal: Negative for abdominal pain.  Genitourinary: Negative for difficulty urinating.  Musculoskeletal: Negative for arthralgias.  Neurological: Negative for dizziness.  Psychiatric/Behavioral: Negative for hallucinations.  All other systems reviewed and are negative.    Physical Exam Updated Vital Signs BP 128/63   Pulse 92   Temp 99.1 F (37.3 C)   Resp 20   Ht 5\' 7"  (1.702 m)   Wt 109.8 kg   SpO2 97%   BMI 37.90 kg/m   Physical Exam Vitals  signs and nursing note reviewed.  Constitutional:      General: She is not in acute distress.    Appearance: She is obese.     Comments: Patient is refusing an exam or to allow staff to touch her  HENT:     Head: Normocephalic and atraumatic.     Nose: Nose normal.  Eyes:     Extraocular Movements: Extraocular movements intact.  Neck:     Musculoskeletal: Normal range of motion and neck supple.  Musculoskeletal: Normal range of motion.  Neurological:     General: No focal deficit present.     Mental Status: She is alert and oriented to person, place, and time.  Psychiatric:        Mood and Affect: Affect is angry.      ED Treatments / Results  Labs (all labs ordered are listed, but only abnormal results are displayed) Results for orders placed or performed during the hospital encounter of 03/06/19  Basic metabolic panel  Result Value Ref Range   Sodium 136 135 - 145 mmol/L   Potassium 4.2 3.5 - 5.1 mmol/L   Chloride 100 98 - 111 mmol/L   CO2 27 22 - 32 mmol/L   Glucose, Bld 320 (H) 70 - 99 mg/dL   BUN 20 6 - 20 mg/dL   Creatinine, Ser 03/08/19 (H) 0.44 - 1.00 mg/dL   Calcium 9.3 8.9 - 7.58 mg/dL   GFR calc non Af Amer 59 (L) >60 mL/min   GFR calc Af Amer >60 >60 mL/min   Anion gap 9 5 - 15  CBC  Result Value Ref Range   WBC 6.7 4.0 - 10.5 K/uL   RBC 4.08 3.87 - 5.11 MIL/uL   Hemoglobin 11.4 (L) 12.0 - 15.0 g/dL   HCT 83.2 54.9 - 82.6 %   MCV 88.7 80.0 - 100.0 fL   MCH 27.9 26.0 - 34.0 pg   MCHC 31.5 30.0 - 36.0 g/dL   RDW 41.5 83.0 - 94.0 %   Platelets 325 150 - 400 K/uL   nRBC 0.0 0.0 - 0.2 %  Urinalysis, Routine w reflex microscopic  Result Value Ref Range   Color, Urine YELLOW YELLOW   APPearance CLEAR CLEAR   Specific Gravity, Urine 1.032 (H) 1.005 - 1.030   pH 5.0 5.0 - 8.0   Glucose, UA >=500 (A) NEGATIVE mg/dL   Hgb urine dipstick NEGATIVE NEGATIVE   Bilirubin Urine NEGATIVE NEGATIVE   Ketones, ur NEGATIVE NEGATIVE mg/dL   Protein, ur NEGATIVE NEGATIVE  mg/dL   Nitrite NEGATIVE NEGATIVE   Leukocytes,Ua NEGATIVE NEGATIVE   RBC / HPF 0-5 0 - 5 RBC/hpf   WBC, UA 0-5 0 - 5 WBC/hpf   Bacteria, UA NONE SEEN NONE SEEN   Squamous Epithelial / LPF 0-5 0 - 5   Mucus PRESENT   CBG monitoring, ED  Result Value Ref Range  Glucose-Capillary 273 (H) 70 - 99 mg/dL  CBG monitoring, ED  Result Value Ref Range   Glucose-Capillary 418 (H) 70 - 99 mg/dL   No results found.  EKG None  Radiology No results found.  Procedures Procedures (including critical care time)  Medications Ordered in ED Medications  insulin aspart (novoLOG) injection 4 Units (4 Units Subcutaneous Not Given 03/06/19 0107)  metFORMIN (GLUCOPHAGE) tablet 500 mg (500 mg Oral Given 03/06/19 0043)     Patient is refusing insulin or all care.  She is alert and oriented and has capacity to refuse care and intervention.  She understands that the risks of leaving AMA are but are not limited to death and coma.    Katherine Franklin was evaluated in Emergency Department on 03/06/2019 for the symptoms described in the history of present illness. She was evaluated in the context of the global COVID-19 pandemic, which necessitated consideration that the patient might be at risk for infection with the SARS-CoV-2 virus that causes COVID-19. Institutional protocols and algorithms that pertain to the evaluation of patients at risk for COVID-19 are in a state of rapid change based on information released by regulatory bodies including the CDC and federal and state organizations. These policies and algorithms were followed during the patient's care in the ED.   Final Clinical Impressions(s) / ED Diagnoses   Final diagnoses:  Hyperglycemia    Return for weakness, numbness, changes in vision or speech, fevers >100.4 unrelieved by medication, shortness of breath, intractable vomiting, or diarrhea, abdominal pain, Inability to tolerate liquids or food, cough, altered mental status or any concerns.  No signs of systemic illness or infection. The patient is nontoxic-appearing on exam and vital signs are within normal limits.   I have reviewed the triage vital signs and the nursing notes. Pertinent labs &imaging results that were available during my care of the patient were reviewed by me and considered in my medical decision making (see chart for details).  After history, exam, and medical workup I feel the patient has been appropriately medically screened and is safe for discharge home. Pertinent diagnoses were discussed with the patient. Patient was given return precautions      Keyondre Hepburn, MD 03/06/19 0116

## 2019-03-06 NOTE — ED Notes (Addendum)
Pt does not want insulin in her abdomen and she refused an IV, pt stated she just wanted to take a bath, eat, get her medication, and sleep for the night. Pt leaving AMA.

## 2019-03-06 NOTE — Progress Notes (Signed)
Consult request from ED triage RN has been received. CSW attempted to follow up at present time.  Pt still awaiting medical work-up completion.  2nd shift ED CSW will leave handoff for 1st shift ED CSW.  CSW will continue to follow for D/C needs.  Alphonse Guild. Nakia Remmers, LCSW, LCAS, CSI Transitions of Care Clinical Social Worker Care Coordination Department Ph: [phone]

## 2019-03-24 ENCOUNTER — Encounter (HOSPITAL_COMMUNITY): Payer: Self-pay | Admitting: Emergency Medicine

## 2019-03-24 ENCOUNTER — Emergency Department (HOSPITAL_COMMUNITY)
Admission: EM | Admit: 2019-03-24 | Discharge: 2019-03-24 | Disposition: A | Discharge status: Home / Self Care | Payer: Medicaid Other | Attending: Emergency Medicine | Admitting: Emergency Medicine

## 2019-03-24 ENCOUNTER — Other Ambulatory Visit: Payer: Self-pay

## 2019-03-24 DIAGNOSIS — R739 Hyperglycemia, unspecified: Secondary | ICD-10-CM | POA: Insufficient documentation

## 2019-03-24 DIAGNOSIS — Z5321 Procedure and treatment not carried out due to patient leaving prior to being seen by health care provider: Secondary | ICD-10-CM | POA: Diagnosis not present

## 2019-03-24 LAB — URINALYSIS, ROUTINE W REFLEX MICROSCOPIC
Bacteria, UA: NONE SEEN
Bilirubin Urine: NEGATIVE
Glucose, UA: >=500 mg/dL — AB
Hgb urine dipstick: NEGATIVE
Ketones, ur: NEGATIVE mg/dL
Leukocytes,Ua: NEGATIVE
Nitrite: NEGATIVE
Protein, ur: NEGATIVE mg/dL
Specific Gravity, Urine: 1.036 — ABNORMAL HIGH (ref 1.005–1.030)
pH: 5 (ref 5.0–8.0)

## 2019-03-24 LAB — CBC
HCT: 36.6 % (ref 36.0–46.0)
Hemoglobin: 11.3 g/dL — ABNORMAL LOW (ref 12.0–15.0)
MCH: 27.6 pg (ref 26.0–34.0)
MCHC: 30.9 g/dL (ref 30.0–36.0)
MCV: 89.3 fL (ref 80.0–100.0)
Platelets: 289 10*3/uL (ref 150–400)
RBC: 4.1 MIL/uL (ref 3.87–5.11)
RDW: 13.4 % (ref 11.5–15.5)
WBC: 4.8 10*3/uL (ref 4.0–10.5)
nRBC: 0 % (ref 0.0–0.2)

## 2019-03-24 LAB — CBG MONITORING, ED: Glucose-Capillary: 431 mg/dL — ABNORMAL HIGH (ref 70–99)

## 2019-03-24 LAB — BASIC METABOLIC PANEL
Anion gap: 10 (ref 5–15)
BUN: 17 mg/dL (ref 6–20)
CO2: 22 mmol/L (ref 22–32)
Calcium: 8.8 mg/dL — ABNORMAL LOW (ref 8.9–10.3)
Chloride: 103 mmol/L (ref 98–111)
Creatinine, Ser: 0.98 mg/dL (ref 0.44–1.00)
GFR calc Af Amer: >60 mL/min (ref >60)
GFR calc non Af Amer: >60 mL/min (ref >60)
Glucose, Bld: 460 mg/dL — ABNORMAL HIGH (ref 70–99)
Potassium: 3.9 mmol/L (ref 3.5–5.1)
Sodium: 135 mmol/L (ref 135–145)

## 2019-03-24 NOTE — Progress Notes (Signed)
TOC CM chart reviewed: 10 ED visits in 6 months, no PCP, pt homeless  Attempted to speak to pt, but pt wanted did not stay to complete treatment. Pt will need PCP and follow up with [facility] for homeless resources.  [place], [place] ED TOC CM [phone]

## 2019-03-24 NOTE — ED Notes (Addendum)
Pt came out to Nurses station stating "I want to go I am inpatient. I know why my blood sugar is up. When I cant get my crack, I eat gummy bears and drink soda. I want to go home." Pt encouraged to stay. Pt refusing. Pts IV removed. Pt ambulatory out to ED lobby.

## 2019-03-24 NOTE — ED Triage Notes (Addendum)
Per EMS, patient c/o polydipsia and polyuria with hyperglycemia. Has not had diabetes medication in months. Intermittent ED visits for same. Patient is homeless. Hx PTSD.  Patient responding to internal stimuli. Reports Katherine Franklin is talking to the voices in her head.  CBG 521 BP 138/90 HR 90  18g R AC 599ml NS with EMS

## 2019-04-01 ENCOUNTER — Emergency Department (HOSPITAL_COMMUNITY)
Admission: EM | Admit: 2019-04-01 | Discharge: 2019-04-01 | Disposition: A | Discharge status: Home / Self Care | Payer: Medicaid Other | Attending: Emergency Medicine | Admitting: Emergency Medicine

## 2019-04-01 ENCOUNTER — Encounter (HOSPITAL_COMMUNITY): Payer: Self-pay | Admitting: Emergency Medicine

## 2019-04-01 ENCOUNTER — Other Ambulatory Visit: Payer: Self-pay

## 2019-04-01 ENCOUNTER — Emergency Department (HOSPITAL_COMMUNITY): Payer: Medicaid Other

## 2019-04-01 DIAGNOSIS — Z91040 Latex allergy status: Secondary | ICD-10-CM | POA: Diagnosis not present

## 2019-04-01 DIAGNOSIS — Z79899 Other long term (current) drug therapy: Secondary | ICD-10-CM | POA: Insufficient documentation

## 2019-04-01 DIAGNOSIS — F17200 Nicotine dependence, unspecified, uncomplicated: Secondary | ICD-10-CM | POA: Diagnosis not present

## 2019-04-01 DIAGNOSIS — I10 Essential (primary) hypertension: Secondary | ICD-10-CM | POA: Diagnosis not present

## 2019-04-01 DIAGNOSIS — E1165 Type 2 diabetes mellitus with hyperglycemia: Secondary | ICD-10-CM | POA: Insufficient documentation

## 2019-04-01 DIAGNOSIS — R739 Hyperglycemia, unspecified: Secondary | ICD-10-CM | POA: Diagnosis present

## 2019-04-01 DIAGNOSIS — Z7984 Long term (current) use of oral hypoglycemic drugs: Secondary | ICD-10-CM | POA: Diagnosis not present

## 2019-04-01 LAB — BASIC METABOLIC PANEL
Anion gap: 9 (ref 5–15)
BUN: 17 mg/dL (ref 6–20)
CO2: 25 mmol/L (ref 22–32)
Calcium: 9.1 mg/dL (ref 8.9–10.3)
Chloride: 99 mmol/L (ref 98–111)
Creatinine, Ser: 0.79 mg/dL (ref 0.44–1.00)
GFR calc Af Amer: >60 mL/min (ref >60)
GFR calc non Af Amer: >60 mL/min (ref >60)
Glucose, Bld: 412 mg/dL — ABNORMAL HIGH (ref 70–99)
Potassium: 4.2 mmol/L (ref 3.5–5.1)
Sodium: 133 mmol/L — ABNORMAL LOW (ref 135–145)

## 2019-04-01 LAB — CBC
HCT: 38.1 % (ref 36.0–46.0)
Hemoglobin: 12.2 g/dL (ref 12.0–15.0)
MCH: 27.9 pg (ref 26.0–34.0)
MCHC: 32 g/dL (ref 30.0–36.0)
MCV: 87.2 fL (ref 80.0–100.0)
Platelets: 292 10*3/uL (ref 150–400)
RBC: 4.37 MIL/uL (ref 3.87–5.11)
RDW: 12.8 % (ref 11.5–15.5)
WBC: 3.9 10*3/uL — ABNORMAL LOW (ref 4.0–10.5)
nRBC: 0 % (ref 0.0–0.2)

## 2019-04-01 LAB — CBG MONITORING, ED: Glucose-Capillary: 410 mg/dL — ABNORMAL HIGH (ref 70–99)

## 2019-04-01 LAB — I-STAT BETA HCG BLOOD, ED (MC, WL, AP ONLY): I-stat hCG, quantitative: <5 m[IU]/mL (ref <5)

## 2019-04-01 MED ORDER — GABAPENTIN 300 MG PO CAPS: 300.0000 mg | ORAL_CAPSULE | Freq: Three times a day (TID) | ORAL | 0 refills | Status: DC

## 2019-04-01 MED ORDER — METFORMIN HCL 500 MG PO TABS: 500.0000 mg | ORAL_TABLET | Freq: Two times a day (BID) | ORAL | 0 refills | Status: DC

## 2019-04-01 MED ORDER — ARIPIPRAZOLE 5 MG PO TABS: 5.0000 mg | ORAL_TABLET | Freq: Every day | ORAL | 0 refills | Status: DC

## 2019-04-01 MED ORDER — LISINOPRIL 20 MG PO TABS: 20.0000 mg | ORAL_TABLET | Freq: Every day | ORAL | 0 refills | Status: DC

## 2019-04-01 MED ORDER — SERTRALINE HCL 25 MG PO TABS: 25.0000 mg | ORAL_TABLET | Freq: Every day | ORAL | 0 refills | Status: DC

## 2019-04-01 NOTE — ED Provider Notes (Signed)
[facility] EMERGENCY DEPARTMENT Provider Note   CSN: [id number] Arrival date & time: 04/01/19  3220     History   Chief Complaint Chief Complaint  Patient presents with  . Hyperglycemia    HPI Katherine Franklin is a 53 y.o. female.     Patient with history of diabetes, drug abuse, schizophrenia who presents the ED with high blood sugar.  States that she needs new prescriptions for her medications.  Is not having any shortness of breath, chest pain.  Does admit to recent drug use.  States that she has been unable to afford her medications but she now has money for those medications.  She is requesting medication refills and discharged.  The history is provided by the patient.  Hyperglycemia Severity:  Mild Onset quality:  Gradual Timing:  Constant Progression:  Unchanged Chronicity:  Chronic Context: noncompliance   Relieved by:  Nothing Ineffective treatments:  None tried Associated symptoms: no abdominal pain, no chest pain, no dysuria, no fever, no shortness of breath and no vomiting     Past Medical History:  Diagnosis Date  . Diabetes mellitus without complication ([place])   . Drug abuse ([place]) 09/22/2018  . GERD (gastroesophageal reflux disease)   . Hypertension   . PTSD (post-traumatic stress disorder)   . Schizo affective schizophrenia [facility])     Patient Active Problem List   Diagnosis Date Noted  . Cocaine dependence ([place]) 08/18/2018  . Major depressive disorder, recurrent severe without psychotic features ([place]) 08/18/2018    Past Surgical History:  Procedure Laterality Date  . SKIN GRAFT       OB History   No obstetric history on file.      Home Medications    Prior to Admission medications   Medication Sig Start Date End Date Taking? Authorizing Provider  ARIPiprazole (ABILIFY) 5 MG tablet Take 1 tablet (5 mg total) by mouth daily. 04/01/19   Kelaiah Escalona, DO  ciprofloxacin (CIPRO) 500 MG tablet Take 1 tablet (500 mg total) by  mouth 2 (two) times daily. Patient not taking: Reported on 10/05/2018 09/01/18   Larene Pickett, PA-C  gabapentin (NEURONTIN) 300 MG capsule Take 1 capsule (300 mg total) by mouth 3 (three) times daily. 04/01/19   Ancil Dewan, DO  lisinopril (ZESTRIL) 20 MG tablet Take 1 tablet (20 mg total) by mouth daily. 04/01/19   Debarah Mccumbers, DO  metFORMIN (GLUCOPHAGE) 500 MG tablet Take 1 tablet (500 mg total) by mouth 2 (two) times daily with a meal. 04/01/19   Uziel Covault, DO  sertraline (ZOLOFT) 25 MG tablet Take 1 tablet (25 mg total) by mouth daily. 04/01/19   Lennice Sites, DO    Family History Family History  Family history unknown: Yes    Social History Social History   Tobacco Use  . Smoking status: Current Every Day Smoker    Packs/day: 1.50  . Smokeless tobacco: Never Used  Substance Use Topics  . Alcohol use: Not Currently    Frequency: Never    Comment: BAC not available at time of assessment  . Drug use: Yes    Frequency: 7.0 times per week    Types: Cocaine, "Crack" cocaine    Comment: last used AM 09-22-2018     Allergies   Sulfa antibiotics and Latex   Review of Systems Review of Systems  Constitutional: Negative for chills and fever.  HENT: Negative for ear pain and sore throat.   Eyes: Negative for pain and visual disturbance.  Respiratory: Negative for cough and shortness of breath.   Cardiovascular: Negative for chest pain and palpitations.  Gastrointestinal: Negative for abdominal pain and vomiting.  Genitourinary: Negative for dysuria and hematuria.  Musculoskeletal: Negative for arthralgias and back pain.  Skin: Negative for color change and rash.  Neurological: Negative for seizures and syncope.  All other systems reviewed and are negative.    Physical Exam Updated Vital Signs BP (!) 148/91   Pulse 89   Temp 98.6 F (37 C) (Oral)   Resp 18   SpO2 98%   Physical Exam Vitals signs and nursing note reviewed.  Constitutional:       General: She is not in acute distress.    Appearance: She is well-developed.  HENT:     Head: Normocephalic and atraumatic.     Mouth/Throat:     Mouth: Mucous membranes are moist.  Eyes:     Conjunctiva/sclera: Conjunctivae normal.     Pupils: Pupils are equal, round, and reactive to light.  Neck:     Musculoskeletal: Neck supple.  Cardiovascular:     Rate and Rhythm: Normal rate and regular rhythm.     Pulses: Normal pulses.     Heart sounds: Normal heart sounds. No murmur.  Pulmonary:     Effort: Pulmonary effort is normal. No respiratory distress.     Breath sounds: Normal breath sounds.  Abdominal:     Palpations: Abdomen is soft.     Tenderness: There is no abdominal tenderness.  Skin:    General: Skin is warm and dry.     Capillary Refill: Capillary refill takes less than 2 seconds.  Neurological:     General: No focal deficit present.     Mental Status: She is alert.      ED Treatments / Results  Labs (all labs ordered are listed, but only abnormal results are displayed) Labs Reviewed  BASIC METABOLIC PANEL - Abnormal; Notable for the following components:      Result Value   Sodium 133 (*)    Glucose, Bld 412 (*)    All other components within normal limits  CBC - Abnormal; Notable for the following components:   WBC 3.9 (*)    All other components within normal limits  CBG MONITORING, ED - Abnormal; Notable for the following components:   Glucose-Capillary 410 (*)    All other components within normal limits  I-STAT BETA HCG BLOOD, ED ([facility], [facility], [facility] ONLY)    EKG None  Radiology Dg Chest 2 View  Result Date: 04/01/2019 CLINICAL DATA:  Shortness of breath. EXAM: CHEST - 2 VIEW COMPARISON:  Sep 29, 2018. FINDINGS: The heart size and mediastinal contours are within normal limits. Both lungs are clear. No pneumothorax or pleural effusion is noted. The visualized skeletal structures are unremarkable. IMPRESSION: No active cardiopulmonary disease.  Electronically Signed   By: Lupita RaiderJames  Green Jr M.D.   On: 04/01/2019 08:07    Procedures Procedures (including critical care time)  Medications Ordered in ED Medications - No data to display   Initial Impression / Assessment and Plan / ED Course  I have reviewed the triage vital signs and the nursing notes.  Pertinent labs & imaging results that were available during my care of the patient were reviewed by me and considered in my medical decision making (see chart for details).     Katherine Franklin is a 53 year old female with history of diabetes, schizophrenia who presents to the ED with hyperglycemia, want for medication refill.  Patient with unremarkable vitals.  No fever.  Patient with history of noncompliance in the setting of homelessness.  She states that she has money for medications now and would like to get prescriptions for them.  Patient has blood sugar in the 400s but otherwise unremarkable vitals.  Chest x-ray normal.  She denies any chest pain, shortness of breath.  Patient overall is well-appearing.  Does not appear to be in a manic state.  I offered patient IV fluids, possibly insulin but she would like to be discharged with prescriptions.  I believe that this is reasonable.  She is not in DKA.  Discharged with prescriptions given return precautions.  This chart was dictated using voice recognition software.  Despite best efforts to proofread,  errors can occur which can change the documentation meaning.    Final Clinical Impressions(s) / ED Diagnoses   Final diagnoses:  Hyperglycemia    ED Discharge Orders         Ordered    ARIPiprazole (ABILIFY) 5 MG tablet  Daily     04/01/19 0844    gabapentin (NEURONTIN) 300 MG capsule  3 times daily     04/01/19 0844    lisinopril (ZESTRIL) 20 MG tablet  Daily     04/01/19 0844    metFORMIN (GLUCOPHAGE) 500 MG tablet  2 times daily with meals     04/01/19 0844    sertraline (ZOLOFT) 25 MG tablet  Daily     04/01/19 0844            Virgina Norfolk, DO 04/01/19 [phone]

## 2019-04-01 NOTE — ED Notes (Signed)
Pt verbalized understanding of discharge instructions. Pt getting dressed.  

## 2019-04-01 NOTE — ED Triage Notes (Signed)
Pt to ED via [facility] for SOB since smoking crack last night.  Also c/o CBG 420.  Pt concerned that she may have COVID since hugging someone with it last week.  NAD.

## 2019-04-02 ENCOUNTER — Emergency Department (HOSPITAL_COMMUNITY)
Admission: EM | Admit: 2019-04-02 | Discharge: 2019-04-02 | Discharge status: Against Medical Advice | Payer: Medicaid Other | Attending: Emergency Medicine | Admitting: Emergency Medicine

## 2019-04-02 DIAGNOSIS — R739 Hyperglycemia, unspecified: Secondary | ICD-10-CM | POA: Diagnosis present

## 2019-04-02 DIAGNOSIS — Z5321 Procedure and treatment not carried out due to patient leaving prior to being seen by health care provider: Secondary | ICD-10-CM | POA: Insufficient documentation

## 2019-04-02 NOTE — ED Notes (Signed)
Called pt to check if she is here no answer. I don't see pt either

## 2019-04-02 NOTE — ED Triage Notes (Addendum)
Pt here via EMS for hyperglycemia, 480 with hx of diabetes. Pt uncooperative with EMS, refusing to answer questions until she gets to the hospital. Pt walked out of triage room before vitals saying she had to go to the bathroom and then walked out the exit.

## 2019-04-02 NOTE — ED Notes (Signed)
I saw Pt walked out of ED and up to bus stop. Pt would not respond back to me when I called her. I dont know if pt is returning

## 2019-04-08 ENCOUNTER — Other Ambulatory Visit: Payer: Self-pay

## 2019-04-08 ENCOUNTER — Emergency Department (HOSPITAL_COMMUNITY)
Admission: EM | Admit: 2019-04-08 | Discharge: 2019-04-08 | Disposition: A | Discharge status: Other Acute Inpatient Hospital | Payer: Medicaid Other | Attending: Emergency Medicine | Admitting: Emergency Medicine

## 2019-04-08 ENCOUNTER — Encounter (HOSPITAL_COMMUNITY): Payer: Self-pay | Admitting: Family Medicine

## 2019-04-08 DIAGNOSIS — F17200 Nicotine dependence, unspecified, uncomplicated: Secondary | ICD-10-CM | POA: Insufficient documentation

## 2019-04-08 DIAGNOSIS — F1410 Cocaine abuse, uncomplicated: Secondary | ICD-10-CM | POA: Diagnosis not present

## 2019-04-08 DIAGNOSIS — Z91040 Latex allergy status: Secondary | ICD-10-CM | POA: Insufficient documentation

## 2019-04-08 DIAGNOSIS — R45851 Suicidal ideations: Secondary | ICD-10-CM

## 2019-04-08 DIAGNOSIS — I10 Essential (primary) hypertension: Secondary | ICD-10-CM | POA: Insufficient documentation

## 2019-04-08 DIAGNOSIS — R45850 Homicidal ideations: Secondary | ICD-10-CM | POA: Diagnosis not present

## 2019-04-08 DIAGNOSIS — E1165 Type 2 diabetes mellitus with hyperglycemia: Secondary | ICD-10-CM | POA: Diagnosis not present

## 2019-04-08 DIAGNOSIS — Z046 Encounter for general psychiatric examination, requested by authority: Secondary | ICD-10-CM | POA: Diagnosis not present

## 2019-04-08 DIAGNOSIS — R739 Hyperglycemia, unspecified: Secondary | ICD-10-CM | POA: Diagnosis present

## 2019-04-08 LAB — CBG MONITORING, ED
Glucose-Capillary: 499 mg/dL — ABNORMAL HIGH (ref 70–99)
Glucose-Capillary: 413 mg/dL — ABNORMAL HIGH (ref 70–99)
Glucose-Capillary: 201 mg/dL — ABNORMAL HIGH (ref 70–99)

## 2019-04-08 LAB — CBC WITH DIFFERENTIAL/PLATELET
Abs Immature Granulocytes: 0 10*3/uL (ref 0.00–0.07)
Basophils Absolute: 0 10*3/uL (ref 0.0–0.1)
Basophils Relative: 1 %
Eosinophils Absolute: 0.1 10*3/uL (ref 0.0–0.5)
Eosinophils Relative: 2 %
HCT: 37.6 % (ref 36.0–46.0)
Hemoglobin: 12.1 g/dL (ref 12.0–15.0)
Immature Granulocytes: 0 %
Lymphocytes Relative: 54 %
Lymphs Abs: 2.4 10*3/uL (ref 0.7–4.0)
MCH: 27.9 pg (ref 26.0–34.0)
MCHC: 32.2 g/dL (ref 30.0–36.0)
MCV: 86.6 fL (ref 80.0–100.0)
Monocytes Absolute: 0.3 10*3/uL (ref 0.1–1.0)
Monocytes Relative: 8 %
Neutro Abs: 1.5 10*3/uL — ABNORMAL LOW (ref 1.7–7.7)
Neutrophils Relative %: 35 %
Platelets: 297 10*3/uL (ref 150–400)
RBC: 4.34 MIL/uL (ref 3.87–5.11)
RDW: 12.7 % (ref 11.5–15.5)
WBC: 4.3 10*3/uL (ref 4.0–10.5)
nRBC: 0 % (ref 0.0–0.2)

## 2019-04-08 LAB — BASIC METABOLIC PANEL
Anion gap: 10 (ref 5–15)
BUN: 19 mg/dL (ref 6–20)
CO2: 26 mmol/L (ref 22–32)
Calcium: 9 mg/dL (ref 8.9–10.3)
Chloride: 98 mmol/L (ref 98–111)
Creatinine, Ser: 0.9 mg/dL (ref 0.44–1.00)
GFR calc Af Amer: >60 mL/min (ref >60)
GFR calc non Af Amer: >60 mL/min (ref >60)
Glucose, Bld: 483 mg/dL — ABNORMAL HIGH (ref 70–99)
Potassium: 4.6 mmol/L (ref 3.5–5.1)
Sodium: 134 mmol/L — ABNORMAL LOW (ref 135–145)

## 2019-04-08 LAB — RAPID URINE DRUG SCREEN, HOSP PERFORMED
Amphetamines: NOT DETECTED
Barbiturates: NOT DETECTED
Benzodiazepines: NOT DETECTED
Cocaine: POSITIVE — AB
Opiates: NOT DETECTED
Tetrahydrocannabinol: NOT DETECTED

## 2019-04-08 LAB — ETHANOL: Alcohol, Ethyl (B): <10 mg/dL (ref <10)

## 2019-04-08 MED ORDER — INSULIN ASPART 100 UNIT/ML ~~LOC~~ SOLN
5.0000 [IU] | Freq: Once | SUBCUTANEOUS | Status: AC
  Administered 2019-04-08: 5 [IU] via INTRAVENOUS
  Filled 2019-04-08: qty 0.05

## 2019-04-08 MED ORDER — SODIUM CHLORIDE 0.9 % IV BOLUS
1000.0000 mL | Freq: Once | INTRAVENOUS | Status: AC
  Administered 2019-04-08: 18:00:00 1000 mL via INTRAVENOUS
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
## 2019-04-08 NOTE — ED Triage Notes (Signed)
While triaging patient, she confirmed she was sucidial with a pan to shoot 5-15 people and then shot herself. Her plans would be to get a gun from someone off the street. Also, she mentions she would go the facility her mother is at in [facility] and kill her. She reports she is aware of which facility it is. Furthermore, states she has had constant SI since she was a little girl.

## 2019-04-08 NOTE — ED Notes (Signed)
Patient given a dinner tray at this time.

## 2019-04-08 NOTE — ED Triage Notes (Signed)
Patient is from [facility] transported via [employer]. According to IVC papers, she has history of cocaine use, homelessness, schizoaffective disorder, bipolar type PTSD. Today, she disclosed to staff that she was having active suicidal thoughts, been trying to buy a gun off the streets, and wants to go to mall to shoot 15 people and shoot herself with the 16th bullett. Also, she wants to slice her mother's throat who lives at a nursing home in [place], [place].

## 2019-04-08 NOTE — ED Notes (Signed)
[facility] here to transport pt back to [facility]. Pt given belongings and advised to change into clothes that were with pt on presentation.

## 2019-04-08 NOTE — ED Notes (Signed)
[facility] notified of need for transport of pt back to [facility].

## 2019-04-08 NOTE — ED Notes (Addendum)
Pt is being very rude and raising her voice to staff. Pt yelling "nurse" very loudly from the room and making statements such as "i'm a big fucking bitch, I need a lot of food." Pt requested an extra dinner tray. An extra dinner tray was provided to the pt. the pt proceeded to grab the tray from NT in a aggressive manner. Pt sitting at bedside and eating at this time.

## 2019-04-09 ENCOUNTER — Emergency Department (HOSPITAL_COMMUNITY)
Admission: EM | Admit: 2019-04-09 | Discharge: 2019-04-09 | Disposition: A | Discharge status: Home / Self Care | Payer: Medicaid Other | Attending: Emergency Medicine | Admitting: Emergency Medicine

## 2019-04-09 ENCOUNTER — Encounter (HOSPITAL_COMMUNITY): Payer: Self-pay | Admitting: Emergency Medicine

## 2019-04-09 DIAGNOSIS — Z5900 Homelessness unspecified: Secondary | ICD-10-CM

## 2019-04-09 DIAGNOSIS — I10 Essential (primary) hypertension: Secondary | ICD-10-CM | POA: Insufficient documentation

## 2019-04-09 DIAGNOSIS — F259 Schizoaffective disorder, unspecified: Secondary | ICD-10-CM | POA: Diagnosis present

## 2019-04-09 DIAGNOSIS — F250 Schizoaffective disorder, bipolar type: Secondary | ICD-10-CM

## 2019-04-09 DIAGNOSIS — F1420 Cocaine dependence, uncomplicated: Secondary | ICD-10-CM | POA: Diagnosis present

## 2019-04-09 DIAGNOSIS — R45851 Suicidal ideations: Secondary | ICD-10-CM | POA: Diagnosis not present

## 2019-04-09 DIAGNOSIS — F17210 Nicotine dependence, cigarettes, uncomplicated: Secondary | ICD-10-CM | POA: Diagnosis not present

## 2019-04-09 DIAGNOSIS — Z7984 Long term (current) use of oral hypoglycemic drugs: Secondary | ICD-10-CM | POA: Insufficient documentation

## 2019-04-09 DIAGNOSIS — Z91040 Latex allergy status: Secondary | ICD-10-CM | POA: Insufficient documentation

## 2019-04-09 DIAGNOSIS — Z79899 Other long term (current) drug therapy: Secondary | ICD-10-CM | POA: Diagnosis not present

## 2019-04-09 DIAGNOSIS — F1914 Other psychoactive substance abuse with psychoactive substance-induced mood disorder: Secondary | ICD-10-CM | POA: Diagnosis not present

## 2019-04-09 DIAGNOSIS — E119 Type 2 diabetes mellitus without complications: Secondary | ICD-10-CM | POA: Insufficient documentation

## 2019-04-09 DIAGNOSIS — Z8659 Personal history of other mental and behavioral disorders: Secondary | ICD-10-CM

## 2019-04-09 DIAGNOSIS — F1994 Other psychoactive substance use, unspecified with psychoactive substance-induced mood disorder: Secondary | ICD-10-CM

## 2019-04-09 LAB — CBG MONITORING, ED
Glucose-Capillary: 391 mg/dL — ABNORMAL HIGH (ref 70–99)
Glucose-Capillary: 304 mg/dL — ABNORMAL HIGH (ref 70–99)

## 2019-04-09 MED ORDER — INSULIN ASPART 100 UNIT/ML ~~LOC~~ SOLN
12.0000 [IU] | Freq: Once | SUBCUTANEOUS | Status: AC
  Administered 2019-04-09: 12 [IU] via SUBCUTANEOUS
  Filled 2019-04-09: qty 0.12

## 2019-04-09 MED ORDER — ZIPRASIDONE MESYLATE 20 MG IM SOLR
20.0000 mg | Freq: Once | INTRAMUSCULAR | Status: AC
  Administered 2019-04-09: 07:00:00 20 mg via INTRAMUSCULAR
  Filled 2019-04-09: qty 20

## 2019-04-09 MED ORDER — LISINOPRIL 20 MG PO TABS
20.0000 mg | ORAL_TABLET | Freq: Every day | ORAL | Status: DC
  Administered 2019-04-09: 12:00:00 20 mg via ORAL
  Filled 2019-04-09: qty 1

## 2019-04-09 MED ORDER — METFORMIN HCL 500 MG PO TABS: 500.0000 mg | ORAL_TABLET | Freq: Two times a day (BID) | ORAL | Status: DC

## 2019-04-09 MED ORDER — STERILE WATER FOR INJECTION IJ SOLN
INTRAMUSCULAR | Status: AC
  Administered 2019-04-09: 1.2 mL
  Filled 2019-04-09: qty 10

## 2019-04-09 MED ORDER — ARIPIPRAZOLE 5 MG PO TABS
5.0000 mg | ORAL_TABLET | Freq: Every day | ORAL | Status: DC
  Administered 2019-04-09: 5 mg via ORAL
  Filled 2019-04-09: qty 1

## 2019-04-09 MED ORDER — LORAZEPAM 1 MG PO TABS
1.0000 mg | ORAL_TABLET | Freq: Once | ORAL | Status: AC
  Administered 2019-04-09: 09:00:00 1 mg via ORAL
  Filled 2019-04-09: qty 1

## 2019-04-09 MED ORDER — SERTRALINE HCL 50 MG PO TABS
25.0000 mg | ORAL_TABLET | Freq: Every day | ORAL | Status: DC
  Administered 2019-04-09: 12:00:00 25 mg via ORAL
  Filled 2019-04-09: qty 1

## 2019-04-09 NOTE — ED Notes (Signed)
Brought back at shift change to room 28 from triage. Initially pleasant, is naked and was naked when [facility] arrived to [facility] to pick her up. She states she wont wear paper scrubs. She is currently draped in a blanket. She asked for something for the pain she is having of a burning nature in hr vagina. Told we would address that with the ED Dr but I had nothing available at this time. She accepted that and returned to her room.

## 2019-04-09 NOTE — BH Assessment (Signed)
[facility] Assessment Progress Note  Per Hampton Abbot, MD, this pt does not require psychiatric hospitalization at this time.  Pt presents under IVC initiated by [facility] staff, which Dr Dwyane Dee has rescinded.  Pt is to be discharged from [facility] with recommendation to follow up with [facility].  This has been included in pt's discharge instructions.  Shuvon Rankin, FNP reports that she will be ordering a social work consult as well.  Pt's nurse, Diane, has been notified.  Jalene Mullet, [place] Triage Specialist [phone]

## 2019-04-09 NOTE — ED Notes (Signed)
Informed Dr. Ashok Cordia of CGB = 309 prior to lunch by [employer].

## 2019-04-09 NOTE — [facility] Suicide Risk Assessment (Cosign Needed)
Suicide Risk Assessment  Discharge Assessment   [facility] Discharge Suicide Risk Assessment   Principal Problem: Substance induced mood disorder (HCC) Discharge Diagnoses: Principal Problem:   Substance induced mood disorder (HCC) Active Problems:   Cocaine dependence (HCC)   Homelessness   History of posttraumatic stress disorder (PTSD)   Total Time spent with patient: 30 minutes  Musculoskeletal: Strength & Muscle Tone: within normal limits Gait & Station: normal Patient leans: N/A  Psychiatric Specialty Exam:   Blood pressure (!) 168/121, pulse (!) 110, temperature 98.6 F (37 C), temperature source Oral, resp. rate 18, SpO2 100 %.There is no height or weight on file to calculate BMI.  General Appearance: Casual  Eye Contact::  Good  Speech:  Clear and Coherent and Normal Rate409  Volume:  Increased  Mood:  Angry and Irritable  Affect:  Congruent  Thought Process:  Coherent and Descriptions of Associations: Intact  Orientation:  Full (Time, Place, and Person)  Thought Content:  WDL  Suicidal Thoughts:  No  Homicidal Thoughts:  No  Memory:  Immediate;   Good Recent;   Good  Judgement:  Intact  Insight:  Present  Psychomotor Activity:  Normal  Concentration:  Good  Recall:  Good  Fund of Knowledge:Fair  Language: Good  Akathisia:  No  Handed:  Right  AIMS (if indicated):     Assets:  Communication Skills Desire for Improvement  Sleep:     Cognition: WNL  ADL's:  Intact   Mental Status Per Nursing Assessment::   On Admission:    Katherine Franklin, 53 y.o., female patient seen via tele psych by this provider, Dr. Lucianne Muss; and chart reviewed on 04/09/19.  On evaluation Katherine Franklin asked what happened to get her in the hospital patient responded "Don't worry about would get me in the hospital what is your damn plan that is my question."  Explained to patient that I would need to know what was happening so that I could get her the care she needed and that was the reason for  the questions; patient responded "I done answered these damn questions before why don't you talk to the damn doctor that I talked to before you.  I need a damn place to stay, food to eat, my medicaid back in place; what can you do about that."  Informed patient that would have social worker to speak with her "I don't want to talk to no damn social worker they can't help me do shit."  Patient asked if she was having thoughts of killing herself "Hell naw I don't want to kill myself and I don't hear or see a damn thing either.  They tell you to tell lies to get in the hospital; that you want to kill yourself; so I say any damn thing I can to get in the hospital.  I'll tell them I'm seeing a damn clown and hear him laughing at me if I need to; you ain't gonna do a damn thing for me anyway.  Patient only requesting housing, food, and assistance to get her medicaid back.   During evaluation Katherine Franklin is alert/oriented x 4; patient argumentative, and irritable She does not appear to be responding to internal/external stimuli or delusional thoughts.  Patient denies suicidal/self-harm/homicidal ideation, psychosis, and paranoia.  Patient answered question appropriately.     Demographic Factors:  Low socioeconomic status  Loss Factors: Financial problems/change in socioeconomic status  Historical Factors: NA  Risk Reduction Factors:   Religious beliefs about death  Continued Clinical Symptoms:  Alcohol/Substance Abuse/Dependencies Previous Psychiatric Diagnoses and Treatments  Cognitive Features That Contribute To Risk:  None    Suicide Risk:  Minimal: No identifiable suicidal ideation.  Patients presenting with no risk factors but with morbid ruminations; may be classified as minimal risk based on the severity of the depressive symptoms    Plan Of Care/Follow-up recommendations:  Activity:  As tolerated Diet:  Heart healthy Other:  Follow up with resources given   Disposition:  Patient  psychiatrically cleared No evidence of imminent risk to self or others at present.   Patient does not meet criteria for psychiatric inpatient admission. Supportive therapy provided about ongoing stressors. Discussed crisis plan, support from social network, calling 911, coming to the Emergency Department, and calling Suicide Hotline.   , NP 04/09/2019, 10:21 AM

## 2019-04-09 NOTE — Discharge Instructions (Signed)
For your behavioral health needs you are advised to follow up with [facility].  New patients are seen at their walk-in clinic.  Walk-in hours are Monday - Friday from 8:30 am - 12:00 pm, and from 1:00 pm - 2:30 pm.  Walk-in patients are seen on a first come, first served basis, so try to arrive as early as possible for the best chance of being seen the same day:       [facility]      [address]      [place],  [address]      [phone] 

## 2019-04-09 NOTE — ED Triage Notes (Signed)
Patient brought in by [employer]. Patient seen at [facility] today. Patient has a hx of cocaine use disorder, homelessness, schizoaffective disorder bipolar type, and PTSD. Today she disclosed to multiple staff that she is having active suicidal thoughts, has been trying to buy a gun off the the streets and wants to go to the mall to shoot 15 people and shoot herself with 16th bullet. She also wants to slice her mother's throat who lives at a nursing home in [place], [place]. She is a danger to self/others at this time. Patient comes in with cussing and fussing. Patient is very rude and aggressive.

## 2019-04-09 NOTE — ED Provider Notes (Signed)
[facility]-EMERGENCY DEPT Provider Note   CSN: [id number] Arrival date & time: 04/09/19  0631     History   Chief Complaint Chief Complaint  Patient presents with  . ivc  . Medical Clearance    HPI Katherine Franklin is a 53 y.o. female.     Patient with hx schizoaffective disorder, arrives via [employer] [facility]. Per report, pt having thoughts of harm to self and others. In ED, pt is highly uncooperative - level 5 caveat - just states she wanted to 'get away [facility]', and that she would like 'breakfast, or atleast graham crackers, peanut butter, and Sierra Mist'. Pt denies any specific thoughts of harm to self or others.   The history is provided by the patient and the police. The history is limited by the condition of the patient.    Past Medical History:  Diagnosis Date  . Diabetes mellitus without complication (HCC)   . Drug abuse (HCC) 09/22/2018  . GERD (gastroesophageal reflux disease)   . Hypertension   . PTSD (post-traumatic stress disorder)   . Schizo affective schizophrenia [facility](HCC)     Patient Active Problem List   Diagnosis Date Noted  . Cocaine dependence (HCC) 08/18/2018  . Major depressive disorder, recurrent severe without psychotic features (HCC) 08/18/2018    Past Surgical History:  Procedure Laterality Date  . SKIN GRAFT       OB History   No obstetric history on file.      Home Medications    Prior to Admission medications   Medication Sig Start Date End Date Taking? Authorizing Provider  ARIPiprazole (ABILIFY) 5 MG tablet Take 1 tablet (5 mg total) by mouth daily. Patient not taking: Reported on 04/08/2019 04/01/19   Virgina Norfolkuratolo, Adam, DO  ciprofloxacin (CIPRO) 500 MG tablet Take 1 tablet (500 mg total) by mouth 2 (two) times daily. Patient not taking: Reported on 04/08/2019 09/01/18   Garlon HatchetSanders, Lisa M, PA-C  gabapentin (NEURONTIN) 300 MG capsule Take 1 capsule (300 mg total) by mouth 3 (three)  times daily. Patient not taking: Reported on 04/08/2019 04/01/19   Virgina Norfolkuratolo, Adam, DO  lisinopril (ZESTRIL) 20 MG tablet Take 1 tablet (20 mg total) by mouth daily. Patient not taking: Reported on 04/08/2019 04/01/19   Virgina Norfolkuratolo, Adam, DO  metFORMIN (GLUCOPHAGE) 500 MG tablet Take 1 tablet (500 mg total) by mouth 2 (two) times daily with a meal. Patient not taking: Reported on 04/08/2019 04/01/19   Virgina Norfolkuratolo, Adam, DO  sertraline (ZOLOFT) 25 MG tablet Take 1 tablet (25 mg total) by mouth daily. Patient not taking: Reported on 04/08/2019 04/01/19   Virgina Norfolkuratolo, Adam, DO    Family History Family History  Family history unknown: Yes    Social History Social History   Tobacco Use  . Smoking status: Current Every Day Smoker    Packs/day: 2.00  . Smokeless tobacco: Never Used  Substance Use Topics  . Alcohol use: Not Currently    Frequency: Never  . Drug use: Yes    Frequency: 7.0 times per week    Types: Cocaine, "Crack" cocaine    Comment: Last used: Early this AM      Allergies   Sulfa antibiotics and Latex   Review of Systems Review of Systems  Unable to perform ROS: Psychiatric disorder  patient uncooperative - level 5 caveat     Physical Exam Updated Vital Signs BP (!) 168/121 (BP Location: Left Arm)   Pulse (!) 110  Temp 98.6 F (37 C) (Oral)   Resp 18   SpO2 100%   Physical Exam Vitals signs and nursing note reviewed.  Constitutional:      Appearance: Normal appearance. She is well-developed.  HENT:     Head: Atraumatic.     Nose: Nose normal.     Mouth/Throat:     Mouth: Mucous membranes are moist.  Eyes:     General: No scleral icterus.    Conjunctiva/sclera: Conjunctivae normal.     Pupils: Pupils are equal, round, and reactive to light.  Neck:     Musculoskeletal: Normal range of motion and neck supple. No neck rigidity or muscular tenderness.     Trachea: No tracheal deviation.  Cardiovascular:     Rate and Rhythm: Normal rate and regular  rhythm.     Pulses: Normal pulses.     Heart sounds: Normal heart sounds. No murmur. No friction rub. No gallop.   Pulmonary:     Effort: Pulmonary effort is normal. No respiratory distress.     Breath sounds: Normal breath sounds.  Abdominal:     General: Bowel sounds are normal. There is no distension.     Palpations: Abdomen is soft.     Tenderness: There is no abdominal tenderness. There is no guarding.  Genitourinary:    Comments: No cva tenderness.  Musculoskeletal:        General: No swelling.  Skin:    General: Skin is warm and dry.     Findings: No rash.  Neurological:     Mental Status: She is alert.     Comments: Alert, speech normal. Steady gait.   Psychiatric:     Comments: Pt uncooperative w history, repetitively asking for food/drink. Denies SI/HI.      ED Treatments / Results  Labs (all labs ordered are listed, but only abnormal results are displayed) Results for orders placed or performed during the hospital encounter of 04/09/19  CBG monitoring, ED  Result Value Ref Range   Glucose-Capillary 391 (H) 70 - 99 mg/dL   Comment 1 Notify RN    Comment 2 Document in Chart    Dg Chest 2 View  Result Date: 04/01/2019 CLINICAL DATA:  Shortness of breath. EXAM: CHEST - 2 VIEW COMPARISON:  Sep 29, 2018. FINDINGS: The heart size and mediastinal contours are within normal limits. Both lungs are clear. No pneumothorax or pleural effusion is noted. The visualized skeletal structures are unremarkable. IMPRESSION: No active cardiopulmonary disease. Electronically Signed   By: Marijo Conception M.D.   On: 04/01/2019 08:07    EKG None  Radiology No results found.  Procedures Procedures (including critical care time)  Medications Ordered in ED Medications  ziprasidone (GEODON) injection 20 mg (20 mg Intramuscular Given 04/09/19 0643)  sterile water (preservative free) injection (1.2 mLs  Given 04/09/19 9935)     Initial Impression / Assessment and Plan / ED Course   I have reviewed the triage vital signs and the nursing notes.  Pertinent labs & imaging results that were available during my care of the patient were reviewed by me and considered in my medical decision making (see chart for details).  Labs sent.   It appears patient was at [facility] yesterday for same, sent to ED then for medical clearance - glucose elevated then, was improved, and returned to [facility] - unclear why Beverly Sessions has returned patient to ED again.   Reviewed nursing notes and prior charts for additional history.   Will get  BH re-evaluation as pt states feels improved.  TTS consult is pending.   Labs reviewed/interpreted by me - glucose high 391. Pt refuses other labs in ED. Patient refuses iv/iv fluids. Yesterdays labs reviewed - hco3 normal. Po fluids/water. novolog sq.   Patient continues to deny thoughts of harm to self or others. States feels improved. Tolerating po. Denies pain, no headache, no cp. Denies sob.   BH evaluation pending.  Disposition per [facility] team.     Final Clinical Impressions(s) / ED Diagnoses   Final diagnoses:  None    ED Discharge Orders    None       Cathren Laine, MD 04/09/19 1131

## 2019-04-09 NOTE — ED Notes (Signed)
Pt discharged home. Verbalized readiness to leave and got dressed. Discharged instructions read to pt who verbalized understanding. All belongings returned to pt. Denies SI/HI, is not delusional and not responding to internal stimuli. Escorted pt to the ED exit.

## 2019-04-09 NOTE — ED Notes (Signed)
Refused blood draw at this time. "I just want to go home. I want to get the hell out of here".

## 2019-04-09 NOTE — Progress Notes (Signed)
CSW spoke with patient at bedside. Patient reports she has been living in a mattress in the field and has been homeless since the age of 38. CSW attempted to find shelter placement for patient, but did not receive an answer or get a call back. Patient then came out of her room and stated "they told me I am being discharged and I want to go. I am going back to my mattress in the field. That is my home that God gave me." CSW attempted to provide patient with homeless resources, but patient declined to take them stating "all it is going to do is get wet". Patient reports she is familiar with the [facility] and will go there for assistance.  [place], LCSW Transitions of Care Department [facility] ED [phone]

## 2019-04-09 NOTE — Progress Notes (Signed)
   04/09/19 0800  General Assessment Data  Reason for not completing assessment [facility] attempted to complete assessment.  NT Joe took the telepysch machine in pt's room; but was unable to arouse pt.  TTS will attempt to assess pt at a later time    Kamilia Carollo L. Teller, [place], [facility], [facility] Therapeutic Triage Specialist  [phone]

## 2019-04-10 ENCOUNTER — Emergency Department (HOSPITAL_COMMUNITY)
Admission: EM | Admit: 2019-04-10 | Discharge: 2019-04-10 | Disposition: A | Discharge status: Home / Self Care | Payer: Medicaid Other | Attending: Emergency Medicine | Admitting: Emergency Medicine

## 2019-04-10 NOTE — ED Notes (Signed)
Prior to getting vitals or ekg pt refuses to stay in ER after using explicit words got up from wheelchair and walked out of ED. Pt will be discharged from system.

## 2019-04-10 NOTE — ED Triage Notes (Signed)
Pt arrives via [facility] after using "crack cocaine" and then feeling like her blood pressure went high and began to have chest pain. On arrival to triage room pt is saying inappropriate things and also refuses to let this RN get an EKG. Ems was able to get an EKG without abnormal findings. Pt did take 324mg  asa with ems.

## 2019-04-14 ENCOUNTER — Emergency Department (HOSPITAL_COMMUNITY)
Admission: EM | Admit: 2019-04-14 | Discharge: 2019-04-14 | Disposition: A | Discharge status: Home / Self Care | Payer: Medicaid Other | Attending: Emergency Medicine | Admitting: Emergency Medicine

## 2019-04-14 ENCOUNTER — Encounter (HOSPITAL_COMMUNITY): Payer: Self-pay | Admitting: Emergency Medicine

## 2019-04-14 DIAGNOSIS — E119 Type 2 diabetes mellitus without complications: Secondary | ICD-10-CM | POA: Insufficient documentation

## 2019-04-14 DIAGNOSIS — L03115 Cellulitis of right lower limb: Secondary | ICD-10-CM | POA: Diagnosis not present

## 2019-04-14 DIAGNOSIS — F17200 Nicotine dependence, unspecified, uncomplicated: Secondary | ICD-10-CM | POA: Diagnosis not present

## 2019-04-14 DIAGNOSIS — I10 Essential (primary) hypertension: Secondary | ICD-10-CM | POA: Insufficient documentation

## 2019-04-14 DIAGNOSIS — M79671 Pain in right foot: Secondary | ICD-10-CM | POA: Diagnosis present

## 2019-04-14 MED ORDER — DOXYCYCLINE HYCLATE 100 MG PO CAPS: 100.0000 mg | ORAL_CAPSULE | Freq: Two times a day (BID) | ORAL | 0 refills | Status: DC

## 2019-04-14 MED ORDER — DOXYCYCLINE HYCLATE 100 MG PO TABS
100.0000 mg | ORAL_TABLET | Freq: Once | ORAL | Status: AC
  Administered 2019-04-14: 13:00:00 100 mg via ORAL
  Filled 2019-04-14: qty 1

## 2019-04-14 NOTE — ED Provider Notes (Signed)
[facility] EMERGENCY DEPARTMENT Provider Note   CSN: [id number] Arrival date & time: 04/14/19  1139     History   Chief Complaint No chief complaint on file.   HPI Katherine Franklin is a 53 y.o. female.     Patient is a 53 year old female with past medical history of diabetes, hypertension, schizoaffective disorder, and homelessness.  Patient presents here with complaints of feeling chilled and right foot pain.  She states that she has been sleeping in the cold and rain for the past 2 nights and believes she is hypothermic.  She also struck her foot on an object yesterday and is now having redness and pain.  She is concerned about infection as she is diabetic.  She denies any fevers or chills.  The history is provided by the patient.    Past Medical History:  Diagnosis Date  . Diabetes mellitus without complication ([place])   . Drug abuse ([place]) 09/22/2018  . GERD (gastroesophageal reflux disease)   . Hypertension   . PTSD (post-traumatic stress disorder)   . Schizo affective schizophrenia [facility])     Patient Active Problem List   Diagnosis Date Noted  . Substance induced mood disorder ([place]) 04/09/2019  . Homelessness 04/09/2019  . History of posttraumatic stress disorder (PTSD) 04/09/2019  . Cocaine dependence ([place]) 08/18/2018  . Major depressive disorder, recurrent severe without psychotic features ([place]) 08/18/2018    Past Surgical History:  Procedure Laterality Date  . SKIN GRAFT       OB History   No obstetric history on file.      Home Medications    Prior to Admission medications   Medication Sig Start Date End Date Taking? Authorizing Provider  ARIPiprazole (ABILIFY) 5 MG tablet Take 1 tablet (5 mg total) by mouth daily. Patient not taking: Reported on 04/08/2019 04/01/19   Lennice Sites, DO  lisinopril (ZESTRIL) 20 MG tablet Take 1 tablet (20 mg total) by mouth daily. Patient not taking: Reported on 04/08/2019 04/01/19   Lennice Sites, DO   metFORMIN (GLUCOPHAGE) 500 MG tablet Take 1 tablet (500 mg total) by mouth 2 (two) times daily with a meal. Patient not taking: Reported on 04/08/2019 04/01/19   Lennice Sites, DO  sertraline (ZOLOFT) 25 MG tablet Take 1 tablet (25 mg total) by mouth daily. Patient not taking: Reported on 04/08/2019 04/01/19   Lennice Sites, DO    Family History Family History  Family history unknown: Yes    Social History Social History   Tobacco Use  . Smoking status: Current Every Day Smoker    Packs/day: 2.00  . Smokeless tobacco: Never Used  Substance Use Topics  . Alcohol use: Not Currently    Frequency: Never  . Drug use: Yes    Frequency: 7.0 times per week    Types: Cocaine, "Crack" cocaine    Comment: Last used: Early this AM      Allergies   Sulfa antibiotics and Latex   Review of Systems Review of Systems  All other systems reviewed and are negative.    Physical Exam Updated Vital Signs BP (!) 130/100   Pulse 78   Temp 98 F (36.7 C) (Oral)   Resp 18   SpO2 97%   Physical Exam Vitals signs and nursing note reviewed.  Constitutional:      General: She is not in acute distress.    Appearance: She is well-developed. She is not diaphoretic.  HENT:     Head: Normocephalic and atraumatic.  Neck:     Musculoskeletal: Normal range of motion and neck supple.  Cardiovascular:     Rate and Rhythm: Normal rate and regular rhythm.     Heart sounds: No murmur. No friction rub. No gallop.   Pulmonary:     Effort: Pulmonary effort is normal. No respiratory distress.     Breath sounds: Normal breath sounds. No wheezing.  Abdominal:     General: Bowel sounds are normal. There is no distension.     Palpations: Abdomen is soft.     Tenderness: There is no abdominal tenderness.  Musculoskeletal: Normal range of motion.     Comments: There is a small area of erythema and warmth to the lateral aspect of the dorsum of the right foot.  Skin:    General: Skin is warm and  dry.  Neurological:     Mental Status: She is alert and oriented to person, place, and time.  Psychiatric:        Attention and Perception: Attention normal.        Mood and Affect: Mood is elated.        Speech: Speech is rapid and pressured.      ED Treatments / Results  Labs (all labs ordered are listed, but only abnormal results are displayed) Labs Reviewed - No data to display  EKG None  Radiology No results found.  Procedures Procedures (including critical care time)  Medications Ordered in ED Medications  doxycycline (VIBRA-TABS) tablet 100 mg (100 mg Oral Given 04/14/19 1310)     Initial Impression / Assessment and Plan / ED Course  I have reviewed the triage vital signs and the nursing notes.  Pertinent labs & imaging results that were available during my care of the patient were reviewed by me and considered in my medical decision making (see chart for details).  Patient's body temperature and vital signs are normal.  She does have mild erythema and swelling to the lateral aspect of the right foot over the distal fifth metatarsal.  This could represent early cellulitis.  This will be treated with doxycycline and return as needed.  Final Clinical Impressions(s) / ED Diagnoses   Final diagnoses:  None    ED Discharge Orders    None       Geoffery Lyons, MD 04/14/19 1408

## 2019-04-14 NOTE — ED Triage Notes (Signed)
Per PTAR pt is homeless and called for possible hypothermia since shes been out in the cold. Patient also asking for help with a diabetic wound to her left foot and wants help with crack cocaine abuse.

## 2019-04-14 NOTE — Discharge Instructions (Addendum)
Begin taking doxycycline as prescribed.  Follow-up with primary doctor if not improving, and return to the ER if symptoms significantly worsen or change.

## 2019-04-18 ENCOUNTER — Inpatient Hospital Stay (HOSPITAL_COMMUNITY)
Admission: EM | Admit: 2019-04-18 | Discharge: 2019-04-19 | DRG: 603 | Discharge status: Against Medical Advice | Payer: Medicaid Other | Attending: Internal Medicine | Admitting: Internal Medicine

## 2019-04-18 ENCOUNTER — Other Ambulatory Visit: Payer: Self-pay

## 2019-04-18 ENCOUNTER — Emergency Department (HOSPITAL_COMMUNITY): Payer: Medicaid Other

## 2019-04-18 DIAGNOSIS — K219 Gastro-esophageal reflux disease without esophagitis: Secondary | ICD-10-CM | POA: Diagnosis present

## 2019-04-18 DIAGNOSIS — Z590 Homelessness: Secondary | ICD-10-CM

## 2019-04-18 DIAGNOSIS — F332 Major depressive disorder, recurrent severe without psychotic features: Secondary | ICD-10-CM | POA: Diagnosis present

## 2019-04-18 DIAGNOSIS — L03115 Cellulitis of right lower limb: Principal | ICD-10-CM | POA: Diagnosis present

## 2019-04-18 DIAGNOSIS — E1165 Type 2 diabetes mellitus with hyperglycemia: Secondary | ICD-10-CM | POA: Diagnosis present

## 2019-04-18 DIAGNOSIS — F4310 Post-traumatic stress disorder, unspecified: Secondary | ICD-10-CM | POA: Diagnosis present

## 2019-04-18 DIAGNOSIS — T148XXA Other injury of unspecified body region, initial encounter: Secondary | ICD-10-CM

## 2019-04-18 DIAGNOSIS — D649 Anemia, unspecified: Secondary | ICD-10-CM | POA: Diagnosis present

## 2019-04-18 DIAGNOSIS — Z882 Allergy status to sulfonamides status: Secondary | ICD-10-CM

## 2019-04-18 DIAGNOSIS — Z5329 Procedure and treatment not carried out because of patient's decision for other reasons: Secondary | ICD-10-CM | POA: Diagnosis present

## 2019-04-18 DIAGNOSIS — F1420 Cocaine dependence, uncomplicated: Secondary | ICD-10-CM | POA: Diagnosis present

## 2019-04-18 DIAGNOSIS — E669 Obesity, unspecified: Secondary | ICD-10-CM | POA: Diagnosis present

## 2019-04-18 DIAGNOSIS — E871 Hypo-osmolality and hyponatremia: Secondary | ICD-10-CM | POA: Diagnosis present

## 2019-04-18 DIAGNOSIS — S91301A Unspecified open wound, right foot, initial encounter: Secondary | ICD-10-CM | POA: Diagnosis present

## 2019-04-18 DIAGNOSIS — X58XXXA Exposure to other specified factors, initial encounter: Secondary | ICD-10-CM | POA: Diagnosis present

## 2019-04-18 DIAGNOSIS — Z91040 Latex allergy status: Secondary | ICD-10-CM

## 2019-04-18 DIAGNOSIS — E878 Other disorders of electrolyte and fluid balance, not elsewhere classified: Secondary | ICD-10-CM | POA: Diagnosis present

## 2019-04-18 DIAGNOSIS — L0390 Cellulitis, unspecified: Secondary | ICD-10-CM | POA: Diagnosis present

## 2019-04-18 DIAGNOSIS — I10 Essential (primary) hypertension: Secondary | ICD-10-CM | POA: Diagnosis present

## 2019-04-18 DIAGNOSIS — F259 Schizoaffective disorder, unspecified: Secondary | ICD-10-CM | POA: Diagnosis present

## 2019-04-18 DIAGNOSIS — F17210 Nicotine dependence, cigarettes, uncomplicated: Secondary | ICD-10-CM | POA: Diagnosis present

## 2019-04-18 MED ORDER — Medication
1.00 | Status: DC
Start: 2019-04-17 — End: 2019-04-18

## 2019-04-18 MED ORDER — COMPOUND W FREEZE OFF EX AERO
1.00 | INHALATION_SPRAY | CUTANEOUS | Status: DC
Start: 2019-04-17 — End: 2019-04-18

## 2019-04-18 MED ORDER — Medication
Status: DC
Start: ? — End: 2019-04-18

## 2019-04-18 MED ORDER — CVS EAR DROPS OT
40.00 | OTIC | Status: DC
Start: 2019-04-17 — End: 2019-04-18

## 2019-04-18 MED ORDER — Medication
1.50 | Status: DC
Start: 2019-04-18 — End: 2019-04-18

## 2019-04-18 MED ORDER — QUINERVA 260 MG PO TABS
650.00 | ORAL_TABLET | ORAL | Status: DC
Start: ? — End: 2019-04-18

## 2019-04-18 MED ORDER — Medication
1.00 | Status: DC
Start: ? — End: 2019-04-18

## 2019-04-18 MED ORDER — SODIUM CHLORIDE 0.9 % IV SOLN
2.0000 g | Freq: Once | INTRAVENOUS | Status: AC
  Administered 2019-04-19: 2 g via INTRAVENOUS
  Filled 2019-04-18: qty 20

## 2019-04-18 MED ORDER — COMPOUND W FREEZE OFF EX AERO
1.00 | INHALATION_SPRAY | CUTANEOUS | Status: DC
Start: ? — End: 2019-04-18

## 2019-04-18 MED ORDER — PANATUSS 5-2-15-100 MG/5ML OR SYRP
20.00 | ORAL_SOLUTION | ORAL | Status: DC
Start: 2019-04-18 — End: 2019-04-18

## 2019-04-18 MED ORDER — Medication
5.00 | Status: DC
Start: ? — End: 2019-04-18

## 2019-04-18 MED ORDER — VANCOMYCIN HCL IN DEXTROSE 1-5 GM/200ML-% IV SOLN
1000.0000 mg | Freq: Once | INTRAVENOUS | Status: AC
  Administered 2019-04-19: 1000 mg via INTRAVENOUS
  Filled 2019-04-18: qty 200

## 2019-04-18 MED ORDER — BARO-CAT PO
10.00 | ORAL | Status: DC
Start: ? — End: 2019-04-18

## 2019-04-18 MED ORDER — Medication
2.00 | Status: DC
Start: 2019-04-18 — End: 2019-04-18

## 2019-04-18 MED ORDER — Medication
5.00 | Status: DC
Start: 2019-04-18 — End: 2019-04-18

## 2019-04-18 NOTE — ED Triage Notes (Signed)
Pt here for wound check of left foot. She is homeless and concerned that she will get gangrene because it's so cold outside.

## 2019-04-18 NOTE — ED Notes (Signed)
Pt transported to xray 

## 2019-04-19 ENCOUNTER — Emergency Department (HOSPITAL_COMMUNITY): Payer: Medicaid Other

## 2019-04-19 ENCOUNTER — Encounter (HOSPITAL_COMMUNITY): Payer: Self-pay | Admitting: Internal Medicine

## 2019-04-19 DIAGNOSIS — Z5329 Procedure and treatment not carried out because of patient's decision for other reasons: Secondary | ICD-10-CM | POA: Diagnosis present

## 2019-04-19 DIAGNOSIS — F1420 Cocaine dependence, uncomplicated: Secondary | ICD-10-CM | POA: Diagnosis present

## 2019-04-19 DIAGNOSIS — D649 Anemia, unspecified: Secondary | ICD-10-CM | POA: Diagnosis present

## 2019-04-19 DIAGNOSIS — L03115 Cellulitis of right lower limb: Principal | ICD-10-CM

## 2019-04-19 DIAGNOSIS — I10 Essential (primary) hypertension: Secondary | ICD-10-CM | POA: Diagnosis present

## 2019-04-19 DIAGNOSIS — F4310 Post-traumatic stress disorder, unspecified: Secondary | ICD-10-CM | POA: Diagnosis present

## 2019-04-19 DIAGNOSIS — Z882 Allergy status to sulfonamides status: Secondary | ICD-10-CM | POA: Diagnosis not present

## 2019-04-19 DIAGNOSIS — E1165 Type 2 diabetes mellitus with hyperglycemia: Secondary | ICD-10-CM | POA: Diagnosis present

## 2019-04-19 DIAGNOSIS — E669 Obesity, unspecified: Secondary | ICD-10-CM | POA: Diagnosis present

## 2019-04-19 DIAGNOSIS — E871 Hypo-osmolality and hyponatremia: Secondary | ICD-10-CM | POA: Diagnosis present

## 2019-04-19 DIAGNOSIS — X58XXXA Exposure to other specified factors, initial encounter: Secondary | ICD-10-CM | POA: Diagnosis present

## 2019-04-19 DIAGNOSIS — F259 Schizoaffective disorder, unspecified: Secondary | ICD-10-CM | POA: Diagnosis present

## 2019-04-19 DIAGNOSIS — Z91040 Latex allergy status: Secondary | ICD-10-CM | POA: Diagnosis not present

## 2019-04-19 DIAGNOSIS — F332 Major depressive disorder, recurrent severe without psychotic features: Secondary | ICD-10-CM | POA: Diagnosis present

## 2019-04-19 DIAGNOSIS — E878 Other disorders of electrolyte and fluid balance, not elsewhere classified: Secondary | ICD-10-CM | POA: Diagnosis present

## 2019-04-19 DIAGNOSIS — L0390 Cellulitis, unspecified: Secondary | ICD-10-CM | POA: Diagnosis present

## 2019-04-19 DIAGNOSIS — Z590 Homelessness: Secondary | ICD-10-CM | POA: Diagnosis not present

## 2019-04-19 DIAGNOSIS — F17210 Nicotine dependence, cigarettes, uncomplicated: Secondary | ICD-10-CM | POA: Diagnosis present

## 2019-04-19 DIAGNOSIS — S91301A Unspecified open wound, right foot, initial encounter: Secondary | ICD-10-CM | POA: Diagnosis present

## 2019-04-19 DIAGNOSIS — K219 Gastro-esophageal reflux disease without esophagitis: Secondary | ICD-10-CM | POA: Diagnosis present

## 2019-04-19 LAB — COMPREHENSIVE METABOLIC PANEL
ALT: 17 U/L (ref 0–44)
AST: 19 U/L (ref 15–41)
Albumin: 2.8 g/dL — ABNORMAL LOW (ref 3.5–5.0)
Alkaline Phosphatase: 82 U/L (ref 38–126)
Anion gap: 10 (ref 5–15)
BUN: 15 mg/dL (ref 6–20)
CO2: 24 mmol/L (ref 22–32)
Calcium: 9.2 mg/dL (ref 8.9–10.3)
Chloride: 97 mmol/L — ABNORMAL LOW (ref 98–111)
Creatinine, Ser: 0.77 mg/dL (ref 0.44–1.00)
GFR calc Af Amer: >60 mL/min (ref >60)
GFR calc non Af Amer: >60 mL/min (ref >60)
Glucose, Bld: 402 mg/dL — ABNORMAL HIGH (ref 70–99)
Potassium: 4.4 mmol/L (ref 3.5–5.1)
Sodium: 131 mmol/L — ABNORMAL LOW (ref 135–145)
Total Bilirubin: 0.3 mg/dL (ref 0.3–1.2)
Total Protein: 6.5 g/dL (ref 6.5–8.1)

## 2019-04-19 LAB — URINALYSIS, ROUTINE W REFLEX MICROSCOPIC
Bacteria, UA: NONE SEEN
Bilirubin Urine: NEGATIVE
Glucose, UA: >=500 mg/dL — AB
Hgb urine dipstick: NEGATIVE
Ketones, ur: 5 mg/dL — AB
Leukocytes,Ua: NEGATIVE
Nitrite: NEGATIVE
Protein, ur: NEGATIVE mg/dL
Specific Gravity, Urine: 1.027 (ref 1.005–1.030)
pH: 5 (ref 5.0–8.0)

## 2019-04-19 LAB — CBC WITH DIFFERENTIAL/PLATELET
Abs Immature Granulocytes: 0.02 10*3/uL (ref 0.00–0.07)
Basophils Absolute: 0 10*3/uL (ref 0.0–0.1)
Basophils Relative: 1 %
Eosinophils Absolute: 0.1 10*3/uL (ref 0.0–0.5)
Eosinophils Relative: 1 %
HCT: 32.7 % — ABNORMAL LOW (ref 36.0–46.0)
Hemoglobin: 10.7 g/dL — ABNORMAL LOW (ref 12.0–15.0)
Immature Granulocytes: 0 %
Lymphocytes Relative: 23 %
Lymphs Abs: 2 10*3/uL (ref 0.7–4.0)
MCH: 27.9 pg (ref 26.0–34.0)
MCHC: 32.7 g/dL (ref 30.0–36.0)
MCV: 85.4 fL (ref 80.0–100.0)
Monocytes Absolute: 0.7 10*3/uL (ref 0.1–1.0)
Monocytes Relative: 8 %
Neutro Abs: 6 10*3/uL (ref 1.7–7.7)
Neutrophils Relative %: 67 %
Platelets: 264 10*3/uL (ref 150–400)
RBC: 3.83 MIL/uL — ABNORMAL LOW (ref 3.87–5.11)
RDW: 12.5 % (ref 11.5–15.5)
WBC: 8.8 10*3/uL (ref 4.0–10.5)
nRBC: 0 % (ref 0.0–0.2)

## 2019-04-19 LAB — PREGNANCY, URINE: Preg Test, Ur: NEGATIVE

## 2019-04-19 LAB — I-STAT BETA HCG BLOOD, ED (MC, WL, AP ONLY): I-stat hCG, quantitative: 9.7 m[IU]/mL — ABNORMAL HIGH (ref <5)

## 2019-04-19 LAB — LACTIC ACID, PLASMA: Lactic Acid, Venous: 1 mmol/L (ref 0.5–1.9)

## 2019-04-19 MED ORDER — SODIUM CHLORIDE 0.9 % IV BOLUS
1000.0000 mL | Freq: Once | INTRAVENOUS | Status: AC
  Administered 2019-04-19: 1000 mL via INTRAVENOUS

## 2019-04-19 MED ORDER — SODIUM CHLORIDE 0.9 % IV SOLN: INTRAVENOUS | Status: DC

## 2019-04-19 MED ORDER — ACETAMINOPHEN 650 MG RE SUPP: 650.0000 mg | Freq: Four times a day (QID) | RECTAL | Status: DC | PRN

## 2019-04-19 MED ORDER — INSULIN ASPART 100 UNIT/ML ~~LOC~~ SOLN: 0.0000 [IU] | SUBCUTANEOUS | Status: DC

## 2019-04-19 MED ORDER — ENOXAPARIN SODIUM 40 MG/0.4ML ~~LOC~~ SOLN: 40.0000 mg | Freq: Every day | SUBCUTANEOUS | Status: DC

## 2019-04-19 MED ORDER — ACETAMINOPHEN 325 MG PO TABS: 650.0000 mg | ORAL_TABLET | Freq: Four times a day (QID) | ORAL | Status: DC | PRN

## 2019-04-19 MED ORDER — PRO-STAT SUGAR FREE PO LIQD: 30.0000 mL | Freq: Two times a day (BID) | ORAL | Status: DC

## 2019-04-19 NOTE — ED Notes (Addendum)
Another nurse in pod received call that our lab could not locate lactic acid sample. Another sample was collected and sent down

## 2019-04-19 NOTE — ED Notes (Addendum)
Paged admitting MD and called PA in ER to let them know that patient decided to leave AMA after refusing her COVID swab. Pt refusing to sign AMA form

## 2019-04-19 NOTE — ED Notes (Signed)
Sarah RN went into room to get patients covid test. Pt started yelling at Judson Roch, [place] and told her to call [facility] to get the results. Judson Roch, RN left room. Pt then used nurse call light, when this RN answered the call bell the pt started cussing and then preceded to throw things in the room. This RN went into pt room and tried to speak with pt to inform her of reason we needed covid test. Pt continued to cuss at RN and said she is going to leave. Pt refused to sign AMA form. IV removed. Dr. Maudie Mercury paged.

## 2019-04-19 NOTE — ED Notes (Signed)
Security took pt out in wheel chair. Pt shoes left in room, with multiple witnesses (secuirty, [employer], [place], EMT)  this RN asked pt if she wanted her shoes. Pt yelled at RN "I dont need no god damn shoes"

## 2019-04-19 NOTE — ED Notes (Signed)
This RN is offering to help pt. Offered pants and help with wheelchair. Pt states she doesn't need anything from [place].

## 2019-04-19 NOTE — ED Provider Notes (Signed)
[facility] EMERGENCY DEPARTMENT Provider Note   CSN: [id number] Arrival date & time: 04/18/19  2220     History   Chief Complaint Chief Complaint  Patient presents with  . Wound Check    HPI Katherine Franklin is a 53 y.o. female.     HPI   53 year old female with history of diabetes, drug abuse, GERD, hypertension, PTSD, schizoaffective schizophrenia presents the emergency department today for evaluation of cellulitis to the right lower extremity.  Was seen in the ED on 04/14/2019 and was started on doxycycline.  She states that she has started taking this medication but had progression of her symptoms.  States she was seen at [facility] earlier today and admitted and given IV antibiotics.  States she had imaging of her foot and had plans to go to the OR however she left AMA.  States her symptoms have worsened since onset and she has had progression of the redness and swelling of the right lower extremity.  She does not report any other associated symptoms.  Describes the pain in her foot as constant and severe in nature.  Is worse with palpation.  Reviewed record from visit earlier today.  Patient was in fact admitted to [facility] on 04/16/19. She was given broad spectrum IV abx.  She had MRI of the foot with and w/o contrast which showed, "Extensive diffuse subcutaneous edema likely representing cellulitis in the appropriate clinical setting. No visualized well-defined drainable abscess, however there is somewhat limited assessment given the susceptibility artifact from radiopaque foreign body involving the dorsal lateral aspect of the forefoot. No MR evidence of osteomyelitis. Multifocal degenerative changes. Some marginal erosions involving the 4th metatarsal base and 1st metatarsal head could be seen in conditions such as gadolinium. Cannot exclude inflammatory arthropathy." She had negative DVT study at that time.    Past Medical History:  Diagnosis Date  .  Diabetes mellitus without complication ([place])   . Drug abuse ([place]) 09/22/2018  . GERD (gastroesophageal reflux disease)   . Hypertension   . PTSD (post-traumatic stress disorder)   . Schizo affective schizophrenia [facility])     Patient Active Problem List   Diagnosis Date Noted  . Cellulitis 04/19/2019  . Substance induced mood disorder ([place]) 04/09/2019  . Homelessness 04/09/2019  . History of posttraumatic stress disorder (PTSD) 04/09/2019  . Cocaine dependence ([place]) 08/18/2018  . Major depressive disorder, recurrent severe without psychotic features ([place]) 08/18/2018    Past Surgical History:  Procedure Laterality Date  . SKIN GRAFT       OB History   No obstetric history on file.      Home Medications    Prior to Admission medications   Medication Sig Start Date End Date Taking? Authorizing Provider  ARIPiprazole (ABILIFY) 5 MG tablet Take 1 tablet (5 mg total) by mouth daily. Patient not taking: Reported on 04/08/2019 04/01/19   Lennice Sites, DO  doxycycline (VIBRAMYCIN) 100 MG capsule Take 1 capsule (100 mg total) by mouth 2 (two) times daily. One po bid x 7 days 04/14/19   Veryl Speak, MD  lisinopril (ZESTRIL) 20 MG tablet Take 1 tablet (20 mg total) by mouth daily. Patient not taking: Reported on 04/08/2019 04/01/19   Lennice Sites, DO  metFORMIN (GLUCOPHAGE) 500 MG tablet Take 1 tablet (500 mg total) by mouth 2 (two) times daily with a meal. Patient not taking: Reported on 04/08/2019 04/01/19   Lennice Sites, DO  sertraline (ZOLOFT) 25 MG tablet Take 1 tablet (25 mg  total) by mouth daily. Patient not taking: Reported on 04/08/2019 04/01/19   Virgina Norfolk, DO    Family History Family History  Family history unknown: Yes    Social History Social History   Tobacco Use  . Smoking status: Current Every Day Smoker    Packs/day: 2.00  . Smokeless tobacco: Never Used  Substance Use Topics  . Alcohol use: Not Currently    Frequency: Never  . Drug use: Yes     Frequency: 7.0 times per week    Types: Cocaine, "Crack" cocaine    Comment: Last used: Early this AM      Allergies   Sulfa antibiotics and Latex   Review of Systems Review of Systems  Constitutional: Negative for fever.  HENT: Negative for ear pain and sore throat.   Eyes: Negative for visual disturbance.  Respiratory: Negative for cough and shortness of breath.   Cardiovascular: Negative for chest pain.  Gastrointestinal: Negative for abdominal pain, constipation, diarrhea, nausea and vomiting.  Genitourinary: Negative for dysuria and hematuria.  Musculoskeletal:       Right foot pain  Skin: Positive for color change and wound.  Neurological: Negative for numbness.  All other systems reviewed and are negative.    Physical Exam Updated Vital Signs BP 118/73   Pulse 98   Temp 99.7 F (37.6 C) (Rectal)   Resp (!) 26   SpO2 100%   Physical Exam Vitals signs and nursing note reviewed.  Constitutional:      General: She is not in acute distress.    Appearance: She is well-developed.  HENT:     Head: Normocephalic and atraumatic.  Eyes:     Conjunctiva/sclera: Conjunctivae normal.  Neck:     Musculoskeletal: Neck supple.  Cardiovascular:     Rate and Rhythm: Normal rate and regular rhythm.     Heart sounds: No murmur.  Pulmonary:     Effort: Pulmonary effort is normal. No respiratory distress.     Breath sounds: Normal breath sounds.  Abdominal:     Palpations: Abdomen is soft.     Tenderness: There is no abdominal tenderness.  Musculoskeletal:        General: Swelling present.     Right lower leg: Edema present.     Comments: Significant TTP to the right foot and lower part of the RLE. No crepitus noted. There is a small area to the right lateral foot that is slightly fluctuant and white suggesting underlying area of purulence. In the middle of this are there is a punctate area of black (suspect underlying FB?). There is a punctate open wound. Compartments or  soft. Brisk cap refill distally.   Skin:    General: Skin is warm and dry.  Neurological:     Mental Status: She is alert.          ED Treatments / Results  Labs (all labs ordered are listed, but only abnormal results are displayed) Labs Reviewed  COMPREHENSIVE METABOLIC PANEL - Abnormal; Notable for the following components:      Result Value   Sodium 131 (*)    Chloride 97 (*)    Glucose, Bld 402 (*)    Albumin 2.8 (*)    All other components within normal limits  CBC WITH DIFFERENTIAL/PLATELET - Abnormal; Notable for the following components:   RBC 3.83 (*)    Hemoglobin 10.7 (*)    HCT 32.7 (*)    All other components within normal limits  URINALYSIS, ROUTINE W  REFLEX MICROSCOPIC - Abnormal; Notable for the following components:   Glucose, UA >=500 (*)    Ketones, ur 5 (*)    All other components within normal limits  I-STAT BETA HCG BLOOD, ED (MC, WL, AP ONLY) - Abnormal; Notable for the following components:   I-stat hCG, quantitative 9.7 (*)    All other components within normal limits  CULTURE, BLOOD (ROUTINE X 2)  CULTURE, BLOOD (ROUTINE X 2)  URINE CULTURE  SARS CORONAVIRUS 2 (TAT 6-24 HRS)  PREGNANCY, URINE  LACTIC ACID, PLASMA  COMPREHENSIVE METABOLIC PANEL  CBC  PROTIME-INR  SEDIMENTATION RATE  HEMOGLOBIN A1C    EKG EKG Interpretation  Date/Time:  Sunday April 19 2019 00:25:02 EST Ventricular Rate:  94 PR Interval:    QRS Duration: 80 QT Interval:  347 QTC Calculation: 434 R Axis:   34 Text Interpretation: Sinus rhythm Abnormal R-wave progression, early transition Confirmed by Nicanor AlconPalumbo, April ([id number]) on 04/19/2019 2:18:11 AM   Radiology Dg Chest Portable 1 View  Result Date: 04/19/2019 CLINICAL DATA:  Sepsis EXAM: PORTABLE CHEST 1 VIEW COMPARISON:  April 01, 2019 FINDINGS: The heart size and mediastinal contours are within normal limits. Both lungs are clear. The visualized skeletal structures are unremarkable. IMPRESSION: No active  disease. Electronically Signed   By: Jonna ClarkBindu  Avutu M.D.   On: 04/19/2019 01:00   Dg Foot Complete Right  Result Date: 04/18/2019 CLINICAL DATA:  Swelling and warmth to the touch. The pain is worse along the lateral side of the foot proximal to the fourth and fifth digits or she has a small wound. EXAM: RIGHT FOOT COMPLETE - 3+ VIEW COMPARISON:  None. FINDINGS: A linear density projects over the fifth metatarsal on the AP view in between the fourth and fifth metatarsals on the oblique view. Soft tissue swelling is identified, particularly in the region of this linear density. Oval lucency in the soft tissue thickening could represent soft tissue gas. No fractures are seen. No bony erosion identified. IMPRESSION: 1. A linear density in the soft tissues over the lateral right foot is identified, consistent with a foreign body. Given history, this foreign body is likely acute. There is soft tissue swelling in this region of the foot. There is an oval region of relative lucency in the soft tissues which could represent soft tissue gas. If this does represent gas, it could have been introduced from the small wound described in the history or from an infection secondary to a gas-forming organism. Recommend clinical correlation. Electronically Signed   By: Gerome Samavid  Williams III M.D   On: 04/18/2019 23:45    Procedures Procedures (including critical care time)  Medications Ordered in ED Medications  enoxaparin (LOVENOX) injection 40 mg (has no administration in time range)  acetaminophen (TYLENOL) tablet 650 mg (has no administration in time range)    Or  acetaminophen (TYLENOL) suppository 650 mg (has no administration in time range)  feeding supplement (PRO-STAT SUGAR FREE 64) liquid 30 mL (has no administration in time range)  insulin aspart (novoLOG) injection 0-9 Units (has no administration in time range)  0.9 %  sodium chloride infusion (has no administration in time range)  vancomycin (VANCOCIN) IVPB  1000 mg/200 mL premix (0 mg Intravenous Stopped 04/19/19 0201)  cefTRIAXone (ROCEPHIN) 2 g in sodium chloride 0.9 % 100 mL IVPB (0 g Intravenous Stopped 04/19/19 0055)  sodium chloride 0.9 % bolus 1,000 mL (1,000 mLs Intravenous New Bag/Given 04/19/19 0217)     Initial Impression / Assessment and Plan /  ED Course  I have reviewed the triage vital signs and the nursing notes.  Pertinent labs & imaging results that were available during my care of the patient were reviewed by me and considered in my medical decision making (see chart for details).   Final Clinical Impressions(s) / ED Diagnoses   Final diagnoses:  Wound discharge   53 year old female with history of diabetes presenting for evaluation of right lower extremity cellulitis which started about 5 days ago.  She has been on outpatient antibiotics.  She was actually admitted to [facility] yesterday and received IV antibiotics and was planned for surgery as she was noted to have foreign body within her foot likely causing the surrounding cellulitis.  She left AMA prior to this procedure.  On arrival she is borderline febrile, I personally checked her oral temperature which was 100.65F.  She is tachycardic.  She has no hypotension.  Given elevated temperature, tachycardia, suspected source of infection, code sepsis was initiated and broad-spectrum IV antibiotics were ordered. CBC is without leukocytosis.  Anemia is present which patient appears to have history of. CMP with hyponatremia, hypochloremia and elevated blood glucose.  Normal bicarb and normal anion gap.  Kidney and liver function are normal. Lactic acid pending on admission Blood cultures in process UA with glucosuria, no evidence for UTI   EKG with Sinus rhythm Abnormal R-wave progression, early transition  Chest x-ray neg  X-ray right foot with linear density in the soft tissues over the lateral right foot is identified, consistent with a foreign body. Given  history, this foreign body is likely acute. There is soft tissue swelling in this region of the foot. There is an oval region of relative lucency in the soft tissues which could represent soft tissue gas. If this does represent gas, it could have been introduced from the small wound described in the history or from an infection secondary to a gas-forming organism.   12:23 AM CONSULT with Dr. Linna Caprice, with orthopedics. He recommends keeping the patient NPO and Dr. Lajoyce Corners will see the patient tomorrow.   2:07 AM CONSULT with Dr. Selena Batten who accepts patient for admission  ED Discharge Orders    None       Rayne Du 04/19/19 0232    Palumbo, April, MD 04/23/19 [phone]

## 2019-04-19 NOTE — ED Notes (Signed)
Pt yelling and cursing staff.  Pt was dressing independently

## 2019-04-19 NOTE — H&P (Signed)
TRH H&P    Patient Demographics:    Katherine Franklin, is a 53 y.o. female  MRN: [id number]  DOB - [birth year]  Admit Date - 04/18/2019  Referring MD/NP/PA: Blinda Leatherwood  Outpatient Primary MD for the patient is Patient, No Pcp Per  Patient coming from:  homeless  Chief complaint- cellulitis   HPI:    Katherine Franklin  is a 53 y.o. female,  w hypertension, Dm2, Gerd,  substance abuse (coccaine), schizoaffective disorder apparently seen and admitted at [facility] 3 days ago for cellulitis right foot, but left AMA  Pt presents today due to worsening redness and swelling of the right lower extremity.     MRI 04/16/2019 MRI of the foot with and w/o contrast which showed, "Extensive diffuse subcutaneous edema likely representing cellulitis in the appropriate clinical setting. No visualized well-defined drainable abscess, however there is somewhat limited assessment given the susceptibility artifact from radiopaque foreign body involving the dorsal lateral aspect of the forefoot. No MR evidence of osteomyelitis. Multifocal degenerative changes. Some marginal erosions involving the 4th metatarsal base and 1st metatarsal head could be seen in conditions such as gadolinium. Cannot exclude inflammatory arthropathy." She had negative DVT study at that time.   In ED,   T 99.2, P 101, R 16,Bp 136/78  Pox 98% on RA  CT right foot pending  CXR IMPRESSION: No active disease.  Na 131, K4.4 Bun 15, Creatinin e0.77 Glucose 402 Ast 19, Alt 17 Alb 2.8 Wbc 8.8, Hgb 10.7, P.lt 264 Upreg negative Urinalysis negative  Blood culture x2  Pending  Pt will be admitted for cellulitis .         Review of systems:    In addition to the HPI above,  No Fever-chills, No Headache, No changes with Vision or hearing, No problems swallowing food or Liquids, No Chest pain, Cough or Shortness of Breath, No Abdominal pain, No  Nausea or Vomiting, bowel movements are regular, No Blood in stool or Urine, No dysuria,   No new joints pains-aches,  No new weakness, tingling, numbness in any extremity, No recent weight gain or loss, No polyuria, polydypsia or polyphagia, No significant Mental Stressors.  All other systems reviewed and are negative.    Past History of the following :    Past Medical History:  Diagnosis Date   Diabetes mellitus without complication ([place])    Drug abuse ([place]) 09/22/2018   GERD (gastroesophageal reflux disease)    Hypertension    PTSD (post-traumatic stress disorder)    Schizo affective schizophrenia ([place])       Past Surgical History:  Procedure Laterality Date   SKIN GRAFT        Social History:      Social History   Tobacco Use   Smoking status: Current Every Day Smoker    Packs/day: 2.00   Smokeless tobacco: Never Used  Substance Use Topics   Alcohol use: Not Currently    Frequency: Never       Family History :     Family History  Family  history unknown: Yes       Home Medications:   Prior to Admission medications   Medication Sig Start Date End Date Taking? Authorizing Provider  ARIPiprazole (ABILIFY) 5 MG tablet Take 1 tablet (5 mg total) by mouth daily. Patient not taking: Reported on 04/08/2019 04/01/19   Lennice Sites, DO  doxycycline (VIBRAMYCIN) 100 MG capsule Take 1 capsule (100 mg total) by mouth 2 (two) times daily. One po bid x 7 days 04/14/19   Veryl Speak, MD  lisinopril (ZESTRIL) 20 MG tablet Take 1 tablet (20 mg total) by mouth daily. Patient not taking: Reported on 04/08/2019 04/01/19   Lennice Sites, DO  metFORMIN (GLUCOPHAGE) 500 MG tablet Take 1 tablet (500 mg total) by mouth 2 (two) times daily with a meal. Patient not taking: Reported on 04/08/2019 04/01/19   Lennice Sites, DO  sertraline (ZOLOFT) 25 MG tablet Take 1 tablet (25 mg total) by mouth daily. Patient not taking: Reported on 04/08/2019 04/01/19    Lennice Sites, DO     Allergies:     Allergies  Allergen Reactions   Sulfa Antibiotics Shortness Of Breath and Rash   Latex Rash     Physical Exam:   Vitals  Blood pressure 124/83, pulse 98, temperature 99.7 F (37.6 C), temperature source Rectal, resp. rate (!) 27, SpO2 100 %.  1.  General: axoxo3  2. Psychiatric: euthymic  3. Neurologic: nonfocal  4. HEENMT:  Anicteric, pupils 1.53m symmetric,direct, consensual, intact Neck: no jvd  5. Respiratory : CTAB  6. Cardiovascular : rrr s1, s2,   7. Gastrointestinal:  Abd: soft, obese, nt, nd, +bs  8. Skin:  Ext: no c/c,  Edema of the right foot, along with redness of the dorsum of the right foot and redness of the medial ankle  9.Musculoskeletal:  Good ROM     Data Review:    CBC Recent Labs  Lab 04/19/19 0014  WBC 8.8  HGB 10.7*  HCT 32.7*  PLT 264  MCV 85.4  MCH 27.9  MCHC 32.7  RDW 12.5  LYMPHSABS 2.0  MONOABS 0.7  EOSABS 0.1  BASOSABS 0.0   ------------------------------------------------------------------------------------------------------------------  Results for orders placed or performed during the hospital encounter of 04/18/19 (from the past 48 hour(s))  Comprehensive metabolic panel     Status: Abnormal   Collection Time: 04/19/19 12:14 AM  Result Value Ref Range   Sodium 131 (L) 135 - 145 mmol/L   Potassium 4.4 3.5 - 5.1 mmol/L   Chloride 97 (L) 98 - 111 mmol/L   CO2 24 22 - 32 mmol/L   Glucose, Bld 402 (H) 70 - 99 mg/dL   BUN 15 6 - 20 mg/dL   Creatinine, Ser 0.77 0.44 - 1.00 mg/dL   Calcium 9.2 8.9 - 10.3 mg/dL   Total Protein 6.5 6.5 - 8.1 g/dL   Albumin 2.8 (L) 3.5 - 5.0 g/dL   AST 19 15 - 41 U/L   ALT 17 0 - 44 U/L   Alkaline Phosphatase 82 38 - 126 U/L   Total Bilirubin 0.3 0.3 - 1.2 mg/dL   GFR calc non Af Amer >60 >60 mL/min   GFR calc Af Amer >60 >60 mL/min   Anion gap 10 5 - 15    Comment: Performed at [facility] [address] [place] [address] CBC WITH DIFFERENTIAL     Status: Abnormal   Collection Time: 04/19/19 12:14 AM  Result Value Ref Range   WBC 8.8 4.0 -  10.5 K/uL   RBC 3.83 (L) 3.87 - 5.11 MIL/uL   Hemoglobin 10.7 (L) 12.0 - 15.0 g/dL   HCT 32.7 (L) 36.0 - 46.0 %   MCV 85.4 80.0 - 100.0 fL   MCH 27.9 26.0 - 34.0 pg   MCHC 32.7 30.0 - 36.0 g/dL   RDW 12.5 11.5 - 15.5 %   Platelets 264 150 - 400 K/uL   nRBC 0.0 0.0 - 0.2 %   Neutrophils Relative % 67 %   Neutro Abs 6.0 1.7 - 7.7 K/uL   Lymphocytes Relative 23 %   Lymphs Abs 2.0 0.7 - 4.0 K/uL   Monocytes Relative 8 %   Monocytes Absolute 0.7 0.1 - 1.0 K/uL   Eosinophils Relative 1 %   Eosinophils Absolute 0.1 0.0 - 0.5 K/uL   Basophils Relative 1 %   Basophils Absolute 0.0 0.0 - 0.1 K/uL   Immature Granulocytes 0 %   Abs Immature Granulocytes 0.02 0.00 - 0.07 K/uL    Comment: Performed at [facility] [address], [place], [place] [address]  Urinalysis, Routine w reflex microscopic     Status: Abnormal   Collection Time: 04/19/19 12:14 AM  Result Value Ref Range   Color, Urine YELLOW YELLOW   APPearance CLEAR CLEAR   Specific Gravity, Urine 1.027 1.005 - 1.030   pH 5.0 5.0 - 8.0   Glucose, UA >=500 (A) NEGATIVE mg/dL   Hgb urine dipstick NEGATIVE NEGATIVE   Bilirubin Urine NEGATIVE NEGATIVE   Ketones, ur 5 (A) NEGATIVE mg/dL   Protein, ur NEGATIVE NEGATIVE mg/dL   Nitrite NEGATIVE NEGATIVE   Leukocytes,Ua NEGATIVE NEGATIVE   RBC / HPF 0-5 0 - 5 RBC/hpf   WBC, UA 0-5 0 - 5 WBC/hpf   Bacteria, UA NONE SEEN NONE SEEN   Squamous Epithelial / LPF 0-5 0 - 5   Mucus PRESENT     Comment: Performed at [facility], [place] [address] [place], [place] [address]  Pregnancy, urine     Status: None   Collection Time: 04/19/19 12:14 AM  Result Value Ref Range   Preg Test, Ur NEGATIVE NEGATIVE    Comment: Performed at [facility] [address], [place], [place] [address]  I-Stat beta hCG blood, ED     Status: Abnormal   Collection  Time: 04/19/19 12:40 AM  Result Value Ref Range   I-stat hCG, quantitative 9.7 (H) <5 mIU/mL   Comment 3            Comment:   GEST. AGE      CONC.  (mIU/mL)   <=1 WEEK        5 - 50     2 WEEKS       50 - 500     3 WEEKS       100 - 10,000     4 WEEKS     1,000 - 30,000        FEMALE AND NON-PREGNANT FEMALE:     LESS THAN 5 mIU/mL     Chemistries  Recent Labs  Lab 04/19/19 0014  NA 131*  K 4.4  CL 97*  CO2 24  GLUCOSE 402*  BUN 15  CREATININE 0.77  CALCIUM 9.2  AST 19  ALT 17  ALKPHOS 82  BILITOT 0.3   ------------------------------------------------------------------------------------------------------------------  ------------------------------------------------------------------------------------------------------------------ GFR: CrCl cannot be calculated (Unknown ideal weight.). Liver Function Tests: Recent Labs  Lab 04/19/19 0014  AST 19  ALT  17  ALKPHOS 82  BILITOT 0.3  PROT 6.5  ALBUMIN 2.8*   No results for input(s): LIPASE, AMYLASE in the last 168 hours. No results for input(s): AMMONIA in the last 168 hours. Coagulation Profile: No results for input(s): INR, PROTIME in the last 168 hours. Cardiac Enzymes: No results for input(s): CKTOTAL, CKMB, CKMBINDEX, TROPONINI in the last 168 hours. BNP (last 3 results) No results for input(s): PROBNP in the last 8760 hours. HbA1C: No results for input(s): HGBA1C in the last 72 hours. CBG: No results for input(s): GLUCAP in the last 168 hours. Lipid Profile: No results for input(s): CHOL, HDL, LDLCALC, TRIG, CHOLHDL, LDLDIRECT in the last 72 hours. Thyroid Function Tests: No results for input(s): TSH, T4TOTAL, FREET4, T3FREE, THYROIDAB in the last 72 hours. Anemia Panel: No results for input(s): VITAMINB12, FOLATE, FERRITIN, TIBC, IRON, RETICCTPCT in the last 72 hours.  --------------------------------------------------------------------------------------------------------------- Urine analysis:      Component Value Date/Time   COLORURINE YELLOW 04/19/2019 0014   APPEARANCEUR CLEAR 04/19/2019 0014   LABSPEC 1.027 04/19/2019 0014   PHURINE 5.0 04/19/2019 0014   GLUCOSEU >=500 (A) 04/19/2019 0014   HGBUR NEGATIVE 04/19/2019 0014   BILIRUBINUR NEGATIVE 04/19/2019 0014   KETONESUR 5 (A) 04/19/2019 0014   PROTEINUR NEGATIVE 04/19/2019 0014   NITRITE NEGATIVE 04/19/2019 0014   LEUKOCYTESUR NEGATIVE 04/19/2019 0014      Imaging Results:    Dg Chest Portable 1 View  Result Date: 04/19/2019 CLINICAL DATA:  Sepsis EXAM: PORTABLE CHEST 1 VIEW COMPARISON:  April 01, 2019 FINDINGS: The heart size and mediastinal contours are within normal limits. Both lungs are clear. The visualized skeletal structures are unremarkable. IMPRESSION: No active disease. Electronically Signed   By: Prudencio Pair M.D.   On: 04/19/2019 01:00   Dg Foot Complete Right  Result Date: 04/18/2019 CLINICAL DATA:  Swelling and warmth to the touch. The pain is worse along the lateral side of the foot proximal to the fourth and fifth digits or she has a small wound. EXAM: RIGHT FOOT COMPLETE - 3+ VIEW COMPARISON:  None. FINDINGS: A linear density projects over the fifth metatarsal on the AP view in between the fourth and fifth metatarsals on the oblique view. Soft tissue swelling is identified, particularly in the region of this linear density. Oval lucency in the soft tissue thickening could represent soft tissue gas. No fractures are seen. No bony erosion identified. IMPRESSION: 1. A linear density in the soft tissues over the lateral right foot is identified, consistent with a foreign body. Given history, this foreign body is likely acute. There is soft tissue swelling in this region of the foot. There is an oval region of relative lucency in the soft tissues which could represent soft tissue gas. If this does represent gas, it could have been introduced from the small wound described in the history or from an infection  secondary to a gas-forming organism. Recommend clinical correlation. Electronically Signed   By: Dorise Bullion III M.D   On: 04/18/2019 23:45        Assessment & Plan:    Active Problems:   Major depressive disorder, recurrent severe without psychotic features ([place])   Cellulitis  Cellulitis Blood culture x2 pending Check ESR NPO except for medications Vanco iv , rocephin iv pharmacy to dose Orthopedics consulted by ED, ([place]),  Per ED, Sharol Given to see in AM. Might want to verify in AM that patient is on list to be seen  Dm2 Fsbs q4h, ISS  Hypertension  Cont Lisinopril 31m po qday  DVT Prophylaxis-   Lovenox - SCDs   AM Labs Ordered, also please review Full Orders  Family Communication: Admission, patients condition and plan of care including tests being ordered have been discussed with the patient who indicate understanding and agree with the plan and Code Status.  Code Status:  FULL CODE per patient  Admission status:  Inpatient: Based on patients clinical presentation and evaluation of above clinical data, I have made determination that patient meets Inpatient criteria at this time.  Pt will require iv abx, and has high risk of clinical deterioration. Pt might require surgery. For ? Foreign body. Pt will need > 2 nites stay.  Time spent in minutes : 55 minutes   JJani GravelM.D on 04/19/2019 at 2:14 AM

## 2019-04-19 NOTE — ED Notes (Signed)
Dr. Maudie Mercury in room attempting to talk with pt on why it is in her best interest to stay at hospital. Pt is cussing at Dr. Maudie Mercury.

## 2019-04-19 NOTE — ED Notes (Signed)
Patient refusing COVID swab multiple times of asking. Explained to patient she could not be admitted without getting the swab. Patient stated "leave my room!"

## 2019-04-19 NOTE — ED Notes (Addendum)
Obtained doppler on right foot. +2 pulse noted. Site marked

## 2019-04-20 ENCOUNTER — Other Ambulatory Visit: Payer: Self-pay

## 2019-04-20 ENCOUNTER — Encounter (HOSPITAL_COMMUNITY): Payer: Self-pay | Admitting: Emergency Medicine

## 2019-04-20 ENCOUNTER — Inpatient Hospital Stay (HOSPITAL_COMMUNITY)
Admission: EM | Admit: 2019-04-20 | Discharge: 2019-04-20 | DRG: 603 | Discharge status: Against Medical Advice | Payer: Medicaid Other | Attending: Internal Medicine | Admitting: Internal Medicine

## 2019-04-20 DIAGNOSIS — E1165 Type 2 diabetes mellitus with hyperglycemia: Secondary | ICD-10-CM | POA: Diagnosis present

## 2019-04-20 DIAGNOSIS — Z20828 Contact with and (suspected) exposure to other viral communicable diseases: Secondary | ICD-10-CM | POA: Diagnosis present

## 2019-04-20 DIAGNOSIS — M79671 Pain in right foot: Secondary | ICD-10-CM | POA: Diagnosis not present

## 2019-04-20 DIAGNOSIS — F17210 Nicotine dependence, cigarettes, uncomplicated: Secondary | ICD-10-CM | POA: Diagnosis present

## 2019-04-20 DIAGNOSIS — Z91040 Latex allergy status: Secondary | ICD-10-CM | POA: Diagnosis not present

## 2019-04-20 DIAGNOSIS — K219 Gastro-esophageal reflux disease without esophagitis: Secondary | ICD-10-CM | POA: Diagnosis present

## 2019-04-20 DIAGNOSIS — Z7984 Long term (current) use of oral hypoglycemic drugs: Secondary | ICD-10-CM | POA: Diagnosis not present

## 2019-04-20 DIAGNOSIS — Z9114 Patient's other noncompliance with medication regimen: Secondary | ICD-10-CM

## 2019-04-20 DIAGNOSIS — L03115 Cellulitis of right lower limb: Secondary | ICD-10-CM | POA: Diagnosis not present

## 2019-04-20 DIAGNOSIS — Z882 Allergy status to sulfonamides status: Secondary | ICD-10-CM | POA: Diagnosis not present

## 2019-04-20 DIAGNOSIS — I10 Essential (primary) hypertension: Secondary | ICD-10-CM | POA: Diagnosis present

## 2019-04-20 DIAGNOSIS — L0390 Cellulitis, unspecified: Secondary | ICD-10-CM | POA: Diagnosis present

## 2019-04-20 DIAGNOSIS — F259 Schizoaffective disorder, unspecified: Secondary | ICD-10-CM | POA: Diagnosis present

## 2019-04-20 DIAGNOSIS — Z590 Homelessness: Secondary | ICD-10-CM

## 2019-04-20 DIAGNOSIS — F4310 Post-traumatic stress disorder, unspecified: Secondary | ICD-10-CM | POA: Diagnosis present

## 2019-04-20 DIAGNOSIS — L03119 Cellulitis of unspecified part of limb: Secondary | ICD-10-CM

## 2019-04-20 DIAGNOSIS — S90851D Superficial foreign body, right foot, subsequent encounter: Secondary | ICD-10-CM

## 2019-04-20 LAB — CBC WITH DIFFERENTIAL/PLATELET
Abs Immature Granulocytes: 0.03 10*3/uL (ref 0.00–0.07)
Basophils Absolute: 0.1 10*3/uL (ref 0.0–0.1)
Basophils Relative: 1 %
Eosinophils Absolute: 0.1 10*3/uL (ref 0.0–0.5)
Eosinophils Relative: 2 %
HCT: 35.4 % — ABNORMAL LOW (ref 36.0–46.0)
Hemoglobin: 11.1 g/dL — ABNORMAL LOW (ref 12.0–15.0)
Immature Granulocytes: 0 %
Lymphocytes Relative: 24 %
Lymphs Abs: 2.1 10*3/uL (ref 0.7–4.0)
MCH: 27.2 pg (ref 26.0–34.0)
MCHC: 31.4 g/dL (ref 30.0–36.0)
MCV: 86.8 fL (ref 80.0–100.0)
Monocytes Absolute: 0.6 10*3/uL (ref 0.1–1.0)
Monocytes Relative: 7 %
Neutro Abs: 5.8 10*3/uL (ref 1.7–7.7)
Neutrophils Relative %: 66 %
Platelets: 306 10*3/uL (ref 150–400)
RBC: 4.08 MIL/uL (ref 3.87–5.11)
RDW: 12.7 % (ref 11.5–15.5)
WBC: 8.7 10*3/uL (ref 4.0–10.5)
nRBC: 0 % (ref 0.0–0.2)

## 2019-04-20 LAB — BASIC METABOLIC PANEL
Anion gap: 10 (ref 5–15)
BUN: 12 mg/dL (ref 6–20)
CO2: 27 mmol/L (ref 22–32)
Calcium: 9.4 mg/dL (ref 8.9–10.3)
Chloride: 99 mmol/L (ref 98–111)
Creatinine, Ser: 0.76 mg/dL (ref 0.44–1.00)
GFR calc Af Amer: >60 mL/min (ref >60)
GFR calc non Af Amer: >60 mL/min (ref >60)
Glucose, Bld: 537 mg/dL (ref 70–99)
Potassium: 4.3 mmol/L (ref 3.5–5.1)
Sodium: 136 mmol/L (ref 135–145)

## 2019-04-20 LAB — URINE CULTURE: Culture: NO GROWTH

## 2019-04-20 LAB — SARS CORONAVIRUS 2 (TAT 6-24 HRS): SARS Coronavirus 2: NEGATIVE

## 2019-04-20 MED ORDER — PIPERACILLIN-TAZOBACTAM 3.375 G IVPB 30 MIN: 3.3750 g | Freq: Once | INTRAVENOUS | Status: DC

## 2019-04-20 MED ORDER — PIPERACILLIN-TAZOBACTAM 3.375 G IVPB: 3.3750 g | Freq: Three times a day (TID) | INTRAVENOUS | Status: DC

## 2019-04-20 MED ORDER — INSULIN ASPART 100 UNIT/ML ~~LOC~~ SOLN
10.0000 [IU] | Freq: Once | SUBCUTANEOUS | Status: AC
  Administered 2019-04-20: 16:00:00 10 [IU] via INTRAVENOUS
  Filled 2019-04-20: qty 0.1

## 2019-04-20 MED ORDER — INSULIN ASPART 100 UNIT/ML ~~LOC~~ SOLN
0.0000 [IU] | Freq: Three times a day (TID) | SUBCUTANEOUS | Status: DC
  Filled 2019-04-20: qty 0.15

## 2019-04-20 MED ORDER — VANCOMYCIN HCL 10 G IV SOLR
2000.0000 mg | Freq: Once | INTRAVENOUS | Status: AC
  Administered 2019-04-20: 13:00:00 2000 mg via INTRAVENOUS
  Filled 2019-04-20: qty 2000

## 2019-04-20 MED ORDER — HALOPERIDOL LACTATE 5 MG/ML IJ SOLN
2.0000 mg | Freq: Once | INTRAMUSCULAR | Status: AC
  Administered 2019-04-20: 2 mg via INTRAVENOUS
  Filled 2019-04-20: qty 1

## 2019-04-20 MED ORDER — VANCOMYCIN HCL IN DEXTROSE 1-5 GM/200ML-% IV SOLN: 1000.0000 mg | Freq: Two times a day (BID) | INTRAVENOUS | Status: DC

## 2019-04-20 MED ORDER — LORAZEPAM 2 MG/ML IJ SOLN
1.0000 mg | Freq: Once | INTRAMUSCULAR | Status: AC
  Administered 2019-04-20: 1 mg via INTRAVENOUS
  Filled 2019-04-20: qty 1

## 2019-04-20 MED ORDER — INSULIN ASPART 100 UNIT/ML ~~LOC~~ SOLN
0.0000 [IU] | SUBCUTANEOUS | Status: DC
  Filled 2019-04-20: qty 0.15

## 2019-04-20 MED ORDER — LACTATED RINGERS IV BOLUS
1000.0000 mL | Freq: Once | INTRAVENOUS | Status: AC
  Administered 2019-04-20: 1000 mL via INTRAVENOUS

## 2019-04-20 NOTE — ED Notes (Addendum)
Pt became irate when she thought she lost her "snacks" that she had brought with her. PT spread her legs to expose her vagina and was yelling at staff while she was hitting the stretcher. Pt wanted to leave. Pt also pulled her dress down to expose her breasts and sat on the bed exposed while yelling at staff. Security was called along with the MD. MD said she could leave AMA if she wanted. Pt settled down after she ate. Pt was able to ambulate without assistance. She was CAOx4, answering questions appropriately.

## 2019-04-20 NOTE — ED Notes (Addendum)
Pt started yelling in her room that she wanted a blanket. Pt was punching the stretcher and cursing at staff. Pt was quiet after she received a blanket. 5 minutes later pt started yelling again and stated she wanted to leave. Security was called. MD came and saw pt. Pt was escorted out of the ED by security.

## 2019-04-20 NOTE — ED Triage Notes (Signed)
Per EMS pt complain of ankle/feet swelling (both); pt also noted to be hyperglycemic; noncompliant with medication.

## 2019-04-20 NOTE — Progress Notes (Signed)
Pharmacy Antibiotic Note  Katherine Franklin is a 53 y.o. female admitted on 04/20/2019 with DM foot infection.  Pharmacy has been consulted for vancomycin and Zosyn dosing.  Plan:  Vancomycin 2000 mg IV now, then 1000 mg IV q12 hr (est AUC 526 based on SCr 0.8)  Measure vancomycin AUC at steady state as indicated  Zosyn 3.375 g IV given once over 30 minutes, then every 8 hrs by 4-hr infusion  SCr daily while on vanc + Zosyn     Temp (24hrs), Avg:98.4 F (36.9 C), Min:98.4 F (36.9 C), Max:98.4 F (36.9 C)  Recent Labs  Lab 04/19/19 0014 04/19/19 0207 04/20/19 1315  WBC 8.8  --  8.7  CREATININE 0.77  --  0.76  LATICACIDVEN  --  1.0  --     CrCl cannot be calculated (Unknown ideal weight.).    Allergies  Allergen Reactions  . Sulfa Antibiotics Shortness Of Breath and Rash  . Latex Rash    Antimicrobials this admission: 12/7 vanc >>  12/7 Zosyn >>   Dose adjustments this admission: n/a  Microbiology results: 12/6 BCx: ngtd 12/6 UCx: NGF   Thank you for allowing pharmacy to be a part of this patient's care.  Reuel Boom, PharmD, BCPS [phone] 04/20/2019, 5:39 PM

## 2019-04-20 NOTE — ED Notes (Signed)
Date and time results received: 04/20/19 3:05 PM  Test: Glucose Critical Value: 537  Name of Provider Notified: Wilson Singer MD  Orders Received? Or Actions Taken?: acknowledges result

## 2019-04-20 NOTE — Consult Note (Signed)
Patient ID: Katherine Franklin MRN: [id number] DOB/AGE: 54-Jan-1967 54 y.o.  Admit date: 04/20/2019  Chief Complaint: Right foot pain  Subjective: Katherine Franklin is a 53 y/o female who presented to [facility] Emergency Department with right foot pain and swelling.   Was admitted to [facility] on 04/16/19 and presented to the [facility] ED as well on 04/18/2019 for same. Left AMA at both facilities prior to treatment. MRI of the right foot was obtained at [facility] which showed: "Extensive diffuse subcutaneous edema likely representing cellulitis in the appropriate clinical setting. No visualized well-defined drainable abscess, however there is somewhat limited assessment given the susceptibility artifact from radiopaque foreign body involving the dorsal lateral aspect of the forefoot. No MR evidence of osteomyelitis. Multifocal degenerative changes. Some marginal erosions involving the 4th metatarsal base and 1st metatarsal head could be seen in conditions such as gadolinium. Cannot exclude inflammatory arthropathy." Workup for DVT was negative.  Plan was for admission at [facility] but left AMA as well. Pt again presented to [facility] and orthopedics was consulted. Pt is sleepy but arousable upon evaluation. States the right foot has been painful and swollen since December 1st. Pt a poor historian, and nodded off several times during conversation. States that she plans to be admitted and will not leave AMA this time.    Allergies: Allergies  Allergen Reactions  . Sulfa Antibiotics Shortness Of Breath and Rash  . Latex Rash     Medications: (Not in a hospital admission)   Past Medical History: Past Medical History:  Diagnosis Date  . Diabetes mellitus without complication ([facility])   . Drug abuse ([facility]) 09/22/2018  . GERD (gastroesophageal reflux disease)   . Hypertension   . PTSD (post-traumatic stress disorder)   . Schizo affective schizophrenia [facility])      Past Surgical History: Past Surgical History:   Procedure Laterality Date  . SKIN GRAFT       Family History: Family History  Family history unknown: Yes    Social History: Social History   Tobacco Use  . Smoking status: Current Every Day Smoker    Packs/day: 2.00  . Smokeless tobacco: Never Used  Substance Use Topics  . Alcohol use: Not Currently    Frequency: Never     Review of Systems Pertinent items are noted in HPI.  Physical Exam:  Vitals: Pulse:104 bpm Respirations:18 bpm Blood Pressure:158/98 mmHg  General: cooperative and fatigued HENT:Head: Normocephalic, no lesions, without obvious abnormality. Neck:supple Musculoskeletal: Right foot exam: Dorsal surface with skin opening and focal swelling, no purulent drainage. Generalized swelling and erythema of the foot. 5/5 strength with plantar and dorsiflexion. Distal pulses intact. Capillary refill < 2 seconds. Motor function intact, wiggling toes and moving foot on cue.  LABS: Results for orders placed or performed during the hospital encounter of 04/20/19  CBC with Differential  Result Value Ref Range   WBC 8.7 4.0 - 10.5 K/uL   RBC 4.08 3.87 - 5.11 MIL/uL   Hemoglobin 11.1 (L) 12.0 - 15.0 g/dL   HCT 25.3 (L) 66.4 - 40.3 %   MCV 86.8 80.0 - 100.0 fL   MCH 27.2 26.0 - 34.0 pg   MCHC 31.4 30.0 - 36.0 g/dL   RDW 47.4 25.9 - 56.3 %   Platelets 306 150 - 400 K/uL   nRBC 0.0 0.0 - 0.2 %   Neutrophils Relative % 66 %   Neutro Abs 5.8 1.7 - 7.7 K/uL   Lymphocytes Relative 24 %   Lymphs  Abs 2.1 0.7 - 4.0 K/uL   Monocytes Relative 7 %   Monocytes Absolute 0.6 0.1 - 1.0 K/uL   Eosinophils Relative 2 %   Eosinophils Absolute 0.1 0.0 - 0.5 K/uL   Basophils Relative 1 %   Basophils Absolute 0.1 0.0 - 0.1 K/uL   WBC Morphology MORPHOLOGY UNREMARKABLE    Immature Granulocytes 0 %   Abs Immature Granulocytes 0.03 0.00 - 0.07 K/uL   Recent Labs    04/19/19 0014 04/20/19 1315  HGB 10.7* 11.1*   Recent Labs    04/19/19 0014 04/20/19 1315  WBC 8.8 8.7   RBC 3.83* 4.08  HCT 32.7* 35.4*  PLT 264 306   Recent Labs    04/19/19 0014  NA 131*  K 4.4  CL 97*  CO2 24  BUN 15  CREATININE 0.77  GLUCOSE 402*  CALCIUM 9.2   Assessment/Plan: Cellulitis, right foot with questionable foreign object  The patient is being admitted to [facility] with hospitalist service. Will most likely need I&D of the right foot if patient will agree. IV vancomycin in place in meantime.   Patient was evaluated by Dr. Wynelle Link in Emergency Department, was noted to have abscess on dorsum of right foot. Pt was sedated at time and unable to interact for an appropriate surgical discussion. Dr. Wynelle Link went upstairs to round on post-operative patients, and the patient was escorted out of the emergency room by security during that 20 minute time span thus a surgical discussion did not occur. The patient did not return to the emergency department that evening.  Theresa Duty, PA-C Orthopedic Surgery [facility] [place]

## 2019-04-20 NOTE — ED Provider Notes (Addendum)
[facility]   CSN: [id number] Arrival date & time: 04/20/19  1142     History   Chief Complaint Chief Complaint  Patient presents with  . Ankle Pain  . Hyperglycemia    HPI Katherine Franklin is a 53 y.o. female.     HPI   53yF with R foot pain. Multiple recent evaluations for the same.   Patient was admitted to [facility] on 04/16/19. She was given broad spectrum IV abx.  She had MRI of the foot with and w/o contrast which showed:  "Extensive diffuse subcutaneous edema likely representing cellulitis in the appropriate clinical setting. No visualized well-defined drainable abscess, however there is somewhat limited assessment given the susceptibility artifact from radiopaque foreign body involving the dorsal lateral aspect of the forefoot. No MR evidence of osteomyelitis. Multifocal degenerative changes. Some marginal erosions involving the 4th metatarsal base and 1st metatarsal head could be seen in conditions such as gadolinium. Cannot exclude inflammatory arthropathy." She had a negative DVT study at that time as well. She left AMA.   She presented to the ED at [facility] on 04/18/19. Initial plan was for admission but she again subsequently left AMA very early yesterday. She tells me that she is willing to be admitted this time and "I'll be good."    Past Medical History:  Diagnosis Date  . Diabetes mellitus without complication (HCC)   . Drug abuse (HCC) 09/22/2018  . GERD (gastroesophageal reflux disease)   . Hypertension   . PTSD (post-traumatic stress disorder)   . Schizo affective schizophrenia [facility])     Patient Active Problem List   Diagnosis Date Noted  . Cellulitis 04/19/2019  . Substance induced mood disorder (HCC) 04/09/2019  . Homelessness 04/09/2019  . History of posttraumatic stress disorder (PTSD) 04/09/2019  . Cocaine dependence (HCC) 08/18/2018  . Major depressive disorder, recurrent severe without psychotic  features (HCC) 08/18/2018   Past Surgical History:  Procedure Laterality Date  . SKIN GRAFT      OB History   No obstetric history on file.    Home Medications    Prior to Admission medications   Medication Sig Start Date End Date Taking? Authorizing Provider  ARIPiprazole (ABILIFY) 5 MG tablet Take 1 tablet (5 mg total) by mouth daily. Patient not taking: Reported on 04/08/2019 04/01/19   Virgina Norfolk, DO  doxycycline (VIBRAMYCIN) 100 MG capsule Take 1 capsule (100 mg total) by mouth 2 (two) times daily. One po bid x 7 days 04/14/19   Geoffery Lyons, MD  lisinopril (ZESTRIL) 20 MG tablet Take 1 tablet (20 mg total) by mouth daily. Patient not taking: Reported on 04/08/2019 04/01/19   Virgina Norfolk, DO  metFORMIN (GLUCOPHAGE) 500 MG tablet Take 1 tablet (500 mg total) by mouth 2 (two) times daily with a meal. Patient not taking: Reported on 04/08/2019 04/01/19   Virgina Norfolk, DO  sertraline (ZOLOFT) 25 MG tablet Take 1 tablet (25 mg total) by mouth daily. Patient not taking: Reported on 04/08/2019 04/01/19   Virgina Norfolk, DO    Family History Family History  Family history unknown: Yes    Social History Social History   Tobacco Use  . Smoking status: Current Every Day Smoker    Packs/day: 2.00  . Smokeless tobacco: Never Used  Substance Use Topics  . Alcohol use: Not Currently    Frequency: Never  . Drug use: Yes    Frequency: 7.0 times per week    Types: Cocaine, "Crack"  cocaine    Comment: Last used: Early this AM      Allergies   Sulfa antibiotics and Latex   Review of Systems Review of Systems   All systems reviewed and negative, other than as noted in HPI.  Physical Exam Updated Vital Signs BP (!) 158/98   Pulse (!) 104   Temp 98.4 F (36.9 C)   Resp 18   SpO2 100%   Physical Exam Vitals signs and nursing note reviewed.  Constitutional:      General: She is not in acute distress.    Appearance: She is well-developed. She is obese.  HENT:      Head: Normocephalic and atraumatic.  Eyes:     General:        Right eye: No discharge.        Left eye: No discharge.     Conjunctiva/sclera: Conjunctivae normal.  Neck:     Musculoskeletal: Neck supple.  Cardiovascular:     Rate and Rhythm: Normal rate and regular rhythm.     Heart sounds: Normal heart sounds. No murmur. No friction rub. No gallop.   Pulmonary:     Effort: Pulmonary effort is normal. No respiratory distress.     Breath sounds: Normal breath sounds.  Abdominal:     General: There is no distension.     Palpations: Abdomen is soft.     Tenderness: There is no abdominal tenderness.  Musculoskeletal:        General: Tenderness present.     Comments: Right foot swelling more pronounced on the lateral aspect.  Couple punctate areas of purulent drainage.  Tender to palpation.  Overlying cellulitis.  Skin:    General: Skin is warm and dry.  Neurological:     Mental Status: She is alert.  Psychiatric:        Behavior: Behavior normal.        Thought Content: Thought content normal.      ED Treatments / Results  Labs (all labs ordered are listed, but only abnormal results are displayed) Labs Reviewed  BASIC METABOLIC PANEL - Abnormal; Notable for the following components:      Result Value   Glucose, Bld 537 (*)    All other components within normal limits  CBC WITH DIFFERENTIAL/PLATELET - Abnormal; Notable for the following components:   Hemoglobin 11.1 (*)    HCT 35.4 (*)    All other components within normal limits  SARS CORONAVIRUS 2 (TAT 6-24 HRS)    EKG None  Radiology Dg Chest Portable 1 View  Result Date: 04/19/2019 CLINICAL DATA:  Sepsis EXAM: PORTABLE CHEST 1 VIEW COMPARISON:  April 01, 2019 FINDINGS: The heart size and mediastinal contours are within normal limits. Both lungs are clear. The visualized skeletal structures are unremarkable. IMPRESSION: No active disease. Electronically Signed   By: Prudencio Pair M.D.   On: 04/19/2019 01:00    Dg Foot Complete Right  Result Date: 04/18/2019 CLINICAL DATA:  Swelling and warmth to the touch. The pain is worse along the lateral side of the foot proximal to the fourth and fifth digits or she has a small wound. EXAM: RIGHT FOOT COMPLETE - 3+ VIEW COMPARISON:  None. FINDINGS: A linear density projects over the fifth metatarsal on the AP view in between the fourth and fifth metatarsals on the oblique view. Soft tissue swelling is identified, particularly in the region of this linear density. Oval lucency in the soft tissue thickening could represent soft tissue gas. No fractures  are seen. No bony erosion identified. IMPRESSION: 1. A linear density in the soft tissues over the lateral right foot is identified, consistent with a foreign body. Given history, this foreign body is likely acute. There is soft tissue swelling in this region of the foot. There is an oval region of relative lucency in the soft tissues which could represent soft tissue gas. If this does represent gas, it could have been introduced from the small wound described in the history or from an infection secondary to a gas-forming organism. Recommend clinical correlation. Electronically Signed   By: Gerome Samavid  Williams III M.D   On: 04/18/2019 23:45    Procedures Procedures (including critical care time)  Medications Ordered in ED Medications  vancomycin (VANCOCIN) 2,000 mg in sodium chloride 0.9 % 500 mL IVPB (has no administration in time range)  LORazepam (ATIVAN) injection 1 mg (has no administration in time range)  haloperidol lactate (HALDOL) injection 2 mg (has no administration in time range)     Initial Impression / Assessment and Plan / ED Course  I have reviewed the triage vital signs and the nursing notes.  Pertinent labs & imaging results that were available during my care of the patient were reviewed by me and considered in my medical decision making (see chart for details).     53yF with R foot pain. Has  purulent drainage on my exam. Recent imaging with cellulitis. Likely won't improve with abx alone with probable foreign body present. Abx restarted. Pt now calm and agreeable but has left AMA from different hospitals in the past week. This is currently her biggest barrier to care. Will obtain psychiatric consult for determination of competency or possible psychiatric medication recommendations. Ativan and low dose haldol given for time being. Will discuss with orthopedic surgery. Hospitalist admission.   Final Clinical Impressions(s) / ED Diagnoses   Final diagnoses:  Cellulitis of foot  Foreign body in right foot, subsequent encounter    ED Discharge Orders    None       Raeford RazorKohut, Dakiya Puopolo, MD 04/20/19 1455    Raeford RazorKohut, Hennesy Sobalvarro, MD 04/21/19 [phone]

## 2019-04-20 NOTE — H&P (Addendum)
History and Physical    Katherine SchimkeMechell Konitzer ZOX:[id number] DOB: 07-20-1965 DOA: 04/20/2019  PCP: Patient, No Pcp Per  Patient coming from: Home  I have personally briefly [facility]  Chief Complaint: Foot pain  HPI: Katherine SchimkeMechell Streets is a 53 y.o. female with medical history significant of T2DM, GERD, HTN, PTSD, Schioaffective disorder, and hx of cocaine abuse who presents with Foot pain.  Patient of note recently seen twice in the past week and has left AMA both times.    Patient was extremely agitated in our ER, pacing up and down with security nearby.  She was medicated with Haldol and Ativan thus somnolent at the time of my assessment with primary RN.  Collateral history obtained reveals that the patient is homeless.  She was brought in by EMS.  She thought she had a spider bite but also thought she hit her foot against a cinder block and that had lodge itself in her foot in part.  She is noted to be non-compliant with medications.  Per ROS obtained yesterday by Dr. Selena BattenKim she had denies fevers, chills at that time.  No other issues with chest pain/sob/abd pain/n/v/dysuria.  Review of Systems: As per HPI otherwise 10 point review of systems negative.    Past Medical History:  Diagnosis Date   Diabetes mellitus without complication (HCC)    Drug abuse (HCC) 09/22/2018   GERD (gastroesophageal reflux disease)    Hypertension    PTSD (post-traumatic stress disorder)    Schizo affective schizophrenia (HCC)     Past Surgical History:  Procedure Laterality Date   SKIN GRAFT       reports that she has been smoking. She has been smoking about 2.00 packs per day. She has never used smokeless tobacco. She reports previous alcohol use. She reports current drug use. Frequency: 7.00 times per week. Drugs: Cocaine and "Crack" cocaine.  Allergies  Allergen Reactions   Sulfa Antibiotics Shortness Of Breath and Rash   Latex Rash    Family  History  Family history unknown: Yes   Patient is somnolent now from meds, unable to detail her family hx.  Prior to Admission medications   Medication Sig Start Date End Date Taking? Authorizing Provider  ARIPiprazole (ABILIFY) 5 MG tablet Take 1 tablet (5 mg total) by mouth daily. Patient not taking: Reported on 04/08/2019 04/01/19   Virgina Norfolkuratolo, Adam, DO  doxycycline (VIBRAMYCIN) 100 MG capsule Take 1 capsule (100 mg total) by mouth 2 (two) times daily. One po bid x 7 days Patient not taking: Reported on 04/20/2019 04/14/19   Geoffery Lyonselo, Douglas, MD  lisinopril (ZESTRIL) 20 MG tablet Take 1 tablet (20 mg total) by mouth daily. Patient not taking: Reported on 04/08/2019 04/01/19   Virgina Norfolkuratolo, Adam, DO  metFORMIN (GLUCOPHAGE) 500 MG tablet Take 1 tablet (500 mg total) by mouth 2 (two) times daily with a meal. Patient not taking: Reported on 04/08/2019 04/01/19   Virgina Norfolkuratolo, Adam, DO  sertraline (ZOLOFT) 25 MG tablet Take 1 tablet (25 mg total) by mouth daily. Patient not taking: Reported on 04/08/2019 04/01/19   Virgina Norfolkuratolo, Adam, DO    Physical Exam: Vitals:   04/20/19 1158 04/20/19 1200 04/20/19 1540  BP: (!) 158/98  (!) 156/86  Pulse: (!) 104  96  Resp: 18  16  Temp: 98.4 F (36.9 C)    SpO2: 100% 100% 98%    Constitutional: NAD, calm, comfortable Vitals:   04/20/19 1158 04/20/19 1200 04/20/19 1540  BP: Marland Kitchen(!)  158/98  (!) 156/86  Pulse: (!) 104  96  Resp: 18  16  Temp: 98.4 F (36.9 C)    SpO2: 100% 100% 98%   General: somnolent Eyes: PERRL, lids and conjunctivae normal ENMT: Mucous membranes are moist. Posterior pharynx clear of any exudate or lesions.Normal dentition.  Neck: normal, supple, no masses, no thyromegaly Respiratory: clear to auscultation bilaterally, no wheezing, no crackles. Normal respiratory effort. No accessory muscle use.  Cardiovascular: Regular rate and rhythm, no murmurs / rubs / gallops. No extremity edema. 2+ pedal pulses. No carotid bruits.  Abdomen: no  tenderness, no masses palpated. No hepatosplenomegaly. Bowel sounds positive.  Musculoskeletal: R foot swollen, purulent-sanguinous drainage from dorsolateral aspect of foot Skin: no rashes, lesions, ulcers. No induration Neurologic: Limited exam due to somnolennce Psychiatric: Heavily guarded    Labs on Admission: I have personally reviewed following labs and imaging studies  CBC: Recent Labs  Lab 04/19/19 0014 04/20/19 1315  WBC 8.8 8.7  NEUTROABS 6.0 5.8  HGB 10.7* 11.1*  HCT 32.7* 35.4*  MCV 85.4 86.8  PLT 264 132   Basic Metabolic Panel: Recent Labs  Lab 04/19/19 0014 04/20/19 1315  NA 131* 136  K 4.4 4.3  CL 97* 99  CO2 24 27  GLUCOSE 402* 537*  BUN 15 12  CREATININE 0.77 0.76  CALCIUM 9.2 9.4   GFR: CrCl cannot be calculated (Unknown ideal weight.). Liver Function Tests: Recent Labs  Lab 04/19/19 0014  AST 19  ALT 17  ALKPHOS 82  BILITOT 0.3  PROT 6.5  ALBUMIN 2.8*   No results for input(s): LIPASE, AMYLASE in the last 168 hours. No results for input(s): AMMONIA in the last 168 hours. Coagulation Profile: No results for input(s): INR, PROTIME in the last 168 hours. Cardiac Enzymes: No results for input(s): CKTOTAL, CKMB, CKMBINDEX, TROPONINI in the last 168 hours. BNP (last 3 results) No results for input(s): PROBNP in the last 8760 hours. HbA1C: No results for input(s): HGBA1C in the last 72 hours. CBG: No results for input(s): GLUCAP in the last 168 hours. Lipid Profile: No results for input(s): CHOL, HDL, LDLCALC, TRIG, CHOLHDL, LDLDIRECT in the last 72 hours. Thyroid Function Tests: No results for input(s): TSH, T4TOTAL, FREET4, T3FREE, THYROIDAB in the last 72 hours. Anemia Panel: No results for input(s): VITAMINB12, FOLATE, FERRITIN, TIBC, IRON, RETICCTPCT in the last 72 hours. Urine analysis:    Component Value Date/Time   COLORURINE YELLOW 04/19/2019 0014   APPEARANCEUR CLEAR 04/19/2019 0014   LABSPEC 1.027 04/19/2019 0014    PHURINE 5.0 04/19/2019 0014   GLUCOSEU >=500 (A) 04/19/2019 0014   HGBUR NEGATIVE 04/19/2019 0014   BILIRUBINUR NEGATIVE 04/19/2019 0014   KETONESUR 5 (A) 04/19/2019 0014   PROTEINUR NEGATIVE 04/19/2019 0014   NITRITE NEGATIVE 04/19/2019 0014   LEUKOCYTESUR NEGATIVE 04/19/2019 0014    Radiological Exams on Admission: Dg Chest Portable 1 View  Result Date: 04/19/2019 CLINICAL DATA:  Sepsis EXAM: PORTABLE CHEST 1 VIEW COMPARISON:  April 01, 2019 FINDINGS: The heart size and mediastinal contours are within normal limits. Both lungs are clear. The visualized skeletal structures are unremarkable. IMPRESSION: No active disease. Electronically Signed   By: Prudencio Pair M.D.   On: 04/19/2019 01:00   Dg Foot Complete Right  Result Date: 04/18/2019 CLINICAL DATA:  Swelling and warmth to the touch. The pain is worse along the lateral side of the foot proximal to the fourth and fifth digits or she has a small wound. EXAM: RIGHT FOOT COMPLETE -  3+ VIEW COMPARISON:  None. FINDINGS: A linear density projects over the fifth metatarsal on the AP view in between the fourth and fifth metatarsals on the oblique view. Soft tissue swelling is identified, particularly in the region of this linear density. Oval lucency in the soft tissue thickening could represent soft tissue gas. No fractures are seen. No bony erosion identified. IMPRESSION: 1. A linear density in the soft tissues over the lateral right foot is identified, consistent with a foreign body. Given history, this foreign body is likely acute. There is soft tissue swelling in this region of the foot. There is an oval region of relative lucency in the soft tissues which could represent soft tissue gas. If this does represent gas, it could have been introduced from the small wound described in the history or from an infection secondary to a gas-forming organism. Recommend clinical correlation. Electronically Signed   By: Gerome Sam III M.D   On: 04/18/2019  23:45    Assessment/Plan Heli Heimann is a 53 y.o. female with medical history significant of T2DM, GERD, HTN, PTSD, Schioaffective disorder, and hx of cocaine abuse who presents with Foot pain and R foot cellulitis  # Purulent R Foot Cellulitis - active drainage from R foot, with pus, will culture.  Plain film reveal concern for possible foreign body.  There was plan for further imaging but patient has not been very compliant.  Orthopedic surgery consulted and will evaluate the patient in AM, if there is foreign body, removal will be important to this improving.  She did undergo [facility] at [facility] on the 5th which did not reveal osteomyelitis or abscess.  Foreign body commented on then. - given homeless, diabetic, poorly controlled and off of meds, she is at risk for a polymicrobial infection - will culture drainage from wound, she has recent blood cultures drawn, will empirically Rx with Vanc and Zosyn  # T2DM - A1c pending - hyperglycemic, again concern for non-compliance with meds (reports not taking any) - ISS and accuchecks  # HTN - monitor BP, ideally would resume ACEi as tolerated  # PTSD # Schizoaffective Disorder - psychiatry consult for capacity and medication management   DVT prophylaxis: Lovenox Code Status: Full Disposition Plan: pending Consults called: Ortho, Psych Admission status: med-surg   Clydia Llano MD [facility] Pager [phone]  If 7PM-7AM, please contact night-coverage [email or web address] Password [facility]  04/20/2019, 4:44 PM     Addendum: Patient was combative and she was discharged after evaluation by the ER physician.  I was not notified of this but appears c/w recent stays.

## 2019-04-24 ENCOUNTER — Emergency Department (HOSPITAL_COMMUNITY)
Admission: EM | Admit: 2019-04-24 | Discharge: 2019-04-24 | Disposition: A | Discharge status: Home / Self Care | Payer: Medicaid Other | Attending: Emergency Medicine | Admitting: Emergency Medicine

## 2019-04-24 ENCOUNTER — Other Ambulatory Visit: Payer: Self-pay

## 2019-04-24 ENCOUNTER — Encounter (HOSPITAL_COMMUNITY): Payer: Self-pay | Admitting: *Deleted

## 2019-04-24 DIAGNOSIS — Z5321 Procedure and treatment not carried out due to patient leaving prior to being seen by health care provider: Secondary | ICD-10-CM | POA: Diagnosis not present

## 2019-04-24 DIAGNOSIS — W57XXXD Bitten or stung by nonvenomous insect and other nonvenomous arthropods, subsequent encounter: Secondary | ICD-10-CM | POA: Insufficient documentation

## 2019-04-24 DIAGNOSIS — E1165 Type 2 diabetes mellitus with hyperglycemia: Secondary | ICD-10-CM | POA: Diagnosis not present

## 2019-04-24 DIAGNOSIS — S90861D Insect bite (nonvenomous), right foot, subsequent encounter: Secondary | ICD-10-CM | POA: Insufficient documentation

## 2019-04-24 LAB — CBC
HCT: 35 % — ABNORMAL LOW (ref 36.0–46.0)
Hemoglobin: 11.1 g/dL — ABNORMAL LOW (ref 12.0–15.0)
MCH: 27.1 pg (ref 26.0–34.0)
MCHC: 31.7 g/dL (ref 30.0–36.0)
MCV: 85.6 fL (ref 80.0–100.0)
Platelets: 382 10*3/uL (ref 150–400)
RBC: 4.09 MIL/uL (ref 3.87–5.11)
RDW: 12.5 % (ref 11.5–15.5)
WBC: 5.3 10*3/uL (ref 4.0–10.5)
nRBC: 0 % (ref 0.0–0.2)

## 2019-04-24 LAB — BASIC METABOLIC PANEL
Anion gap: 10 (ref 5–15)
BUN: 14 mg/dL (ref 6–20)
CO2: 26 mmol/L (ref 22–32)
Calcium: 9.4 mg/dL (ref 8.9–10.3)
Chloride: 98 mmol/L (ref 98–111)
Creatinine, Ser: 0.81 mg/dL (ref 0.44–1.00)
GFR calc Af Amer: >60 mL/min (ref >60)
GFR calc non Af Amer: >60 mL/min (ref >60)
Glucose, Bld: 470 mg/dL — ABNORMAL HIGH (ref 70–99)
Potassium: 5.2 mmol/L — ABNORMAL HIGH (ref 3.5–5.1)
Sodium: 134 mmol/L — ABNORMAL LOW (ref 135–145)

## 2019-04-24 LAB — CULTURE, BLOOD (ROUTINE X 2)
Culture: NO GROWTH
Culture: NO GROWTH
Special Requests: ADEQUATE
Special Requests: ADEQUATE

## 2019-04-24 LAB — I-STAT BETA HCG BLOOD, ED (MC, WL, AP ONLY): I-stat hCG, quantitative: <5 m[IU]/mL (ref <5)

## 2019-04-24 NOTE — ED Notes (Signed)
Pt back in lobby asking where her bed is. Redirected pt to assigned bed.

## 2019-04-24 NOTE — ED Triage Notes (Signed)
Pt says she lost her antibiotics for her foot wound about 3 days ago, says the pain and swelling is worse. Denies fevers. Reporting she does not take diabetes medications because she cannot afford them.

## 2019-04-24 NOTE — ED Notes (Signed)
Pt cursing at staff, being disruptive to patient care. Uncooperative with attending MD for ativan, haldol injection, admission to hospital for surgery for foot. Pt continued to yell curse, stated she wasn't having any damn surgery, inappropriate. Escorted out by security. Verbal AMA, witnessed by multiple staff and attending MD Wilson Singer.

## 2019-04-24 NOTE — ED Triage Notes (Signed)
Pt was bit in the right foot by a spider on the 1st of December, pt has been evaluated for the same, lost her antibiotics. CBG 510.

## 2019-04-25 ENCOUNTER — Other Ambulatory Visit: Payer: Self-pay

## 2019-04-25 ENCOUNTER — Emergency Department (HOSPITAL_COMMUNITY)
Admission: EM | Admit: 2019-04-25 | Discharge: 2019-04-25 | Discharge status: Absent without leave | Payer: Medicaid Other

## 2019-04-25 ENCOUNTER — Encounter (HOSPITAL_COMMUNITY): Payer: Self-pay | Admitting: Emergency Medicine

## 2019-04-25 NOTE — ED Notes (Signed)
Pt refused vitals and states, "there is too much COVID in this building and I am leaving."

## 2019-04-25 NOTE — ED Notes (Signed)
Pt refused labs for hyperglycemia and states that isn't why she is here.

## 2019-04-25 NOTE — ED Triage Notes (Signed)
Pt to triage via [facility] from shelter.  C/o R foot pain and swelling since getting bit by spider on Dec. 1st.  Denies fever.  Seen in ED for same yesterday.  CBG 490 per EMS.

## 2019-04-26 ENCOUNTER — Other Ambulatory Visit: Payer: Self-pay

## 2019-04-26 ENCOUNTER — Emergency Department (HOSPITAL_COMMUNITY)
Admission: EM | Admit: 2019-04-26 | Discharge: 2019-04-26 | Disposition: A | Discharge status: Home / Self Care | Payer: Medicaid Other | Attending: Emergency Medicine | Admitting: Emergency Medicine

## 2019-04-26 ENCOUNTER — Encounter (HOSPITAL_COMMUNITY): Payer: Self-pay | Admitting: Emergency Medicine

## 2019-04-26 ENCOUNTER — Emergency Department (HOSPITAL_COMMUNITY)
Admission: EM | Admit: 2019-04-26 | Discharge: 2019-04-26 | Disposition: A | Discharge status: Home / Self Care | Payer: Medicaid Other | Source: Home / Self Care | Attending: Emergency Medicine | Admitting: Emergency Medicine

## 2019-04-26 DIAGNOSIS — F17200 Nicotine dependence, unspecified, uncomplicated: Secondary | ICD-10-CM | POA: Insufficient documentation

## 2019-04-26 DIAGNOSIS — Z7984 Long term (current) use of oral hypoglycemic drugs: Secondary | ICD-10-CM | POA: Insufficient documentation

## 2019-04-26 DIAGNOSIS — Z5321 Procedure and treatment not carried out due to patient leaving prior to being seen by health care provider: Secondary | ICD-10-CM | POA: Diagnosis not present

## 2019-04-26 DIAGNOSIS — I10 Essential (primary) hypertension: Secondary | ICD-10-CM | POA: Insufficient documentation

## 2019-04-26 DIAGNOSIS — L0291 Cutaneous abscess, unspecified: Secondary | ICD-10-CM

## 2019-04-26 DIAGNOSIS — L02611 Cutaneous abscess of right foot: Secondary | ICD-10-CM | POA: Insufficient documentation

## 2019-04-26 DIAGNOSIS — Z590 Homelessness: Secondary | ICD-10-CM | POA: Insufficient documentation

## 2019-04-26 DIAGNOSIS — E119 Type 2 diabetes mellitus without complications: Secondary | ICD-10-CM | POA: Insufficient documentation

## 2019-04-26 DIAGNOSIS — R2240 Localized swelling, mass and lump, unspecified lower limb: Secondary | ICD-10-CM | POA: Insufficient documentation

## 2019-04-26 MED ORDER — FUROSEMIDE 40 MG PO TABS: 20.0000 mg | ORAL_TABLET | Freq: Once | ORAL | Status: DC

## 2019-04-26 MED ORDER — DOXYCYCLINE HYCLATE 100 MG PO TABS: 100.0000 mg | ORAL_TABLET | Freq: Once | ORAL | Status: DC

## 2019-04-26 MED ORDER — DOXYCYCLINE HYCLATE 100 MG PO CAPS: 100.0000 mg | ORAL_CAPSULE | Freq: Two times a day (BID) | ORAL | 0 refills | Status: DC

## 2019-04-26 MED ORDER — METFORMIN HCL 500 MG PO TABS: 500.0000 mg | ORAL_TABLET | Freq: Two times a day (BID) | ORAL | 0 refills | Status: DC
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
## 2019-04-26 NOTE — ED Triage Notes (Signed)
BIB EMS from group home. Reports R foot pain/swelling since Dec 1st. States she was bit by a spider... has been seen multiple times for same.

## 2019-04-26 NOTE — ED Provider Notes (Signed)
[facility]-EMERGENCY DEPT Provider Note   CSN: [id number] Arrival date & time: 04/26/19  0247     History Chief Complaint  Patient presents with  . Leg Swelling    Katherine Franklin is a 53 y.o. female.  53 yo F with a chief complaints of cellulitis and abscess to the right foot.  This been going on for at least the past couple weeks.  She has had multiple ED visits for the same.  Usually leaves AGAINST MEDICAL ADVICE prior to being seen.  At the onset she was prescribed doxycycline.  She tells me that she has lost this medication and wanted another prescription for the same.  She also has run out of her Metformin and would like a refill.  She feels it is gotten much better over the course of a couple weeks.  Has had some watery and whitish drainage coming from the area.  She told me that a spider bit her and that it laid eggs there.  The history is provided by the patient.  Illness Severity:  Moderate Onset quality:  Gradual Duration:  2 weeks Timing:  Constant Progression:  Worsening Chronicity:  New Associated symptoms: no chest pain, no congestion, no fever, no headaches, no myalgias, no nausea, no rhinorrhea, no shortness of breath, no vomiting and no wheezing        Past Medical History:  Diagnosis Date  . Diabetes mellitus without complication (HCC)   . Drug abuse (HCC) 09/22/2018  . GERD (gastroesophageal reflux disease)   . Hypertension   . PTSD (post-traumatic stress disorder)   . Schizo affective schizophrenia [facility]HCC)     Patient Active Problem List   Diagnosis Date Noted  . Cellulitis 04/19/2019  . Substance induced mood disorder (HCC) 04/09/2019  . Homelessness 04/09/2019  . History of posttraumatic stress disorder (PTSD) 04/09/2019  . Cocaine dependence (HCC) 08/18/2018  . Major depressive disorder, recurrent severe without psychotic features (HCC) 08/18/2018    Past Surgical History:  Procedure Laterality Date  . SKIN GRAFT        OB History   No obstetric history on file.     Family History  Family history unknown: Yes    Social History   Tobacco Use  . Smoking status: Current Every Day Smoker    Packs/day: 2.00  . Smokeless tobacco: Never Used  Substance Use Topics  . Alcohol use: Not Currently  . Drug use: Yes    Frequency: 7.0 times per week    Types: Cocaine, "Crack" cocaine    Comment: Last used: Early this AM     Home Medications Prior to Admission medications   Medication Sig Start Date End Date Taking? Authorizing Provider  ARIPiprazole (ABILIFY) 5 MG tablet Take 1 tablet (5 mg total) by mouth daily. Patient not taking: Reported on 04/08/2019 04/01/19   Virgina Norfolkuratolo, Adam, DO  doxycycline (VIBRAMYCIN) 100 MG capsule Take 1 capsule (100 mg total) by mouth 2 (two) times daily. One po bid x 7 days 04/26/19   Melene PlanFloyd, Kelso Bibby, DO  lisinopril (ZESTRIL) 20 MG tablet Take 1 tablet (20 mg total) by mouth daily. Patient not taking: Reported on 04/08/2019 04/01/19   Virgina Norfolkuratolo, Adam, DO  metFORMIN (GLUCOPHAGE) 500 MG tablet Take 1 tablet (500 mg total) by mouth 2 (two) times daily with a meal. 04/26/19   Melene PlanFloyd, Fama Muenchow, DO  sertraline (ZOLOFT) 25 MG tablet Take 1 tablet (25 mg total) by mouth daily. Patient not taking: Reported on 04/08/2019 04/01/19   Curatolo,  Adam, DO    Allergies    Sulfa antibiotics and Latex  Review of Systems   Review of Systems  Constitutional: Negative for chills and fever.  HENT: Negative for congestion and rhinorrhea.   Eyes: Negative for redness and visual disturbance.  Respiratory: Negative for shortness of breath and wheezing.   Cardiovascular: Positive for leg swelling. Negative for chest pain and palpitations.  Gastrointestinal: Negative for nausea and vomiting.  Genitourinary: Negative for dysuria and urgency.  Musculoskeletal: Negative for arthralgias and myalgias.  Skin: Positive for wound. Negative for pallor.  Neurological: Negative for dizziness and headaches.     Physical Exam Updated Vital Signs There were no vitals taken for this visit.  Physical Exam Vitals and nursing note reviewed.  Constitutional:      General: She is not in acute distress.    Appearance: She is well-developed. She is not diaphoretic.  HENT:     Head: Normocephalic and atraumatic.  Eyes:     Pupils: Pupils are equal, round, and reactive to light.  Cardiovascular:     Rate and Rhythm: Normal rate and regular rhythm.     Heart sounds: No murmur. No friction rub. No gallop.   Pulmonary:     Effort: Pulmonary effort is normal.     Breath sounds: No wheezing or rales.  Abdominal:     General: There is no distension.     Palpations: Abdomen is soft.     Tenderness: There is no abdominal tenderness.  Musculoskeletal:        General: No tenderness.     Cervical back: Normal range of motion and neck supple.  Skin:    General: Skin is warm and dry.     Comments: Erosion of some of the skin on the dorsal aspect of the right foot.  There is a wound to the lateral aspect of the foot with watery drainage.  No palpable fluctuance or induration.  There is some surrounding erythema and edema.  Neurological:     Mental Status: She is alert and oriented to person, place, and time.  Psychiatric:        Behavior: Behavior normal.     ED Results / Procedures / Treatments   Labs (all labs ordered are listed, but only abnormal results are displayed) Labs Reviewed - No data to display  EKG None  Radiology No results found.  Procedures Procedures (including critical care time)  Medications Ordered in ED Medications  furosemide (LASIX) tablet 20 mg (has no administration in time range)  doxycycline (VIBRA-TABS) tablet 100 mg (has no administration in time range)    ED Course  I have reviewed the triage vital signs and the nursing notes.  Pertinent labs & imaging results that were available during my care of the patient were reviewed by me and considered in my  medical decision making (see chart for details).    MDM Rules/Calculators/A&P     CHA2DS2/VAS Stroke Risk Points      N/A >= 2 Points: High Risk  1 - 1.99 Points: Medium Risk  0 Points: Low Risk    A final score could not be computed because of missing components.: Last  Change: N/A     This score determines the patient's risk of having a stroke if the  patient has atrial fibrillation.      This score is not applicable to this patient. Components are not  calculated.  53 yo F with a chief complaints of cellulitis and abscess to the right lateral aspect of the foot.  I offered I&D which she declined.  The patient unfortunately has mental illness which I think limits her ability to get medical care.  She has been to the ED multiple times for this but this has not been able to wait for care.  Will give her outpatient follow-up.  Refill prescription for doxycycline.  3:08 AM:  I have discussed the diagnosis/risks/treatment options with the patient and believe the pt to be eligible for discharge home to follow-up with PCP. We also discussed returning to the ED immediately if new or worsening sx occur. We discussed the sx which are most concerning (e.g., sudden worsening pain, fever, inability to tolerate by mouth) that necessitate immediate return. Medications administered to the patient during their visit and any new prescriptions provided to the patient are listed below.  Medications given during this visit Medications  furosemide (LASIX) tablet 20 mg (has no administration in time range)  doxycycline (VIBRA-TABS) tablet 100 mg (has no administration in time range)     The patient appears reasonably screen and/or stabilized for discharge and I doubt any other medical condition or other [facility] requiring further screening, evaluation, or treatment in the ED at this time prior to discharge.   Final Clinical Impression(s) / ED Diagnoses Final diagnoses:  Abscess    Rx / DC  Orders ED Discharge Orders         Ordered    doxycycline (VIBRAMYCIN) 100 MG capsule  2 times daily     04/26/19 0303    metFORMIN (GLUCOPHAGE) 500 MG tablet  2 times daily with meals     04/26/19 0303           Melene Plan, DO 04/26/19 0308

## 2019-04-26 NOTE — ED Notes (Signed)
Patient escorted off property. Per Mortimer Fries pt did receive paperwork.

## 2019-04-26 NOTE — ED Notes (Signed)
Patient yelling at staff. EMS director at bedside for issue steaming from transport. Security called. Bobby RN at bedside.

## 2019-04-26 NOTE — Discharge Instructions (Signed)
Apply a warm compress to the affected area at least 4 times a day.  Get a washcloth get it wet put it in the microwave make sure not to burn you and hold it on for at least 10 to 15 minutes.  Please return for rapid spreading redness or fever.  I have given you follow-up with someone who can help you out getting medicines and medical care.

## 2019-04-26 NOTE — ED Notes (Signed)
Pt was visibly upset and stated that she needed to have some food and make some money. Pt also advised that she will not wait and was leaving the hospital [facility]

## 2019-04-28 ENCOUNTER — Encounter (HOSPITAL_COMMUNITY): Payer: Self-pay | Admitting: Nurse Practitioner

## 2019-04-28 ENCOUNTER — Observation Stay (HOSPITAL_COMMUNITY)
Admission: RE | Admit: 2019-04-28 | Discharge: 2019-04-28 | Disposition: A | Discharge status: Home / Self Care | Payer: Medicaid Other | Attending: Psychiatry | Admitting: Psychiatry

## 2019-04-28 ENCOUNTER — Other Ambulatory Visit: Payer: Self-pay

## 2019-04-28 DIAGNOSIS — Z590 Homelessness: Secondary | ICD-10-CM | POA: Insufficient documentation

## 2019-04-28 DIAGNOSIS — Z79899 Other long term (current) drug therapy: Secondary | ICD-10-CM | POA: Insufficient documentation

## 2019-04-28 DIAGNOSIS — F17210 Nicotine dependence, cigarettes, uncomplicated: Secondary | ICD-10-CM | POA: Diagnosis not present

## 2019-04-28 DIAGNOSIS — F1994 Other psychoactive substance use, unspecified with psychoactive substance-induced mood disorder: Secondary | ICD-10-CM | POA: Diagnosis present

## 2019-04-28 DIAGNOSIS — Z91040 Latex allergy status: Secondary | ICD-10-CM | POA: Insufficient documentation

## 2019-04-28 DIAGNOSIS — F4310 Post-traumatic stress disorder, unspecified: Secondary | ICD-10-CM | POA: Diagnosis not present

## 2019-04-28 DIAGNOSIS — F259 Schizoaffective disorder, unspecified: Principal | ICD-10-CM | POA: Insufficient documentation

## 2019-04-28 DIAGNOSIS — Z20828 Contact with and (suspected) exposure to other viral communicable diseases: Secondary | ICD-10-CM | POA: Diagnosis not present

## 2019-04-28 DIAGNOSIS — E119 Type 2 diabetes mellitus without complications: Secondary | ICD-10-CM | POA: Diagnosis not present

## 2019-04-28 DIAGNOSIS — G4700 Insomnia, unspecified: Secondary | ICD-10-CM | POA: Diagnosis not present

## 2019-04-28 DIAGNOSIS — Z882 Allergy status to sulfonamides status: Secondary | ICD-10-CM | POA: Insufficient documentation

## 2019-04-28 DIAGNOSIS — K219 Gastro-esophageal reflux disease without esophagitis: Secondary | ICD-10-CM | POA: Insufficient documentation

## 2019-04-28 DIAGNOSIS — I10 Essential (primary) hypertension: Secondary | ICD-10-CM | POA: Insufficient documentation

## 2019-04-28 DIAGNOSIS — F319 Bipolar disorder, unspecified: Secondary | ICD-10-CM | POA: Insufficient documentation

## 2019-04-28 DIAGNOSIS — Z7984 Long term (current) use of oral hypoglycemic drugs: Secondary | ICD-10-CM | POA: Diagnosis not present

## 2019-04-28 DIAGNOSIS — Z881 Allergy status to other antibiotic agents status: Secondary | ICD-10-CM | POA: Insufficient documentation

## 2019-04-28 LAB — RAPID URINE DRUG SCREEN, HOSP PERFORMED
Amphetamines: NOT DETECTED
Barbiturates: NOT DETECTED
Benzodiazepines: NOT DETECTED
Cocaine: NOT DETECTED
Opiates: NOT DETECTED
Tetrahydrocannabinol: NOT DETECTED

## 2019-04-28 LAB — COMPREHENSIVE METABOLIC PANEL
ALT: 26 U/L (ref 0–44)
AST: 12 U/L — ABNORMAL LOW (ref 15–41)
Albumin: 3.6 g/dL (ref 3.5–5.0)
Alkaline Phosphatase: 76 U/L (ref 38–126)
Anion gap: 10 (ref 5–15)
BUN: 21 mg/dL — ABNORMAL HIGH (ref 6–20)
CO2: 27 mmol/L (ref 22–32)
Calcium: 9.6 mg/dL (ref 8.9–10.3)
Chloride: 95 mmol/L — ABNORMAL LOW (ref 98–111)
Creatinine, Ser: 0.79 mg/dL (ref 0.44–1.00)
GFR calc Af Amer: >60 mL/min (ref >60)
GFR calc non Af Amer: >60 mL/min (ref >60)
Glucose, Bld: 406 mg/dL — ABNORMAL HIGH (ref 70–99)
Potassium: 4.3 mmol/L (ref 3.5–5.1)
Sodium: 132 mmol/L — ABNORMAL LOW (ref 135–145)
Total Bilirubin: 0.8 mg/dL (ref 0.3–1.2)
Total Protein: 7.6 g/dL (ref 6.5–8.1)

## 2019-04-28 LAB — CBC
HCT: 36.3 % (ref 36.0–46.0)
Hemoglobin: 11.6 g/dL — ABNORMAL LOW (ref 12.0–15.0)
MCH: 27.7 pg (ref 26.0–34.0)
MCHC: 32 g/dL (ref 30.0–36.0)
MCV: 86.6 fL (ref 80.0–100.0)
Platelets: 422 K/uL — ABNORMAL HIGH (ref 150–400)
RBC: 4.19 MIL/uL (ref 3.87–5.11)
RDW: 12.9 % (ref 11.5–15.5)
WBC: 4.8 K/uL (ref 4.0–10.5)
nRBC: 0 % (ref 0.0–0.2)

## 2019-04-28 LAB — RESPIRATORY PANEL BY RT PCR (FLU A&B, COVID)
Influenza A by PCR: NEGATIVE
Influenza B by PCR: NEGATIVE
SARS Coronavirus 2 by RT PCR: NEGATIVE

## 2019-04-28 LAB — GLUCOSE, CAPILLARY: Glucose-Capillary: 360 mg/dL — ABNORMAL HIGH (ref 70–99)

## 2019-04-28 MED ORDER — HYDROXYZINE HCL 25 MG PO TABS: 25.0000 mg | ORAL_TABLET | Freq: Three times a day (TID) | ORAL | Status: DC | PRN

## 2019-04-28 MED ORDER — METFORMIN HCL 500 MG PO TABS: 500.0000 mg | ORAL_TABLET | Freq: Two times a day (BID) | ORAL | 0 refills | Status: DC

## 2019-04-28 MED ORDER — METFORMIN HCL 500 MG PO TABS
500.0000 mg | ORAL_TABLET | Freq: Two times a day (BID) | ORAL | Status: DC
  Administered 2019-04-28: 500 mg via ORAL
  Filled 2019-04-28: qty 1

## 2019-04-28 MED ORDER — SERTRALINE HCL 25 MG PO TABS: 25.0000 mg | ORAL_TABLET | Freq: Every day | ORAL | 0 refills | Status: DC

## 2019-04-28 MED ORDER — LISINOPRIL 20 MG PO TABS: 20.0000 mg | ORAL_TABLET | Freq: Every day | ORAL | 0 refills | Status: DC

## 2019-04-28 MED ORDER — DOXYCYCLINE HYCLATE 100 MG PO TABS
100.0000 mg | ORAL_TABLET | Freq: Two times a day (BID) | ORAL | Status: DC
  Administered 2019-04-28: 08:00:00 100 mg via ORAL
  Filled 2019-04-28: qty 1

## 2019-04-28 MED ORDER — ACETAMINOPHEN 325 MG PO TABS
650.0000 mg | ORAL_TABLET | Freq: Four times a day (QID) | ORAL | Status: DC | PRN
  Administered 2019-04-28: 07:00:00 650 mg via ORAL
  Filled 2019-04-28: qty 2

## 2019-04-28 MED ORDER — ARIPIPRAZOLE 5 MG PO TABS
5.0000 mg | ORAL_TABLET | Freq: Once | ORAL | Status: AC
  Administered 2019-04-28: 5 mg via ORAL
  Filled 2019-04-28: qty 1

## 2019-04-28 MED ORDER — SERTRALINE HCL 25 MG PO TABS
25.0000 mg | ORAL_TABLET | Freq: Every day | ORAL | Status: DC
  Administered 2019-04-28: 25 mg via ORAL
  Filled 2019-04-28: qty 1

## 2019-04-28 MED ORDER — ARIPIPRAZOLE 5 MG PO TABS
5.0000 mg | ORAL_TABLET | Freq: Every day | ORAL | Status: DC
  Administered 2019-04-28: 08:00:00 5 mg via ORAL
  Filled 2019-04-28: qty 1

## 2019-04-28 MED ORDER — DOXYCYCLINE HYCLATE 100 MG PO CAPS: 100.0000 mg | ORAL_CAPSULE | Freq: Two times a day (BID) | ORAL | Status: DC

## 2019-04-28 MED ORDER — MAGNESIUM HYDROXIDE 400 MG/5ML PO SUSP: 30.0000 mL | Freq: Every day | ORAL | Status: DC | PRN

## 2019-04-28 MED ORDER — ARIPIPRAZOLE 10 MG PO TABS: 10.0000 mg | ORAL_TABLET | Freq: Every day | ORAL | Status: DC

## 2019-04-28 MED ORDER — ALUM & MAG HYDROXIDE-SIMETH 200-200-20 MG/5ML PO SUSP: 30.0000 mL | ORAL | Status: DC | PRN

## 2019-04-28 MED ORDER — ARIPIPRAZOLE 10 MG PO TABS: 10.0000 mg | ORAL_TABLET | Freq: Every day | ORAL | 0 refills | Status: DC

## 2019-04-28 MED ORDER — LISINOPRIL 20 MG PO TABS
20.0000 mg | ORAL_TABLET | Freq: Every day | ORAL | Status: DC
  Administered 2019-04-28: 20 mg via ORAL
  Filled 2019-04-28: qty 1

## 2019-04-28 MED ORDER — INSULIN ASPART 100 UNIT/ML ~~LOC~~ SOLN
0.0000 [IU] | Freq: Three times a day (TID) | SUBCUTANEOUS | Status: DC
  Administered 2019-04-28: 07:00:00 9 [IU] via SUBCUTANEOUS

## 2019-04-28 NOTE — Discharge Instructions (Signed)
For your behavioral health needs, you are advised to follow up with an outpatient provider.  You have indicated that you would like to be referred to an ACT Team, which may be beneficial to you.  An application has been submitted to [facility] on your behalf for ACT Team services.  If you do not hear from them this week, call them at your earliest opportunity:       [facility]      [address]      [place], [place] [address]      [phone]
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
## 2019-04-28 NOTE — Progress Notes (Signed)
Pt discharged to lobby. Pt was stable and appreciative at that time. All papers and prescriptions were given and valuables returned. Verbal understanding expressed. Denies SI/HI and A/VH. Pt given opportunity to express concerns and ask questions.  

## 2019-04-28 NOTE — Progress Notes (Signed)
Inpatient Diabetes Program Recommendations  AACE/ADA: New Consensus Statement on Inpatient Glycemic Control (2015)  Target Ranges:  Prepandial:   less than 140 mg/dL      Peak postprandial:   less than 180 mg/dL (1-2 hours)      Critically ill patients:  140 - 180 mg/dL   Lab Results  Component Value Date   GLUCAP 360 (H) 04/28/2019    Review of Glycemic Control  Diabetes history: DM 2 Outpatient Diabetes medications: Metformin 500 mg bid, Glipizide 5 mg Daily Current orders for Inpatient glycemic control:  Metformin 500 mg bid Novolog 0-9 units tid   Inpatient Diabetes Program Recommendations:    Glucose in the 300-400 range. Will need to be placed on more medications.  -Consider adding Glipizide 5 mg Daily  -Consider Increasing Novolog Correction to 0-15 units tid and add Novolog hs scale for bedtime coverage.  Thanks,  Tama Headings RN, MSN, BC-ADM Inpatient Diabetes Coordinator Team Pager [phone] (8a-5p)
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
## 2019-04-28 NOTE — Consult Note (Signed)
[facility] Psych observation discharge  04/28/2019 11:58 AM Katherine Franklin  MRN:  [id number] Principal Problem: Schizoaffective disorder [facility]) Discharge Diagnoses: Principal Problem:   Schizoaffective disorder (HCC) Active Problems:   Substance induced mood disorder (HCC)   Subjective: Patient is seen and examined.  Patient is a 53 year old female with a past psychiatric history significant for bipolar disorder who presented seeking medication refills as well as a referral to a ACTC service.  She was admitted to the observation unit, and restarted on her previous medications which included Zoloft, Abilify, Metformin.  She was monitored, and there is no evidence of any suicidal or homicidal ideation.  There was no evidence of any psychosis.  She was irritable, and review of her old chart showed that was one of her signs and symptoms.  We were attempting to get her plugged into a ACTC service at the local mental health center, but she wanted to leave the hospital approximately 1 hour after lunch to make sure that she would have a place to stay.  She was not homicidal, suicidal or psychotic.  Her blood sugar control was noted to be terrible.  We reinforced with her the need for medical as well as psychiatric follow-up.  It was decided that she could be discharged to home this date.  Total Time spent with patient: 30 minutes  Past Psychiatric History: See initial note  Past Medical History: See initial note Past Medical History:  Diagnosis Date  . Diabetes mellitus without complication (HCC)   . Drug abuse (HCC) 09/22/2018  . GERD (gastroesophageal reflux disease)   . Hypertension   . PTSD (post-traumatic stress disorder)   . Schizo affective schizophrenia [facility])     Past Surgical History:  Procedure Laterality Date  . SKIN GRAFT     Family History:  Family History  Family history unknown: Yes   Family Psychiatric  History: Noncontributory Social History:  Social History   Substance and  Sexual Activity  Alcohol Use Not Currently     Social History   Substance and Sexual Activity  Drug Use Yes  . Frequency: 7.0 times per week  . Types: Cocaine, "Crack" cocaine   Comment: Last used: Early this AM     Social History   Socioeconomic History  . Marital status: Single    Spouse name: Not on file  . Number of children: Not on file  . Years of education: Not on file  . Highest education level: Not on file  Occupational History  . Occupation: Prostitute  Tobacco Use  . Smoking status: Current Every Day Smoker    Packs/day: 2.00  . Smokeless tobacco: Never Used  Substance and Sexual Activity  . Alcohol use: Not Currently  . Drug use: Yes    Frequency: 7.0 times per week    Types: Cocaine, "Crack" cocaine    Comment: Last used: Early this AM   . Sexual activity: Yes  Other Topics Concern  . 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   Social Determinants of Health   Financial Resource Strain:   . Difficulty of Paying Living Expenses: Not on file  Food Insecurity:   . Worried About Programme researcher, broadcasting/film/video in the Last Year: Not on file  . Ran Out of Food in the Last Year: Not on file  Transportation Needs:   . Lack of Transportation (Medical): Not on file  .  Lack of Transportation (Non-Medical): Not on file  Physical Activity:   . Days of Exercise per Week: Not on file  . Minutes of Exercise per Session: Not on file  Stress:   . Feeling of Stress : Not on file  Social Connections:   . Frequency of Communication with Friends and Family: Not on file  . Frequency of Social Gatherings with Friends and Family: Not on file  . Attends Religious Services: Not on file  . Active Member of Clubs or Organizations: Not on file  . Attends BankerClub or Organization Meetings: Not on file  . Marital Status: Not on file    Has this patient used any form of tobacco in the last 30 days?  (Cigarettes, Smokeless Tobacco, Cigars, and/or Pipes) Prescription not provided because: Not interested at this point  Current Medications: Current Facility-Administered Medications  Medication Dose Route Frequency Provider Last Rate Last Admin  . acetaminophen (TYLENOL) tablet 650 mg  650 mg Oral Q6H PRN Nira ConnBerry, Jason A, NP   650 mg at 04/28/19 0630  . alum & mag hydroxide-simeth (MAALOX/MYLANTA) 200-200-20 MG/5ML suspension 30 mL  30 mL Oral Q4H PRN Jackelyn PolingBerry, Jason A, NP      . Melene Muller[START ON 04/29/2019] ARIPiprazole (ABILIFY) tablet 10 mg  10 mg Oral Daily Antonieta Pertlary, Latrisa Hellums Lawson, MD      . doxycycline (VIBRA-TABS) tablet 100 mg  100 mg Oral BID Nira ConnBerry, Jason A, NP   100 mg at 04/28/19 0756  . hydrOXYzine (ATARAX/VISTARIL) tablet 25 mg  25 mg Oral TID PRN Nira ConnBerry, Jason A, NP      . insulin aspart (novoLOG) injection 0-9 Units  0-9 Units Subcutaneous TID WC Nira ConnBerry, Jason A, NP   9 Units at 04/28/19 0644  . lisinopril (ZESTRIL) tablet 20 mg  20 mg Oral Daily Nira ConnBerry, Jason A, NP   20 mg at 04/28/19 0756  . magnesium hydroxide (MILK OF MAGNESIA) suspension 30 mL  30 mL Oral Daily PRN Nira ConnBerry, Jason A, NP      . metFORMIN (GLUCOPHAGE) tablet 500 mg  500 mg Oral BID WC Nira ConnBerry, Jason A, NP   500 mg at 04/28/19 0756  . sertraline (ZOLOFT) tablet 25 mg  25 mg Oral Daily Nira ConnBerry, Jason A, NP   25 mg at 04/28/19 [id number]   PTA Medications: Medications Prior to Admission  Medication Sig Dispense Refill Last Dose  . glipiZIDE (GLUCOTROL) 5 MG tablet Take 5 mg by mouth daily before breakfast.     . ARIPiprazole (ABILIFY) 5 MG tablet Take 1 tablet (5 mg total) by mouth daily. (Patient not taking: Reported on 04/08/2019) 30 tablet 0   . doxycycline (VIBRAMYCIN) 100 MG capsule Take 1 capsule (100 mg total) by mouth 2 (two) times daily. One po bid x 7 days 14 capsule 0   . lisinopril (ZESTRIL) 20 MG tablet Take 1 tablet (20 mg total) by mouth daily. (Patient not taking: Reported on 04/08/2019) 30 tablet 0   . metFORMIN (GLUCOPHAGE)  500 MG tablet Take 1 tablet (500 mg total) by mouth 2 (two) times daily with a meal. 60 tablet 0   . sertraline (ZOLOFT) 25 MG tablet Take 1 tablet (25 mg total) by mouth daily. (Patient not taking: Reported on 04/08/2019) 30 tablet 0     Musculoskeletal: Strength & Muscle Tone: within normal limits Gait & Station: normal Patient leans: N/A  Psychiatric Specialty Exam: Physical Exam Vitals and nursing note reviewed.  Constitutional:      Appearance: Normal appearance. She is  normal weight.  HENT:     Head: Normocephalic and atraumatic.  Pulmonary:     Effort: Pulmonary effort is normal.  Neurological:     General: No focal deficit present.     Mental Status: She is alert and oriented to person, place, and time. Mental status is at baseline.  Psychiatric:        Thought Content: Thought content normal.     Review of Systems  Blood pressure 124/80, pulse 86, temperature 98.1 F (36.7 C), temperature source Oral, resp. rate 20, height 5\' 9"  (1.753 m), weight 105.7 kg, SpO2 100 %.Body mass index is 34.41 kg/m.  General Appearance: Casual  Eye Contact:  Fair  Speech:  Normal Rate  Volume:  Increased  Mood:  Irritable  Affect:  Congruent  Thought Process:  Coherent and Descriptions of Associations: Circumstantial  Orientation:  Full (Time, Place, and Person)  Thought Content:  Logical  Suicidal Thoughts:  No  Homicidal Thoughts:  No  Memory:  Immediate;   Fair Recent;   Fair Remote;   Fair  Judgement:  Intact  Insight:  Lacking  Psychomotor Activity:  Increased  Concentration:  Concentration: Fair and Attention Span: Fair  Recall:  [employer] of Knowledge:  Fair  Language:  Fair  Akathisia:  Negative  Handed:  Right  AIMS (if indicated):     Assets:  Desire for Improvement Resilience  ADL's:  Intact  Cognition:  WNL  Sleep:        Demographic Factors:  Low socioeconomic status, Living alone and Unemployed  Loss Factors: Financial problems/change in  socioeconomic status  Historical Factors: Impulsivity  Risk Reduction Factors:   NA  Continued Clinical Symptoms:  Bipolar Disorder:   Mixed State Unstable or Poor Therapeutic Relationship Previous Psychiatric Diagnoses and Treatments Medical Diagnoses and Treatments/Surgeries  Cognitive Features That Contribute To Risk:  Thought constriction (tunnel vision)    Suicide Risk:  Minimal: No identifiable suicidal ideation.  Patients presenting with no risk factors but with morbid ruminations; may be classified as minimal risk based on the severity of the depressive symptoms    Plan Of Care/Follow-up recommendations:  Activity:  ad lib Diet:  diabetic  Disposition: To home Sharma Covert, MD 04/28/2019, 11:58 AM

## 2019-04-28 NOTE — BH Assessment (Addendum)
[facility] Assessment Progress Note  Per Myles Lipps, MD, this pt does not require psychiatric hospitalization at this time.  Pt is to be discharged from the [facility] Observation Unit with referral to an ACT Team.  This writer called [employer], pt's LME for Medicaid services, and ascertained that [employer] [facility] contracts with them for ACT Team services. An application has been submitted on pt's behalf, and contact information has been included in pt's discharge instructions.  Pt's nurse, Benjamine Mola, has been notified.  Jalene Mullet, MA Triage Specialist [phone]   Addendum:  Aforesaid application was faxed to [facility] on 04/28/2019, but they were not able to confirm receipt on that date.  At 12:50 today, 04/29/2019, this Probation officer called and spoke to Mickel Baas who confirms receipt.  Jalene Mullet, Felicity Coordinator [phone]

## 2019-04-28 NOTE — Plan of Care (Signed)
[facility] Observation Crisis Plan  Reason for Crisis Plan:  Crisis Stabilization, Medication Management and Substance Abuse   Plan of Care:  Referral for Substance Abuse  Family Support:      Current Living Environment:  Living Arrangements: Other (Comment)(homeless)  Insurance:   Hospital Account    Name Acct ID Class Status Primary Coverage   Katherine Franklin, Katherine Franklin [id number] Makena        Guarantor Account (for Hospital Account [id number])    Name Relation to Pt Service Area Active? Acct Type   Katherine Franklin Self [facility] Yes Behavioral Health   Address Phone       [address] [place], [place] [address] [phone](H)          Coverage Information (for Hospital Account [id number])    F/O Payor/Plan Precert #   [employer]    Subscriber Subscriber #   Katherine Franklin, Katherine Franklin [id number] [facility]   Address Phone   [address] [place], [place] [address] [phone]      Legal Guardian:  Legal Guardian: Other:(Self)  Primary Care Provider:  Patient, No Pcp Per  Current Outpatient Providers:  [facility]  Psychiatrist:  Name of Psychiatrist: None  Counselor/Therapist:  Name of Therapist: None  Compliant with Medications:  Yes  Additional Information:   Katherine Franklin 12/15/20207:02 AM

## 2019-04-28 NOTE — Progress Notes (Signed)
Katherine Franklin is a 53 y.o. female Voluntary admitted for anger and irritability. Pt stated she need help putting her thoughts together and getting back on her medications. Pt stated she is homeless and needing help finding a place to live. Pt also complained of pain on her feet due to diabetes neuropathy. Pt stated due to being homeless, she has been walking most of the time, that has not been helpful to her. Pt complained of spider bite on th right foot, there are some swelling and skin coming off.   Pt denies SI/HI, AVH and verbally contracted for safety. Consents signed, skin/belongings search completed and pt oriented to unit. Pt stable at this time. Pt given the opportunity to express concerns and ask questions. Pt given toiletries. Will continue to monitor.

## 2019-04-28 NOTE — BHH Suicide Risk Assessment (Signed)
[facility] Discharge Suicide Risk Assessment   Principal Problem: Schizoaffective disorder [facility]) Discharge Diagnoses: Principal Problem:   Schizoaffective disorder ([place]) Active Problems:   Substance induced mood disorder ([place])   Total Time spent with patient: 30 minutes  Musculoskeletal: Strength & Muscle Tone: within normal limits Gait & Station: normal Patient leans: N/A  Psychiatric Specialty Exam: Review of Systems  All other systems reviewed and are negative.   Blood pressure 124/80, pulse 86, temperature 98.1 F (36.7 C), temperature source Oral, resp. rate 20, height 5\' 9"  (1.753 m), weight 105.7 kg, SpO2 100 %.Body mass index is 34.41 kg/m.  General Appearance: Casual  Eye Contact::  Fair  Speech:  Normal Rate409  Volume:  Increased  Mood:  Irritable  Affect:  Congruent  Thought Process:  Coherent and Descriptions of Associations: Circumstantial  Orientation:  Full (Time, Place, and Person)  Thought Content:  Logical  Suicidal Thoughts:  No  Homicidal Thoughts:  No  Memory:  Immediate;   Fair Recent;   Fair Remote;   Fair  Judgement:  Intact  Insight:  Lacking  Psychomotor Activity:  Increased  Concentration:  Fair  Recall:  [employer] of Knowledge:Fair  Language: Good  Akathisia:  Negative  Handed:  Right  AIMS (if indicated):     Assets:  Desire for Improvement Resilience  Sleep:     Cognition: WNL  ADL's:  Intact   Mental Status Per Nursing Assessment::   On Admission:  NA  Demographic Factors:  Low socioeconomic status, Living alone and Unemployed  Loss Factors: Financial problems/change in socioeconomic status  Historical Factors: Impulsivity  Risk Reduction Factors:   NA  Continued Clinical Symptoms:  Bipolar Disorder:   Mixed State  Cognitive Features That Contribute To Risk:  Closed-mindedness    Suicide Risk:  Minimal: No identifiable suicidal ideation.  Patients presenting with no risk factors but with morbid ruminations; may be  classified as minimal risk based on the severity of the depressive symptoms    Plan Of Care/Follow-up recommendations:  Activity:  ad lib   Follow diabetic diet  Sharma Covert, MD 04/28/2019, 11:55 AM

## 2019-04-28 NOTE — Progress Notes (Signed)
   04/28/19 0800  Psych Admission Type (Psych Patients Only)  Admission Status Voluntary  Psychosocial Assessment  Patient Complaints Anger;Anxiety;Irritability  Eye Contact Fair  Facial Expression Anxious;Animated  Affect Anxious  Speech Aggressive  Interaction Dominating  Motor Activity Restless  Appearance/Hygiene Unremarkable  Behavior Characteristics Irritable  Mood Irritable;Angry  Thought Process  Coherency Disorganized  Content Blaming others  Delusions Grandeur  Perception Illusions  Hallucination None reported or observed  Judgment Poor  Confusion None  Danger to Self  Current suicidal ideation? Denies  Danger to Others  Danger to Others None reported or observed
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
## 2019-04-30 ENCOUNTER — Emergency Department (HOSPITAL_COMMUNITY)
Admission: EM | Admit: 2019-04-30 | Discharge: 2019-04-30 | Disposition: A | Discharge status: Home / Self Care | Payer: Medicaid Other | Attending: Emergency Medicine | Admitting: Emergency Medicine

## 2019-04-30 ENCOUNTER — Other Ambulatory Visit: Payer: Self-pay

## 2019-04-30 ENCOUNTER — Encounter (HOSPITAL_COMMUNITY): Payer: Self-pay

## 2019-04-30 DIAGNOSIS — E1065 Type 1 diabetes mellitus with hyperglycemia: Secondary | ICD-10-CM | POA: Insufficient documentation

## 2019-04-30 DIAGNOSIS — Z5321 Procedure and treatment not carried out due to patient leaving prior to being seen by health care provider: Secondary | ICD-10-CM | POA: Diagnosis not present

## 2019-04-30 DIAGNOSIS — N939 Abnormal uterine and vaginal bleeding, unspecified: Secondary | ICD-10-CM | POA: Insufficient documentation

## 2019-04-30 DIAGNOSIS — R45851 Suicidal ideations: Secondary | ICD-10-CM | POA: Insufficient documentation

## 2019-04-30 LAB — CBG MONITORING, ED: Glucose-Capillary: 456 mg/dL — ABNORMAL HIGH (ref 70–99)

## 2019-04-30 NOTE — ED Provider Notes (Cosign Needed Addendum)
Patient presenting with multiple complaints including vaginal bleeding, hyperglycemia, pain and blistering to her right foot, SI and HI.  She reportedly used crack cocaine prior to arrival.  She reports itching in her vagina to the point of bleeding.  She believes she may have a yeast infection. She reports she has been suicidal and homicidal since she was a kid.  She is not currently on any medications for any of the above and states she cannot get disability and thus, she is not on medications. Patient will need labs, pelvic exam, TTS evaluation.  MSE was initiated and I personally evaluated the patient and placed orders (if any) at  10:52 PM on April 30, 2019.  The patient appears stable so that the remainder of the MSE may be completed by another provider.    Frederica Kuster, PA-C 04/30/19 2309

## 2019-04-30 NOTE — ED Notes (Signed)
Patient refused lab draw at this time.  

## 2019-04-30 NOTE — ED Triage Notes (Signed)
Pt BIB [facility] reporting chronic SI and nonmedicated diabetes. CBG 423. Reports cocaine use about an hour ago.

## 2019-04-30 NOTE — ED Notes (Signed)
Pt ambulated out of the ED. She denies SI as she is leaving. She states that we are doing nothing for her, she just wants "cream for her ass." I asked if she was choosing to leave ans she states, "is an elephant heavy" as walking out the door.

## 2019-04-30 NOTE — ED Notes (Signed)
Pt walked out of the room with a bloody washcloth yelling that this is more than a period. Pt is loudly screaming.

## 2019-06-04 ENCOUNTER — Emergency Department (HOSPITAL_COMMUNITY)
Admission: EM | Admit: 2019-06-04 | Discharge: 2019-06-05 | Disposition: A | Discharge status: Home / Self Care | Payer: Medicaid Other | Attending: Emergency Medicine | Admitting: Emergency Medicine

## 2019-06-04 ENCOUNTER — Encounter (HOSPITAL_COMMUNITY): Payer: Self-pay | Admitting: Emergency Medicine

## 2019-06-04 DIAGNOSIS — R45851 Suicidal ideations: Secondary | ICD-10-CM | POA: Diagnosis present

## 2019-06-04 DIAGNOSIS — Z20822 Contact with and (suspected) exposure to covid-19: Secondary | ICD-10-CM | POA: Insufficient documentation

## 2019-06-04 DIAGNOSIS — E119 Type 2 diabetes mellitus without complications: Secondary | ICD-10-CM | POA: Diagnosis not present

## 2019-06-04 DIAGNOSIS — F1910 Other psychoactive substance abuse, uncomplicated: Secondary | ICD-10-CM

## 2019-06-04 DIAGNOSIS — F1990 Other psychoactive substance use, unspecified, uncomplicated: Secondary | ICD-10-CM | POA: Insufficient documentation

## 2019-06-04 DIAGNOSIS — I10 Essential (primary) hypertension: Secondary | ICD-10-CM | POA: Diagnosis not present

## 2019-06-04 DIAGNOSIS — Z7984 Long term (current) use of oral hypoglycemic drugs: Secondary | ICD-10-CM | POA: Insufficient documentation

## 2019-06-04 DIAGNOSIS — F17210 Nicotine dependence, cigarettes, uncomplicated: Secondary | ICD-10-CM | POA: Diagnosis not present

## 2019-06-04 DIAGNOSIS — Z590 Homelessness: Secondary | ICD-10-CM | POA: Diagnosis not present

## 2019-06-04 DIAGNOSIS — F32A Depression, unspecified: Secondary | ICD-10-CM

## 2019-06-04 DIAGNOSIS — F250 Schizoaffective disorder, bipolar type: Secondary | ICD-10-CM | POA: Diagnosis not present

## 2019-06-04 DIAGNOSIS — Z91040 Latex allergy status: Secondary | ICD-10-CM | POA: Diagnosis not present

## 2019-06-04 DIAGNOSIS — F329 Major depressive disorder, single episode, unspecified: Secondary | ICD-10-CM

## 2019-06-04 LAB — COMPREHENSIVE METABOLIC PANEL
ALT: 12 U/L (ref 0–44)
AST: 11 U/L — ABNORMAL LOW (ref 15–41)
Albumin: 3.3 g/dL — ABNORMAL LOW (ref 3.5–5.0)
Alkaline Phosphatase: 73 U/L (ref 38–126)
Anion gap: 12 (ref 5–15)
BUN: 10 mg/dL (ref 6–20)
CO2: 26 mmol/L (ref 22–32)
Calcium: 8.9 mg/dL (ref 8.9–10.3)
Chloride: 100 mmol/L (ref 98–111)
Creatinine, Ser: 0.82 mg/dL (ref 0.44–1.00)
GFR calc Af Amer: >60 mL/min (ref >60)
GFR calc non Af Amer: >60 mL/min (ref >60)
Glucose, Bld: 351 mg/dL — ABNORMAL HIGH (ref 70–99)
Potassium: 3.5 mmol/L (ref 3.5–5.1)
Sodium: 138 mmol/L (ref 135–145)
Total Bilirubin: 0.6 mg/dL (ref 0.3–1.2)
Total Protein: 6.5 g/dL (ref 6.5–8.1)

## 2019-06-04 LAB — CBC WITH DIFFERENTIAL/PLATELET
Abs Immature Granulocytes: 0 10*3/uL (ref 0.00–0.07)
Basophils Absolute: 0 10*3/uL (ref 0.0–0.1)
Basophils Relative: 1 %
Eosinophils Absolute: 0.2 10*3/uL (ref 0.0–0.5)
Eosinophils Relative: 4 %
HCT: 35.4 % — ABNORMAL LOW (ref 36.0–46.0)
Hemoglobin: 10.9 g/dL — ABNORMAL LOW (ref 12.0–15.0)
Immature Granulocytes: 0 %
Lymphocytes Relative: 46 %
Lymphs Abs: 2.4 10*3/uL (ref 0.7–4.0)
MCH: 26.2 pg (ref 26.0–34.0)
MCHC: 30.8 g/dL (ref 30.0–36.0)
MCV: 85.1 fL (ref 80.0–100.0)
Monocytes Absolute: 0.5 10*3/uL (ref 0.1–1.0)
Monocytes Relative: 10 %
Neutro Abs: 2 10*3/uL (ref 1.7–7.7)
Neutrophils Relative %: 39 %
Platelets: 264 10*3/uL (ref 150–400)
RBC: 4.16 MIL/uL (ref 3.87–5.11)
RDW: 13.7 % (ref 11.5–15.5)
WBC: 5.1 10*3/uL (ref 4.0–10.5)
nRBC: 0 % (ref 0.0–0.2)

## 2019-06-04 LAB — RESPIRATORY PANEL BY RT PCR (FLU A&B, COVID)
Influenza A by PCR: NEGATIVE
Influenza B by PCR: NEGATIVE
SARS Coronavirus 2 by RT PCR: NEGATIVE

## 2019-06-04 LAB — ETHANOL: Alcohol, Ethyl (B): <10 mg/dL (ref <10)

## 2019-06-04 LAB — RAPID URINE DRUG SCREEN, HOSP PERFORMED
Amphetamines: NOT DETECTED
Barbiturates: NOT DETECTED
Benzodiazepines: NOT DETECTED
Cocaine: POSITIVE — AB
Opiates: NOT DETECTED
Tetrahydrocannabinol: NOT DETECTED

## 2019-06-04 LAB — CBG MONITORING, ED: Glucose-Capillary: 302 mg/dL — ABNORMAL HIGH (ref 70–99)

## 2019-06-04 MED ORDER — ALUM & MAG HYDROXIDE-SIMETH 200-200-20 MG/5ML PO SUSP
30.0000 mL | Freq: Four times a day (QID) | ORAL | Status: DC | PRN
  Administered 2019-06-05: 30 mL via ORAL
  Filled 2019-06-04: qty 30

## 2019-06-04 MED ORDER — METFORMIN HCL 500 MG PO TABS
500.0000 mg | ORAL_TABLET | Freq: Two times a day (BID) | ORAL | Status: DC
  Administered 2019-06-05: 500 mg via ORAL
  Filled 2019-06-04: qty 1

## 2019-06-04 MED ORDER — SERTRALINE HCL 50 MG PO TABS
25.0000 mg | ORAL_TABLET | Freq: Every day | ORAL | Status: DC
  Administered 2019-06-04 – 2019-06-05 (×2): 25 mg via ORAL
  Filled 2019-06-04 (×2): qty 1

## 2019-06-04 MED ORDER — ONDANSETRON HCL 4 MG PO TABS
4.0000 mg | ORAL_TABLET | Freq: Three times a day (TID) | ORAL | Status: DC | PRN
  Administered 2019-06-05: 4 mg via ORAL
  Filled 2019-06-04: qty 1

## 2019-06-04 MED ORDER — INSULIN ASPART 100 UNIT/ML ~~LOC~~ SOLN
10.0000 [IU] | Freq: Once | SUBCUTANEOUS | Status: AC
  Administered 2019-06-04: 10 [IU] via SUBCUTANEOUS
  Filled 2019-06-04: qty 0.1

## 2019-06-04 MED ORDER — ACETAMINOPHEN 325 MG PO TABS
650.0000 mg | ORAL_TABLET | ORAL | Status: DC | PRN
  Administered 2019-06-05: 650 mg via ORAL
  Filled 2019-06-04: qty 2

## 2019-06-04 MED ORDER — ARIPIPRAZOLE 10 MG PO TABS
10.0000 mg | ORAL_TABLET | Freq: Every day | ORAL | Status: DC
  Administered 2019-06-04 – 2019-06-05 (×2): 10 mg via ORAL
  Filled 2019-06-04 (×2): qty 1

## 2019-06-04 NOTE — ED Provider Notes (Addendum)
[place] DEPT Provider Note   CSN: [id number] Arrival date & time: 06/04/19  1904     History Chief Complaint  Patient presents with  . Medical Clearance    drug use and homeless SI HI verbalizes     Katherine Franklin is a 54 y.o. female.  The history is provided by the patient. No language interpreter was used.  Depression This is a recurrent problem. The current episode started more than 1 week ago. The problem occurs constantly. The problem has been gradually worsening. Pertinent negatives include no shortness of breath. Nothing aggravates the symptoms. Nothing relieves the symptoms. She has tried nothing for the symptoms. The treatment provided no relief.   Pt reports she has been using crack tonight.  Pt reports she has been having problems with depression and suicidal thoughts since she was 54 years old.  Pt reports she is homeless.  She had an ACT team in the past but she lost when she moved to [place].  Pt lives here now.  Pt reports she wants to get back with Act.  Pt complains of feeling suicidal.  Pt reports she is just tired.     Past Medical History:  Diagnosis Date  . Diabetes mellitus without complication ([place])   . Drug abuse ([place]) 09/22/2018  . GERD (gastroesophageal reflux disease)   . Hypertension   . PTSD (post-traumatic stress disorder)   . Schizo affective schizophrenia [facility])     Patient Active Problem List   Diagnosis Date Noted  . Schizoaffective disorder ([place]) 04/28/2019  . Cellulitis 04/19/2019  . Substance induced mood disorder ([place]) 04/09/2019  . Homelessness 04/09/2019  . History of posttraumatic stress disorder (PTSD) 04/09/2019  . Cocaine dependence ([place]) 08/18/2018  . Major depressive disorder, recurrent severe without psychotic features ([place]) 08/18/2018    Past Surgical History:  Procedure Laterality Date  . SKIN GRAFT       OB History   No obstetric history on file.     Family History  Family history  unknown: Yes    Social History   Tobacco Use  . Smoking status: Current Every Day Smoker    Packs/day: 2.00  . Smokeless tobacco: Never Used  Substance Use Topics  . Alcohol use: Not Currently  . Drug use: Yes    Frequency: 7.0 times per week    Types: Cocaine, "Crack" cocaine    Comment: Last used: Early this AM     Home Medications Prior to Admission medications   Medication Sig Start Date End Date Taking? Authorizing Provider  ARIPiprazole (ABILIFY) 10 MG tablet Take 1 tablet (10 mg total) by mouth daily. 04/29/19   Sharma Covert, MD  doxycycline (VIBRAMYCIN) 100 MG capsule Take 1 capsule (100 mg total) by mouth 2 (two) times daily. One po bid x 7 days 04/26/19   Deno Etienne, DO  glipiZIDE (GLUCOTROL) 5 MG tablet Take 5 mg by mouth daily before breakfast.    [provider]  lisinopril (ZESTRIL) 20 MG tablet Take 1 tablet (20 mg total) by mouth daily. 04/28/19   Sharma Covert, MD  metFORMIN (GLUCOPHAGE) 500 MG tablet Take 1 tablet (500 mg total) by mouth 2 (two) times daily with a meal. 04/26/19   Deno Etienne, DO  metFORMIN (GLUCOPHAGE) 500 MG tablet Take 1 tablet (500 mg total) by mouth 2 (two) times daily with a meal. 04/28/19   Sharma Covert, MD  sertraline (ZOLOFT) 25 MG tablet Take 1 tablet (25 mg total)  by mouth daily. 04/28/19   Antonieta Pert, MD    Allergies    Sulfa antibiotics and Latex  Review of Systems   Review of Systems  Respiratory: Negative for shortness of breath.   Psychiatric/Behavioral: Positive for depression.  All other systems reviewed and are negative.   Physical Exam Updated Vital Signs There were no vitals taken for this visit.  Physical Exam Vitals and nursing note reviewed.  Constitutional:      Appearance: She is well-developed.  HENT:     Head: Normocephalic.     Mouth/Throat:     Mouth: Mucous membranes are moist.  Eyes:     Pupils: Pupils are equal, round, and reactive to light.  Cardiovascular:      Rate and Rhythm: Normal rate.  Pulmonary:     Effort: Pulmonary effort is normal.  Abdominal:     General: There is no distension.  Musculoskeletal:        General: Normal range of motion.     Cervical back: Normal range of motion.  Skin:    General: Skin is warm.  Neurological:     General: No focal deficit present.     Mental Status: She is alert and oriented to person, place, and time.  Psychiatric:        Mood and Affect: Mood normal.     ED Results / Procedures / Treatments   Labs (all labs ordered are listed, but only abnormal results are displayed) Labs Reviewed  RESPIRATORY PANEL BY RT PCR (FLU A&B, COVID)  COMPREHENSIVE METABOLIC PANEL  ETHANOL  RAPID URINE DRUG SCREEN, HOSP PERFORMED  CBC WITH DIFFERENTIAL/PLATELET    EKG None  Radiology No results found.  Procedures Procedures (including critical care time)  Medications Ordered in ED Medications - No data to display  ED Course  I have reviewed the triage vital signs and the nursing notes.  Pertinent labs & imaging results that were available during my care of the patient were reviewed by me and considered in my medical decision making (see chart for details).    MDM Rules/Calculators/A&P                      MDM  TTs consult ordere.  They advised hold pt and reasses in am.  Pt's care turned over to Dr. Denton Lank  9pm Final Clinical Impression(s) / ED Diagnoses Final diagnoses:  Depression, unspecified depression type  Suicidal ideation  Substance abuse [facility])    Rx / DC Orders ED Discharge Orders    None     Osie Cheeks 06/04/19 2003    Marykate Heuberger K, PA-C 06/04/19 2022    Osie Cheeks 06/04/19 2058    Cathren Laine, MD 06/05/19 [phone]

## 2019-06-04 NOTE — ED Triage Notes (Signed)
Pt comes to ed via ems, called out at 6:30pm  Pt is homeless and asking for help, took a "bump of cocaine" and feels like she is wanting to harms self, asks if she can go to [facility] for help. V/s on arrival 162/90, pluse 105, spo2 98 on room air, cbg 359, temp 98.1.  Alert x 4. Walks.

## 2019-06-04 NOTE — Progress Notes (Signed)
Renaye Rakers, NP recommends continued observation for safety and stabilization and to be reassessed in the AM by psych. EDP Elson Areas, PA-C and pt's nurse Shellia Carwin, RN have been advised. AC to review for possible BH OBS bed pending medical clearance and negative Covid test.  Princess Bruins, MSW, LCSW Therapeutic Triage Specialist  [phone]
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
## 2019-06-05 LAB — CBG MONITORING, ED
Glucose-Capillary: 244 mg/dL — ABNORMAL HIGH (ref 70–99)
Glucose-Capillary: 293 mg/dL — ABNORMAL HIGH (ref 70–99)

## 2019-06-05 MED ORDER — ARIPIPRAZOLE 10 MG PO TABS: 10.0000 mg | ORAL_TABLET | Freq: Every day | ORAL | 0 refills | Status: DC

## 2019-06-05 MED ORDER — BENZTROPINE MESYLATE 1 MG PO TABS: 1.0000 mg | ORAL_TABLET | Freq: Every day | ORAL | 0 refills | Status: DC

## 2019-06-05 MED ORDER — HALOPERIDOL LACTATE 5 MG/ML IJ SOLN: 5.0000 mg | Freq: Once | INTRAMUSCULAR | Status: DC

## 2019-06-05 MED ORDER — SERTRALINE HCL 25 MG PO TABS: 25.0000 mg | ORAL_TABLET | Freq: Every day | ORAL | 0 refills | Status: DC

## 2019-06-05 NOTE — Discharge Instructions (Signed)
For your behavioral health needs, you are advised to follow up with an outpatient provider.  You may be eligible for ACT Team services.  Contact [facility] to ask about enrolling in their ACT Team program:       [facility]      [address]      [place], [place] [address]      [phone]

## 2019-06-05 NOTE — BH Assessment (Signed)
[facility] Assessment Progress Note  Per Caryn Bee, PMHNP, this pt does not require psychiatric hospitalization at this time.  Pt is to be discharged from [facility]  Pt had departed from [facility] before discharge instructions were entered.  In the recent past pt had been referred to [facility] for ACT Team services, as supported by [employer], pt's LME.  A recommendation to follow up with them has been added to pt's discharge instructions in case she presents at the ED again in the immediate future.  Pt's nurse, Addison Naegeli, has been notified.  Doylene Canning, MA Triage Specialist [phone]

## 2019-06-05 NOTE — Progress Notes (Signed)
06/05/2019  0946  Patient received envelope from being locked up with security.

## 2019-06-06 ENCOUNTER — Other Ambulatory Visit: Payer: Self-pay

## 2019-06-06 ENCOUNTER — Emergency Department (HOSPITAL_COMMUNITY)
Admission: EM | Admit: 2019-06-06 | Discharge: 2019-06-06 | Disposition: A | Discharge status: Home / Self Care | Payer: Medicaid Other | Attending: Emergency Medicine | Admitting: Emergency Medicine

## 2019-06-06 DIAGNOSIS — Z590 Homelessness: Secondary | ICD-10-CM | POA: Diagnosis not present

## 2019-06-06 DIAGNOSIS — F1410 Cocaine abuse, uncomplicated: Secondary | ICD-10-CM | POA: Insufficient documentation

## 2019-06-06 DIAGNOSIS — R519 Headache, unspecified: Secondary | ICD-10-CM | POA: Diagnosis not present

## 2019-06-06 DIAGNOSIS — F259 Schizoaffective disorder, unspecified: Secondary | ICD-10-CM | POA: Insufficient documentation

## 2019-06-06 DIAGNOSIS — Z7151 Drug abuse counseling and surveillance of drug abuser: Secondary | ICD-10-CM | POA: Diagnosis present

## 2019-06-06 DIAGNOSIS — I10 Essential (primary) hypertension: Secondary | ICD-10-CM | POA: Diagnosis not present

## 2019-06-06 DIAGNOSIS — F17200 Nicotine dependence, unspecified, uncomplicated: Secondary | ICD-10-CM | POA: Insufficient documentation

## 2019-06-06 DIAGNOSIS — E1140 Type 2 diabetes mellitus with diabetic neuropathy, unspecified: Secondary | ICD-10-CM | POA: Diagnosis not present

## 2019-06-06 LAB — CBG MONITORING, ED: Glucose-Capillary: 280 mg/dL — ABNORMAL HIGH (ref 70–99)

## 2019-06-06 MED ORDER — ACETAMINOPHEN 500 MG PO TABS
1000.0000 mg | ORAL_TABLET | Freq: Once | ORAL | Status: AC
  Administered 2019-06-06: 1000 mg via ORAL
  Filled 2019-06-06: qty 2

## 2019-06-06 NOTE — ED Triage Notes (Signed)
Pt presents to ED via [facility] from off the streets. Pt requests help with her cocaine use and c/o a HA. Admits to cocaine use around 0440 this morning. Pt is CA&Ox4. Pt VS WNL at this time.

## 2019-06-06 NOTE — ED Provider Notes (Signed)
[facility] Emergency Department Provider Note MRN:  [id number]  Arrival date & time: 06/06/19     Chief Complaint   Addiction Problem and Mental Health Problem   History of Present Illness   Katherine Franklin is a 54 y.o. year-old female with a history of diabetes, schizoaffective disorder, hypertension presenting to the ED with chief complaint of addiction problem.  Patient explains that she has been addicted to crack for the past 30 years.  She does things that she would not normally do in order to obtain money for crack.  She is tired of being addicted to crack and wants help.  She denies suicidal ideation, no homicidal ideation, no AVH.  She denies physical or sexual abuse this evening.  She is endorsing a dull frontal headache and neuropathy pain in her feet which is not abnormal.  Denies fever, cough, no other complaints.  Used cocaine shortly prior to arrival.  Review of Systems  A complete 10 system review of systems was obtained and all systems are negative except as noted in the HPI and PMH.   Patient's Health History    Past Medical History:  Diagnosis Date  . Diabetes mellitus without complication (HCC)   . Drug abuse (HCC) 09/22/2018  . GERD (gastroesophageal reflux disease)   . Hypertension   . PTSD (post-traumatic stress disorder)   . Schizo affective schizophrenia [facility])     Past Surgical History:  Procedure Laterality Date  . SKIN GRAFT      Family History  Family history unknown: Yes    Social History   Socioeconomic History  . Marital status: Single    Spouse name: Not on file  . Number of children: Not on file  . Years of education: Not on file  . Highest education level: Not on file  Occupational History  . Occupation: Prostitute  Tobacco Use  . Smoking status: Current Every Day Smoker    Packs/day: 2.00  . Smokeless tobacco: Never Used  Substance and Sexual Activity  . Alcohol use: Not Currently  . Drug use: Yes   Frequency: 7.0 times per week    Types: Cocaine, "Crack" cocaine    Comment: Last used: Early this AM   . Sexual activity: Yes  Other Topics Concern  . 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   Social Determinants of Health   Financial Resource Strain:   . Difficulty of Paying Living Expenses: Not on file  Food Insecurity:   . Worried About Programme researcher, broadcasting/film/video in the Last Year: Not on file  . Ran Out of Food in the Last Year: Not on file  Transportation Needs:   . Lack of Transportation (Medical): Not on file  . Lack of Transportation (Non-Medical): Not on file  Physical Activity:   . Days of Exercise per Week: Not on file  . Minutes of Exercise per Session: Not on file  Stress:   . Feeling of Stress : Not on file  Social Connections:   . Frequency of Communication with Friends and Family: Not on file  . Frequency of Social Gatherings with Friends and Family: Not on file  . Attends Religious Services: Not on file  . Active Member of Clubs or Organizations: Not on file  . Attends Banker Meetings: Not on file  . Marital Status: Not on file  Intimate Partner Violence:   .  Fear of Current or Ex-Partner: Not on file  . Emotionally Abused: Not on file  . Physically Abused: Not on file  . Sexually Abused: Not on file     Physical Exam   Vitals:   06/06/19 0529  BP: (!) 158/90  Pulse: 98  Resp: 16  Temp: 98.1 F (36.7 C)  SpO2: 98%    CONSTITUTIONAL: Chronically ill-appearing, NAD NEURO:  Alert and oriented x 3, no focal deficits EYES:  eyes equal and reactive ENT/NECK:  no LAD, no JVD CARDIO: Regular rate, well-perfused, normal S1 and S2 PULM:  CTAB no wheezing or rhonchi GI/GU:  normal bowel sounds, non-distended, non-tender MSK/SPINE:  No gross deformities, no edema SKIN:  no rash, atraumatic PSYCH:  Appropriate speech and behavior  *Additional and/or  pertinent findings included in MDM below  Diagnostic and Interventional Summary    EKG Interpretation  Date/Time:    Ventricular Rate:    PR Interval:    QRS Duration:   QT Interval:    QTC Calculation:   R Axis:     Text Interpretation:        Labs Reviewed  CBG MONITORING, ED - Abnormal; Notable for the following components:      Result Value   Glucose-Capillary 280 (*)    All other components within normal limits    No orders to display    Medications  acetaminophen (TYLENOL) tablet 1,000 mg (1,000 mg Oral Given 06/06/19 0608)     Procedures  /  Critical Care Procedures  ED Course and Medical Decision Making  I have reviewed the triage vital signs, the nursing notes, and pertinent available records from the EMR.  Pertinent labs & imaging results that were available during my care of the patient were reviewed by me and considered in my medical decision making (see below for details).     Patient wanting help getting access to rehab.  States that she has been here before and has been given phone numbers but it is difficult for her to use someone else's phone.  She does not appear to be a direct threat of harm to herself or others.  I believe she is both medically and psychiatrically cleared and does not need formal psychiatry evaluation given that she denies SI, HI.  She would benefit most from peer support.  Discharge thereafter.  Peers support not available on the weekend.  Upon discussion of discharge, patient began changing her tone and talking about "going to do something to get into jail" or to "kill the next trick that tries something".  She has a well-documented history of malingering in order to continue her ED stays.  She previously denied any suicidal ideation or homicidal ideation, I do not feel she is unstable psychiatrically, I do feel that she needs help but not in an emergent setting.  Appropriate for discharge.  Elmer Sow. Pilar Plate, MD [facility] Emergency  Medicine [facility] [email or web address]  Final Clinical Impressions(s) / ED Diagnoses     ICD-10-CM   1. Cocaine abuse (HCC)  F14.10   2. Schizoaffective disorder, unspecified type (HCC)  F25.9     ED Discharge Orders    None       Discharge Instructions Discussed with and Provided to Patient:     Discharge Instructions     You were evaluated in the Emergency Department and after careful evaluation, we did not find any emergent condition requiring admission or further testing in the hospital.  Your exam/testing today  is overall reassuring.  Please return to the Emergency Department if you experience any worsening of your condition.  We encourage you to follow up with a primary care provider.  Thank you for allowing [place] to be a part of your care.       Maudie Flakes, MD 06/06/19 [phone]

## 2019-06-06 NOTE — Discharge Instructions (Addendum)
You were evaluated in the Emergency Department and after careful evaluation, we did not find any emergent condition requiring admission or further testing in the hospital.  Your exam/testing today is overall reassuring.  Please return to the Emergency Department if you experience any worsening of your condition.  We encourage you to follow up with a primary care provider.  Thank you for allowing us to be a part of your care. 

## 2019-06-06 NOTE — ED Notes (Signed)
Pt walked out angry,

## 2019-06-10 ENCOUNTER — Emergency Department (HOSPITAL_COMMUNITY)
Admission: EM | Admit: 2019-06-10 | Discharge: 2019-06-10 | Discharge status: Against Medical Advice | Payer: Medicaid Other | Source: Home / Self Care | Attending: Emergency Medicine | Admitting: Emergency Medicine

## 2019-06-10 ENCOUNTER — Emergency Department (HOSPITAL_COMMUNITY)
Admission: EM | Admit: 2019-06-10 | Discharge: 2019-06-10 | Payer: Medicaid Other | Attending: Emergency Medicine | Admitting: Emergency Medicine

## 2019-06-10 ENCOUNTER — Encounter (HOSPITAL_COMMUNITY): Payer: Self-pay | Admitting: Emergency Medicine

## 2019-06-10 ENCOUNTER — Other Ambulatory Visit: Payer: Self-pay

## 2019-06-10 DIAGNOSIS — Z5900 Homelessness unspecified: Secondary | ICD-10-CM

## 2019-06-10 DIAGNOSIS — F1410 Cocaine abuse, uncomplicated: Secondary | ICD-10-CM | POA: Insufficient documentation

## 2019-06-10 DIAGNOSIS — Z91040 Latex allergy status: Secondary | ICD-10-CM | POA: Diagnosis not present

## 2019-06-10 DIAGNOSIS — F17210 Nicotine dependence, cigarettes, uncomplicated: Secondary | ICD-10-CM | POA: Insufficient documentation

## 2019-06-10 DIAGNOSIS — I10 Essential (primary) hypertension: Secondary | ICD-10-CM | POA: Insufficient documentation

## 2019-06-10 DIAGNOSIS — E119 Type 2 diabetes mellitus without complications: Secondary | ICD-10-CM | POA: Insufficient documentation

## 2019-06-10 DIAGNOSIS — Z79899 Other long term (current) drug therapy: Secondary | ICD-10-CM | POA: Insufficient documentation

## 2019-06-10 DIAGNOSIS — R4689 Other symptoms and signs involving appearance and behavior: Secondary | ICD-10-CM | POA: Diagnosis not present

## 2019-06-10 MED ORDER — ZIPRASIDONE MESYLATE 20 MG IM SOLR: 20.0000 mg | Freq: Once | INTRAMUSCULAR | Status: DC

## 2019-06-10 NOTE — ED Triage Notes (Signed)
Patient naked around emergency room. Possible cocaine use

## 2019-06-10 NOTE — ED Notes (Signed)
Patient refusing to have CBG taken, patient states:  you are not making money off of me, fixing my blood sugar isn't going to fix my problems, I am still going to be out on the street. Get me my discharge papers. Patient refusing all blood/lab tests MD made aware

## 2019-06-10 NOTE — ED Provider Notes (Signed)
[facility]-EMERGENCY DEPT Provider Note   CSN: [id number] Arrival date & time: 06/10/19  5462   History Chief Complaint  Patient presents with  . Medical Clearance    Katherine Franklin is a 54 y.o. female.  The history is provided by the patient.  She has history of hypertension, diabetes, drug abuse, schizophrenia and was just in the ED and taken to jail and police have returned her requesting involuntary commitment.  Patient is now much more cooperative and her main issue seems to be homelessness.  She is complaining that she has money that is owed to her which is not getting to her.  She also states that she does not want to talk with social workers because they just give her a list of resources which she cannot act on.  She will not tell me if she has used any drugs but continues to deny homicidal and suicidal ideation.  Past Medical History:  Diagnosis Date  . Diabetes mellitus without complication (HCC)   . Drug abuse (HCC) 09/22/2018  . GERD (gastroesophageal reflux disease)   . Hypertension   . PTSD (post-traumatic stress disorder)   . Schizo affective schizophrenia [facility])     Patient Active Problem List   Diagnosis Date Noted  . Schizoaffective disorder (HCC) 04/28/2019  . Cellulitis 04/19/2019  . Substance induced mood disorder (HCC) 04/09/2019  . Homelessness 04/09/2019  . History of posttraumatic stress disorder (PTSD) 04/09/2019  . Cocaine dependence (HCC) 08/18/2018  . Major depressive disorder, recurrent severe without psychotic features (HCC) 08/18/2018    Past Surgical History:  Procedure Laterality Date  . SKIN GRAFT       OB History   No obstetric history on file.     Family History  Family history unknown: Yes    Social History   Tobacco Use  . Smoking status: Current Every Day Smoker    Packs/day: 2.00  . Smokeless tobacco: Never Used  Substance Use Topics  . Alcohol use: Not Currently  . Drug use: Yes    Frequency:  7.0 times per week    Types: Cocaine, "Crack" cocaine    Comment: Last used: Early this AM     Home Medications Prior to Admission medications   Medication Sig Start Date End Date Taking? Authorizing Provider  ARIPiprazole (ABILIFY) 10 MG tablet Take 1 tablet (10 mg total) by mouth daily. 06/05/19   Starkes-Perry, Juel Burrow, FNP  benztropine (COGENTIN) 1 MG tablet Take 1 tablet (1 mg total) by mouth daily. 06/05/19 07/05/19  Starkes-Perry, Juel Burrow, FNP  lisinopril (ZESTRIL) 20 MG tablet Take 1 tablet (20 mg total) by mouth daily. 04/28/19   Antonieta Pert, MD  metFORMIN (GLUCOPHAGE) 500 MG tablet Take 1 tablet (500 mg total) by mouth 2 (two) times daily with a meal. Patient not taking: Reported on 06/04/2019 04/28/19   Antonieta Pert, MD  sertraline (ZOLOFT) 25 MG tablet Take 1 tablet (25 mg total) by mouth daily. 06/05/19   Maryagnes Amos, FNP    Allergies    Sulfa antibiotics and Latex  Review of Systems   Review of Systems  All other systems reviewed and are negative.   Physical Exam Updated Vital Signs BP (!) 140/103 (BP Location: Right Arm)   Pulse 88   Temp 98.4 F (36.9 C) (Oral)   Resp 18   Ht 5\' 9"  (1.753 m)   Wt 104.8 kg   SpO2 100%   BMI 34.11 kg/m   Physical Exam  Vitals and nursing note reviewed.   54 year old female, resting comfortably and in no acute distress. Vital signs are significant for elevated blood pressure. Oxygen saturation is 100%, which is normal. Head is normocephalic and atraumatic. PERRLA, EOMI. Oropharynx is clear. Neck is nontender and supple without adenopathy or JVD. Back is nontender and there is no CVA tenderness. Lungs are clear without rales, wheezes, or rhonchi. Chest is nontender. Heart has regular rate and rhythm without murmur. Abdomen is soft, flat, nontender without masses or hepatosplenomegaly and peristalsis is normoactive. Extremities have no cyanosis or edema, full range of motion is present. Skin is warm and  dry without rash. Neurologic: Mental status is normal, cranial nerves are intact, there are no motor or sensory deficits.  ED Results / Procedures / Treatments   Labs (all labs ordered are listed, but only abnormal results are displayed) Labs Reviewed  RAPID URINE DRUG SCREEN, HOSP PERFORMED  CBG MONITORING, ED   Procedures Procedures  Medications Ordered in ED Medications - No data to display  ED Course  I have reviewed the triage vital signs and the nursing notes.  Pertinent lab results that were available during my care of the patient were reviewed by me and considered in my medical decision making (see chart for details).  MDM Rules/Calculators/A&P Homelessness and patient with also history of schizophrenia.  She is now admitting to crack cocaine use and is very resistant to giving a urine sample to confirm that.  I will request TTS consultation and probably will proceed to social service evaluation.  Old records are reviewed, and this is her 12th ED visit this month for similar issues.  Patient is refusing all testing, even CBG.  She states that she does not wish to stay.  Since she is not suicidal or homicidal and not hallucinating, I do not find any indication for involuntary commitment.  Police and filed IVC paperwork and this is rescinded.  She is allowed to leave [place].  Final Clinical Impression(s) / ED Diagnoses Final diagnoses:  Homelessness  Cocaine abuse [facility])    Rx / DC Orders ED Discharge Orders    None       Delora Fuel, MD 44/81/85 [phone]

## 2019-06-10 NOTE — ED Notes (Signed)
Pt stripped and was walking down hall naked yelling "They gonna stick me with a needle, take me to jail." Pt insistent on being arrested claiming she violated her probation and needed to go to jail. Pt touching herself inappropriatly while making inappropriate comments to staff members and MD. Pt was offered medications and offered to leave, but pt refused both. Pt ripped out her earrings and threw them on the ground. Pt also stating that she is not SI or HI and she is in her right mind, she just wants to go to jail for public indecency. Staff was able to escort back to her room and managed to put a gown on her. Pt's ear was bandaged due to it bleeding. Security was on stand by and [employer] was aware also.

## 2019-06-10 NOTE — ED Notes (Signed)
[employer] would like to IVC patient

## 2019-06-10 NOTE — BH Assessment (Signed)
[facility] Assessment Progress Note   Clinician informed by nurse Amber that patient will refuse to talk to clinician.  She said patient only wants to be taken to jail and is refusing to answer questions.

## 2019-06-10 NOTE — ED Notes (Signed)
Patient states she has no thoughts of hurting herself or others, she wants to go to jail

## 2019-06-10 NOTE — ED Notes (Signed)
Patient states she has crack/cocaine in her system and does not need to provide a urine sample she just needs her discharge papers so she can get on to jail.

## 2019-06-10 NOTE — ED Triage Notes (Signed)
Patient was naked in the street. Possible cocaine/crack use Upon arrival patient states

## 2019-06-10 NOTE — ED Provider Notes (Signed)
[place] DEPT Provider Note   CSN: [id number] Arrival date & time: 06/10/19  0413   History Chief Complaint  Patient presents with  . Medical Clearance    Katherine Franklin is a 54 y.o. female.  The history is provided by the police. The history is limited by the condition of the patient (Psychiatric disorder).  She has history of hypertension, diabetes, schizophrenia and was brought in by police when she was found outside naked.  Patient states that she wants to go to jail and denies homicidal or suicidal ideation.  However, I cannot get any additional history from her.  Past Medical History:  Diagnosis Date  . Diabetes mellitus without complication ([place])   . Drug abuse ([place]) 09/22/2018  . GERD (gastroesophageal reflux disease)   . Hypertension   . PTSD (post-traumatic stress disorder)   . Schizo affective schizophrenia [facility])     Patient Active Problem List   Diagnosis Date Noted  . Schizoaffective disorder ([place]) 04/28/2019  . Cellulitis 04/19/2019  . Substance induced mood disorder ([place]) 04/09/2019  . Homelessness 04/09/2019  . History of posttraumatic stress disorder (PTSD) 04/09/2019  . Cocaine dependence ([place]) 08/18/2018  . Major depressive disorder, recurrent severe without psychotic features ([place]) 08/18/2018    Past Surgical History:  Procedure Laterality Date  . SKIN GRAFT       OB History   No obstetric history on file.     Family History  Family history unknown: Yes    Social History   Tobacco Use  . Smoking status: Current Every Day Smoker    Packs/day: 2.00  . Smokeless tobacco: Never Used  Substance Use Topics  . Alcohol use: Not Currently  . Drug use: Yes    Frequency: 7.0 times per week    Types: Cocaine, "Crack" cocaine    Comment: Last used: Early this AM     Home Medications Prior to Admission medications   Medication Sig Start Date End Date Taking? Authorizing Provider  ARIPiprazole (ABILIFY) 10 MG  tablet Take 1 tablet (10 mg total) by mouth daily. 06/05/19   Starkes-Perry, Gayland Curry, FNP  benztropine (COGENTIN) 1 MG tablet Take 1 tablet (1 mg total) by mouth daily. 06/05/19 07/05/19  Starkes-Perry, Gayland Curry, FNP  lisinopril (ZESTRIL) 20 MG tablet Take 1 tablet (20 mg total) by mouth daily. 04/28/19   Sharma Covert, MD  metFORMIN (GLUCOPHAGE) 500 MG tablet Take 1 tablet (500 mg total) by mouth 2 (two) times daily with a meal. Patient not taking: Reported on 06/04/2019 04/28/19   Sharma Covert, MD  sertraline (ZOLOFT) 25 MG tablet Take 1 tablet (25 mg total) by mouth daily. 06/05/19   Suella Broad, FNP    Allergies    Sulfa antibiotics and Latex  Review of Systems   Review of Systems  Unable to perform ROS: Psychiatric disorder    Physical Exam Updated Vital Signs There were no vitals taken for this visit.  Physical Exam Vitals and nursing note reviewed.   54 year old female, agitated, but in no acute distress. She is standing in the hallway completely naked and yelling at staff and telling jokes. Head is normocephalic and atraumatic. PERRLA, EOMI. Oropharynx is clear. Neck is nontender and supple without adenopathy or JVD. Back is nontender and there is no CVA tenderness. Lungs are clear without rales, wheezes, or rhonchi. Chest is nontender. Heart has regular rate and rhythm without murmur. Abdomen is soft, flat, nontender without masses or hepatosplenomegaly and  peristalsis is normoactive. Extremities have no cyanosis or edema, full range of motion is present. Skin is warm and dry without rash. Neurologic: Mental status is as noted above, cranial nerves are intact, there are no motor or sensory deficits.  ED Results / Procedures / Treatments    Procedures Procedures   Medications Ordered in ED Medications - No data to display  ED Course  I have reviewed the triage vital signs and the nursing notes.  MDM Rules/Calculators/A&P Agitated state which may  be part of her underlying schizophrenia, possible drug abuse.  However, she is not homicidal or suicidal and is not desiring any sedatives.  She expresses desire to be taken to jail.  Since there were no indications for involuntary commitment, she is discharged in police custody.  Old records are reviewed, and she has 10 prior ED visits this month and her underlying schizophrenia and homelessness seem to be the major problems.  Final Clinical Impression(s) / ED Diagnoses Final diagnoses:  Aggressive behavior    Rx / DC Orders ED Discharge Orders    None       Dione Booze, MD 06/10/19 [phone]

## 2019-06-12 NOTE — Consult Note (Signed)
Patient discharged from the ED. She denies Si/HI/AVH at this time. She is refusing help from our social work team at this time. She states that she has been homesless since she was aged 54 and that she knows of all the resources. Patient is loud and belligerent, obnoxious. Will discharge at this time.

## 2019-06-17 ENCOUNTER — Encounter (HOSPITAL_COMMUNITY): Payer: Self-pay | Admitting: Family Medicine

## 2019-06-17 ENCOUNTER — Emergency Department (HOSPITAL_COMMUNITY)
Admission: EM | Admit: 2019-06-17 | Discharge: 2019-06-18 | Disposition: A | Discharge status: Home / Self Care | Payer: Medicaid Other | Attending: Emergency Medicine | Admitting: Emergency Medicine

## 2019-06-17 ENCOUNTER — Other Ambulatory Visit: Payer: Self-pay

## 2019-06-17 DIAGNOSIS — R739 Hyperglycemia, unspecified: Secondary | ICD-10-CM

## 2019-06-17 DIAGNOSIS — E1165 Type 2 diabetes mellitus with hyperglycemia: Secondary | ICD-10-CM | POA: Diagnosis not present

## 2019-06-17 DIAGNOSIS — R45851 Suicidal ideations: Secondary | ICD-10-CM | POA: Diagnosis not present

## 2019-06-17 DIAGNOSIS — F1914 Other psychoactive substance abuse with psychoactive substance-induced mood disorder: Secondary | ICD-10-CM | POA: Diagnosis not present

## 2019-06-17 DIAGNOSIS — F1420 Cocaine dependence, uncomplicated: Secondary | ICD-10-CM | POA: Diagnosis not present

## 2019-06-17 DIAGNOSIS — F259 Schizoaffective disorder, unspecified: Secondary | ICD-10-CM | POA: Diagnosis not present

## 2019-06-17 DIAGNOSIS — F1994 Other psychoactive substance use, unspecified with psychoactive substance-induced mood disorder: Secondary | ICD-10-CM | POA: Diagnosis present

## 2019-06-17 DIAGNOSIS — F23 Brief psychotic disorder: Secondary | ICD-10-CM

## 2019-06-17 DIAGNOSIS — Z20822 Contact with and (suspected) exposure to covid-19: Secondary | ICD-10-CM | POA: Insufficient documentation

## 2019-06-17 DIAGNOSIS — F17210 Nicotine dependence, cigarettes, uncomplicated: Secondary | ICD-10-CM | POA: Insufficient documentation

## 2019-06-17 DIAGNOSIS — F29 Unspecified psychosis not due to a substance or known physiological condition: Secondary | ICD-10-CM | POA: Diagnosis not present

## 2019-06-17 DIAGNOSIS — I10 Essential (primary) hypertension: Secondary | ICD-10-CM | POA: Insufficient documentation

## 2019-06-17 DIAGNOSIS — Z79899 Other long term (current) drug therapy: Secondary | ICD-10-CM | POA: Diagnosis not present

## 2019-06-17 DIAGNOSIS — Z046 Encounter for general psychiatric examination, requested by authority: Secondary | ICD-10-CM | POA: Diagnosis present

## 2019-06-17 LAB — COMPREHENSIVE METABOLIC PANEL
ALT: 17 U/L (ref 0–44)
AST: 15 U/L (ref 15–41)
Albumin: 4 g/dL (ref 3.5–5.0)
Alkaline Phosphatase: 86 U/L (ref 38–126)
Anion gap: 10 (ref 5–15)
BUN: 18 mg/dL (ref 6–20)
CO2: 28 mmol/L (ref 22–32)
Calcium: 9.3 mg/dL (ref 8.9–10.3)
Chloride: 99 mmol/L (ref 98–111)
Creatinine, Ser: 1.06 mg/dL — ABNORMAL HIGH (ref 0.44–1.00)
GFR calc Af Amer: >60 mL/min (ref >60)
GFR calc non Af Amer: 59 mL/min — ABNORMAL LOW (ref >60)
Glucose, Bld: 425 mg/dL — ABNORMAL HIGH (ref 70–99)
Potassium: 4.4 mmol/L (ref 3.5–5.1)
Sodium: 137 mmol/L (ref 135–145)
Total Bilirubin: 0.5 mg/dL (ref 0.3–1.2)
Total Protein: 7.4 g/dL (ref 6.5–8.1)

## 2019-06-17 LAB — CBC
HCT: 40.7 % (ref 36.0–46.0)
Hemoglobin: 12.9 g/dL (ref 12.0–15.0)
MCH: 27 pg (ref 26.0–34.0)
MCHC: 31.7 g/dL (ref 30.0–36.0)
MCV: 85.3 fL (ref 80.0–100.0)
Platelets: 316 10*3/uL (ref 150–400)
RBC: 4.77 MIL/uL (ref 3.87–5.11)
RDW: 14.5 % (ref 11.5–15.5)
WBC: 5.1 10*3/uL (ref 4.0–10.5)
nRBC: 0 % (ref 0.0–0.2)

## 2019-06-17 LAB — RESPIRATORY PANEL BY RT PCR (FLU A&B, COVID)
Influenza A by PCR: NEGATIVE
Influenza B by PCR: NEGATIVE
SARS Coronavirus 2 by RT PCR: NEGATIVE

## 2019-06-17 LAB — SALICYLATE LEVEL: Salicylate Lvl: <7 mg/dL — ABNORMAL LOW (ref 7.0–30.0)

## 2019-06-17 LAB — ETHANOL: Alcohol, Ethyl (B): <10 mg/dL (ref <10)

## 2019-06-17 LAB — I-STAT BETA HCG BLOOD, ED (MC, WL, AP ONLY): I-stat hCG, quantitative: <5 m[IU]/mL (ref <5)

## 2019-06-17 LAB — CBG MONITORING, ED: Glucose-Capillary: 329 mg/dL — ABNORMAL HIGH (ref 70–99)

## 2019-06-17 LAB — ACETAMINOPHEN LEVEL: Acetaminophen (Tylenol), Serum: <10 ug/mL — ABNORMAL LOW (ref 10–30)

## 2019-06-17 MED ORDER — METFORMIN HCL ER 750 MG PO TB24
2000.0000 mg | ORAL_TABLET | Freq: Every day | ORAL | Status: DC
  Filled 2019-06-17: qty 1

## 2019-06-17 MED ORDER — BENZTROPINE MESYLATE 1 MG PO TABS
1.0000 mg | ORAL_TABLET | Freq: Every day | ORAL | Status: DC
  Administered 2019-06-18: 1 mg via ORAL
  Filled 2019-06-17: qty 1

## 2019-06-17 MED ORDER — SERTRALINE HCL 50 MG PO TABS
25.0000 mg | ORAL_TABLET | Freq: Every day | ORAL | Status: DC
  Administered 2019-06-18: 25 mg via ORAL
  Filled 2019-06-17: qty 1

## 2019-06-17 MED ORDER — PANTOPRAZOLE SODIUM 40 MG PO TBEC
40.0000 mg | DELAYED_RELEASE_TABLET | Freq: Every day | ORAL | Status: DC
  Administered 2019-06-18: 40 mg via ORAL
  Filled 2019-06-17: qty 1

## 2019-06-17 MED ORDER — ZIPRASIDONE MESYLATE 20 MG IM SOLR: 20.0000 mg | Freq: Once | INTRAMUSCULAR | Status: AC

## 2019-06-17 MED ORDER — ZIPRASIDONE MESYLATE 20 MG IM SOLR
INTRAMUSCULAR | Status: AC
  Administered 2019-06-17: 20 mg via INTRAMUSCULAR
  Filled 2019-06-17: qty 20

## 2019-06-17 MED ORDER — ARIPIPRAZOLE 10 MG PO TABS
10.0000 mg | ORAL_TABLET | Freq: Every day | ORAL | Status: DC
  Administered 2019-06-18: 10 mg via ORAL
  Filled 2019-06-17: qty 1

## 2019-06-17 MED ORDER — ZIPRASIDONE MESYLATE 20 MG IM SOLR: 20.0000 mg | Freq: Once | INTRAMUSCULAR | Status: DC

## 2019-06-17 MED ORDER — GABAPENTIN 100 MG PO CAPS
100.0000 mg | ORAL_CAPSULE | Freq: Every day | ORAL | Status: DC
  Administered 2019-06-18: 100 mg via ORAL
  Filled 2019-06-17: qty 1

## 2019-06-17 MED ORDER — STERILE WATER FOR INJECTION IJ SOLN
INTRAMUSCULAR | Status: AC
  Administered 2019-06-17: 1 mL
  Filled 2019-06-17: qty 10

## 2019-06-17 MED ORDER — INSULIN ASPART 100 UNIT/ML IV SOLN
10.0000 [IU] | Freq: Once | INTRAVENOUS | Status: AC
  Administered 2019-06-17: 10 [IU] via SUBCUTANEOUS
  Filled 2019-06-17: qty 0.1

## 2019-06-17 MED ORDER — LISINOPRIL 20 MG PO TABS
20.0000 mg | ORAL_TABLET | Freq: Every day | ORAL | Status: DC
  Administered 2019-06-18: 20 mg via ORAL
  Filled 2019-06-17: qty 1

## 2019-06-17 NOTE — ED Triage Notes (Signed)
Patient was picked up from [employer] by [facility]. Patient has past mental disorders and not been taking her medication. She has voiced suicidal thoughts with no plan. She states these thoughts have occurred "all my life". Patient is cooperative, but displaying manic behavior.

## 2019-06-17 NOTE — ED Notes (Signed)
Pt currently standing in the middle of triage with her breast exposed. Pt is stating that she wants to go to jail for indecent exposure Pt making statements: "I'll do whatever I have to do to go to jail" Pt making very sexually explicit comments about her mother, herself, and the [employer] officers in triage Pt refusing to cover herself or quiet down  Security in triage

## 2019-06-17 NOTE — ED Notes (Signed)
EDP Delo up to see pt EDP Delo states pt will be IVC'd  Pt currently refusing to go back into Triage rm 8

## 2019-06-17 NOTE — ED Provider Notes (Signed)
[facility]-EMERGENCY DEPT Provider Note   CSN: [id number] Arrival date & time: 06/17/19  1920     History Chief Complaint  Patient presents with  . Suicidal    Katherine Franklin is a 54 y.o. female.  Patient is a 54 year old female with history of schizoaffective disorder, history of drug abuse, diabetes.  She is brought by authorities for evaluation of manic behavior.  Patient was apparently screaming and yelling and exposing herself at the [facility].  She continues to do this here.  She is ranting, screaming obscenities, threatening herself and others, and exposing herself in triage causing quite a scene.  Patient refuses to add additional information due to mania.  The history is provided by the patient.       Past Medical History:  Diagnosis Date  . Diabetes mellitus without complication (HCC)   . Drug abuse (HCC) 09/22/2018  . GERD (gastroesophageal reflux disease)   . Hypertension   . PTSD (post-traumatic stress disorder)   . Schizo affective schizophrenia [facility])     Patient Active Problem List   Diagnosis Date Noted  . Schizoaffective disorder (HCC) 04/28/2019  . Cellulitis 04/19/2019  . Substance induced mood disorder (HCC) 04/09/2019  . Homelessness 04/09/2019  . History of posttraumatic stress disorder (PTSD) 04/09/2019  . Cocaine dependence (HCC) 08/18/2018  . Major depressive disorder, recurrent severe without psychotic features (HCC) 08/18/2018    Past Surgical History:  Procedure Laterality Date  . SKIN GRAFT       OB History   No obstetric history on file.     Family History  Family history unknown: Yes    Social History   Tobacco Use  . Smoking status: Current Every Day Smoker    Packs/day: 2.00  . Smokeless tobacco: Never Used  Substance Use Topics  . Alcohol use: Not Currently  . Drug use: Yes    Frequency: 7.0 times per week    Types: Cocaine, "Crack" cocaine    Home Medications Prior to Admission  medications   Medication Sig Start Date End Date Taking? Authorizing Provider  ARIPiprazole (ABILIFY) 10 MG tablet Take 1 tablet (10 mg total) by mouth daily. 06/05/19   Starkes-Perry, Juel Burrow, FNP  benztropine (COGENTIN) 1 MG tablet Take 1 tablet (1 mg total) by mouth daily. 06/05/19 07/05/19  Starkes-Perry, Juel Burrow, FNP  lisinopril (ZESTRIL) 20 MG tablet Take 1 tablet (20 mg total) by mouth daily. 04/28/19   Antonieta Pert, MD  metFORMIN (GLUCOPHAGE) 500 MG tablet Take 1 tablet (500 mg total) by mouth 2 (two) times daily with a meal. Patient not taking: Reported on 06/04/2019 04/28/19   Antonieta Pert, MD  sertraline (ZOLOFT) 25 MG tablet Take 1 tablet (25 mg total) by mouth daily. 06/05/19   Maryagnes Amos, FNP    Allergies    Sulfa antibiotics and Latex  Review of Systems   Review of Systems  Unable to perform ROS: Psychiatric disorder    Physical Exam Updated Vital Signs BP 135/89 (BP Location: Left Arm)   Pulse (!) 104   Temp 99.1 F (37.3 C) (Oral)   Resp 18   SpO2 99%   Physical Exam Vitals and nursing note reviewed.  Constitutional:      General: She is not in acute distress.    Appearance: She is well-developed. She is not diaphoretic.  HENT:     Head: Normocephalic and atraumatic.  Cardiovascular:     Rate and Rhythm: Normal rate and regular rhythm.  Heart sounds: No murmur. No friction rub. No gallop.   Pulmonary:     Effort: Pulmonary effort is normal. No respiratory distress.     Breath sounds: Normal breath sounds. No wheezing.  Abdominal:     General: Bowel sounds are normal. There is no distension.     Palpations: Abdomen is soft.     Tenderness: There is no abdominal tenderness.  Musculoskeletal:        General: Normal range of motion.     Cervical back: Normal range of motion and neck supple.  Skin:    General: Skin is warm and dry.  Neurological:     Mental Status: She is alert and oriented to person, place, and time.  Psychiatric:         Mood and Affect: Affect is angry.        Speech: Speech is rapid and pressured and tangential.        Behavior: Behavior is agitated, aggressive and hyperactive.        Judgment: Judgment is inappropriate.     ED Results / Procedures / Treatments   Labs (all labs ordered are listed, but only abnormal results are displayed) Labs Reviewed  COMPREHENSIVE METABOLIC PANEL  ETHANOL  SALICYLATE LEVEL  ACETAMINOPHEN LEVEL  CBC  RAPID URINE DRUG SCREEN, HOSP PERFORMED  I-STAT BETA HCG BLOOD, ED ([facility], [facility], [facility] ONLY)    EKG None  Radiology No results found.  Procedures Procedures (including critical care time)  Medications Ordered in ED Medications  ziprasidone (GEODON) injection 20 mg (has no administration in time range)  ziprasidone (GEODON) injection 20 mg (has no administration in time range)  ziprasidone (GEODON) 20 MG injection (has no administration in time range)  sterile water (preservative free) injection (has no administration in time range)    ED Course  I have reviewed the triage vital signs and the nursing notes.  Pertinent labs & imaging results that were available during my care of the patient were reviewed by me and considered in my medical decision making (see chart for details).    MDM Rules/Calculators/A&P  Patient is a 54 year old female brought by authorities for evaluation of manic behavior.  Patient apparently violent, agitated, and removing her clothing at a local store.  Patient arrived here extremely agitated, chanting, threatening harm to herself and others.  Patient taking off her clothes and exposing her breast in triage.  She required IVC by myself, and IM Geodon for sedation for her own safety and the safety of the patient's and hospital staff around her.  Patient's work-up reveals unremarkable laboratory studies with the exception of a glucose of 425 with no evidence for dehydration or DKA.  Patient will be given subcutaneous insulin and  observed.  Patient to undergo evaluation by TTS who will assist in the final disposition.  CRITICAL CARE Performed by: Geoffery Lyons Total critical care time: 35 minutes Critical care time was exclusive of separately billable procedures and treating other patients. Critical care was necessary to treat or prevent imminent or life-threatening deterioration. Critical care was time spent personally by me on the following activities: development of treatment plan with patient and/or surrogate as well as nursing, discussions with consultants, evaluation of patient's response to treatment, examination of patient, obtaining history from patient or surrogate, ordering and performing treatments and interventions, ordering and review of laboratory studies, ordering and review of radiographic studies, pulse oximetry and re-evaluation of patient's condition.   Final Clinical Impression(s) / ED Diagnoses Final diagnoses:  None    Rx / DC Orders ED Discharge Orders    None       Veryl Speak, MD 06/17/19 [phone]

## 2019-06-17 NOTE — Progress Notes (Signed)
Received Katherine Franklin from triage via wheelchair with security, technician and nurse. She was dressed into scrubs after the technician got blood work and a  Covid test was performed. She is sleeping in bed and her personal clothes were put in the locker. She woke up a couple of times throughout the night, but was redirected back to her room  After receiving a snack.

## 2019-06-17 NOTE — ED Notes (Signed)
Writer attempt to collect labs. PT state she refuse everything and would like to go to jail. RN have been info.

## 2019-06-18 MED ORDER — OLANZAPINE 10 MG IM SOLR
10.0000 mg | Freq: Once | INTRAMUSCULAR | Status: AC
  Administered 2019-06-18: 10 mg via INTRAMUSCULAR
  Filled 2019-06-18: qty 10

## 2019-06-18 MED ORDER — MIDAZOLAM HCL 2 MG/2ML IJ SOLN
2.0000 mg | Freq: Once | INTRAMUSCULAR | Status: AC
  Administered 2019-06-18: 2 mg via INTRAMUSCULAR
  Filled 2019-06-18: qty 2

## 2019-06-18 MED ORDER — POTASSIUM CHLORIDE CRYS ER 20 MEQ PO TBCR
EXTENDED_RELEASE_TABLET | ORAL | Status: AC
  Filled 2019-06-18: qty 2

## 2019-06-18 MED ORDER — STERILE WATER FOR INJECTION IJ SOLN
INTRAMUSCULAR | Status: AC
  Filled 2019-06-18: qty 10

## 2019-06-18 NOTE — ED Notes (Signed)
Pt alert. Pt irritable, agitated, talking about going home.

## 2019-06-18 NOTE — BHH Counselor (Signed)
Per Nurse, Randa Evens pt unable to cooperate in assessment at this time, pt currently sleeping, TTS will complete assessment once pt is awake and roused to engage in assessment.

## 2019-06-18 NOTE — Patient Outreach (Signed)
ED Peer Support Specialist Patient Intake (Complete at intake & 30-60 Day Follow-up)  Name: Katherine Franklin  MRN: [id number]  Age: 54 y.o.   Date of Admission: 06/18/2019  Intake: Initial Comments:      Primary Reason Admitted: Suicidal, Psychiatric Evaluation   Lab values: Alcohol/ETOH: Not completed Positive UDS? Yes Amphetamines: No Barbiturates: No Benzodiazepines: No Cocaine: Yes Opiates: No Cannabinoids: No  Demographic information: Gender: Female Ethnicity: African American Marital Status: Separated Insurance Status: Medicaid Ecologist (Work Neurosurgeon, Physicist, medical, etc.: No(SSI) Lives with: Alone Living situation: Homeless  Reported Patient History: Patient reported health conditions: None Patient aware of HIV and hepatitis status: No  In past year, has patient visited ED for any reason? No  Number of ED visits:    Reason(s) for visit:    In past year, has patient been hospitalized for any reason? No  Number of hospitalizations:    Reason(s) for hospitalization:    In past year, has patient been arrested? Yes  Number of arrests: 2  Reason(s) for arrest: non compliance  In past year, has patient been incarcerated? No  Number of incarcerations:    Reason(s) for incarceration:    In past year, has patient received medication-assisted treatment? No  In past year, patient received the following treatments:    In past year, has patient received any harm reduction services? No  Did this include any of the following?    In past year, has patient received care from a mental health provider for diagnosis other than SUD? No  In past year, is this first time patient has overdosed? No  Number of past overdoses:    In past year, is this first time patient has been hospitalized for an overdose? No  Number of hospitalizations for overdose(s):    Is patient currently receiving treatment for a mental health diagnosis?  No  Patient reports experiencing difficulty participating in SUD treatment: No    Most important reason(s) for this difficulty?    Has patient received prior services for treatment? No  In past, patient has received services from following agencies:    Plan of Care:  Suggested follow up at these agencies/treatment centers:    Other information: CPSS met with Pt an was able to process with Pt to gain information to better assist Pt. CPSS was unable to complete series of question due to Pt being verbally non compliant. CPSS prompted Pt several times to calm down as well as stop talking to staff in such a manner. CPSS addressed ACTT services that Pt may benefit from however Pt cursed CPSS and addressed what she is not willing to deal with. CPSS left contact information for if Pt wants to community services at a later date. `89   London Sheer, CPSS  06/18/2019 11:55 AM

## 2019-06-18 NOTE — ED Notes (Signed)
Patient denies pain and is resting comfortably.  

## 2019-06-18 NOTE — Discharge Instructions (Signed)
Hyperglycemia Hyperglycemia is when the sugar (glucose) level in your blood is too high. It may not cause symptoms. If you do have symptoms, they may include warning signs, such as:  Feeling more thirsty than normal.  Hunger.  Feeling tired.  Needing to pee (urinate) more than normal.  Blurry eyesight (vision). You may get other symptoms as it gets worse, such as:  Dry mouth.  Not being hungry (loss of appetite).  Fruity-smelling breath.  Weakness.  Weight gain or loss that is not planned. Weight loss may be fast.  A tingling or numb feeling in your hands or feet.  Headache.  Skin that does not bounce back quickly when it is lightly pinched and released (poor skin turgor).  Pain in your belly (abdomen).  Cuts or bruises that heal slowly. High blood sugar can happen to people who do or do not have diabetes. High blood sugar can happen slowly or quickly, and it can be an emergency. Follow these instructions at home: General instructions  Take over-the-counter and prescription medicines only as told by your doctor.  Do not use products that contain nicotine or tobacco, such as cigarettes and e-cigarettes. If you need help quitting, ask your doctor.  Limit alcohol intake to no more than 1 drink per day for nonpregnant women and 2 drinks per day for men. One drink equals 12 oz of beer, 5 oz of wine, or 1 oz of hard liquor.  Manage stress. If you need help with this, ask your doctor.  Keep all follow-up visits as told by your doctor. This is important. Eating and drinking   Stay at a healthy weight.  Exercise regularly, as told by your doctor.  Drink enough fluid, especially when you: ? Exercise. ? Get sick. ? Are in hot temperatures.  Eat healthy foods, such as: ? Low-fat (lean) proteins. ? Complex carbs (complex carbohydrates), such as whole wheat bread or brown rice. ? Fresh fruits and vegetables. ? Low-fat dairy products. ? Healthy fats.  Drink enough  fluid to keep your pee (urine) clear or pale yellow. If you have diabetes:   Make sure you know the symptoms of hyperglycemia.  Follow your diabetes management plan, as told by your doctor. Make sure you: ? Take insulin and medicines as told. ? Follow your exercise plan. ? Follow your meal plan. Eat on time. Do not skip meals. ? Check your blood sugar as often as told. Make sure to check before and after exercise. If you exercise longer or in a different way than you normally do, check your blood sugar more often. ? Follow your sick day plan whenever you cannot eat or drink normally. Make this plan ahead of time with your doctor.  Share your diabetes management plan with people in your workplace, school, and household.  Check your urine for ketones when you are ill and as told by your doctor.  Carry a card or wear jewelry that says that you have diabetes. Contact a doctor if:  Your blood sugar level is higher than 240 mg/dL (13.3 mmol/L) for 2 days in a row.  You have problems keeping your blood sugar in your target range.  High blood sugar happens often for you. Get help right away if:  You have trouble breathing.  You have a change in how you think, feel, or act (mental status).  You feel sick to your stomach (nauseous), and that feeling does not go away.  You cannot stop throwing up (vomiting). These symptoms may   be an emergency. Do not wait to see if the symptoms will go away. Get medical help right away. Call your local emergency services (911 in the [place] Do not drive yourself to the hospital. Summary  Hyperglycemia is when the sugar (glucose) level in your blood is too high.  High blood sugar can happen to people who do or do not have diabetes.  Make sure you drink enough fluids, eat healthy foods, and exercise regularly.  Contact your doctor if you have problems keeping your blood sugar in your target range. This information is not intended to replace advice given  to you by your health care provider. Make sure you discuss any questions you have with your health care provider. Document Revised: 01/16/2016 Document Reviewed: 01/16/2016 Elsevier Patient Education  2020 [employer]. _________________________________________________________________________________________________________________________________________________________________________________ For your behavioral health needs, you are advised to follow up with an outpatient provider.  You may be eligible for ACT Team services, which would include more frequent visits with your provider, as well as in-home services.  The following providers offer ACT Team services.  Contact them at your earliest opportunity to ask about enrolling in their program:       [facility]      [address]      [place], [place] [address]      [phone]       [facility]      [address]      [place], [place] [address]      [phone]       [facility]      [address] [place] [facility], [address]      [address]      [place], [place]  [address]      [phone]       [facility]      [address]      [place], [place] [address]      [phone]

## 2019-06-18 NOTE — ED Notes (Signed)
DC'd out of hospital with Engineer, materials.
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
## 2019-06-18 NOTE — BH Assessment (Signed)
[facility] Assessment Progress Note  Per Berneice Heinrich, FNP, this pt does not require psychiatric hospitalization at this time.  Pt presents under IVC initiated by EDP Geoffery Lyons, MD, which has been rescinded by Nelly Rout, MD.  Pt is to be discharged from [facility] with referral information for area ACT Teams.  This has been included in pt's discharge instructions.  Pt would also benefit from seeing Peer Support Specialists, and a peer support consult has been ordered for pt.  Pt's nurse has been notified.  Doylene Canning, MA Triage Specialist [phone]

## 2019-06-18 NOTE — [facility] Suicide Risk Assessment (Cosign Needed)
Suicide Risk Assessment  Discharge Assessment   [facility] Discharge Suicide Risk Assessment   Principal Problem: Substance induced mood disorder (HCC) Discharge Diagnoses: Principal Problem:   Substance induced mood disorder (HCC) Active Problems:   Cocaine dependence (HCC)   Total Time spent with patient: 30 minutes  Musculoskeletal: Strength & Muscle Tone: within normal limits Gait & Station: normal Patient leans: N/A  Psychiatric Specialty Exam:   Blood pressure 113/81, pulse 80, temperature 98.4 F (36.9 C), temperature source Oral, resp. rate 18, SpO2 100 %.There is no height or weight on file to calculate BMI.  General Appearance: Casual and Fairly Groomed  Eye Contact::  Good  Speech:  Clear and Coherent and Normal Rate409  Volume:  Normal  Mood:  Anxious  Affect:  Congruent  Thought Process:  Coherent, Goal Directed and Descriptions of Associations: Intact  Orientation:  Full (Time, Place, and Person)  Thought Content:  WDL and Logical  Suicidal Thoughts:  No  Homicidal Thoughts:  No  Memory:  Immediate;   Good Recent;   Good Remote;   Good  Judgement:  Fair  Insight:  Fair  Psychomotor Activity:  Normal  Concentration:  Good  Recall:  Good  Fund of Knowledge:Good  Language: Good  Akathisia:  No  Handed:  Right  AIMS (if indicated):     Assets:  Communication Skills Desire for Improvement Financial Resources/Insurance Intimacy Leisure Time Physical Health Resilience Social Support Talents/Skills  Sleep:     Cognition: WNL  ADL's:  Intact   Mental Status Per Nursing Assessment::   On Admission:   Patient assessed by nurse practitioner.  Patient alert and oriented, answers appropriately.  Patient states states "I have been homeless since I was 54 years old.  A man is letting me stay with him because I am homeless but I am going there to get my closed in my pocket book when I leave here." Patient denies suicidal and homicidal ideations.  Patient denies  history of self-harm.  Patient denies access to weapons.  Patient denies auditory and visual hallucinations. Patient seen along with Dr. Lucianne Muss.   Peer support consult ordered.  Demographic Factors:  NA  Loss Factors: NA  Historical Factors: NA  Risk Reduction Factors:   Positive social support, Positive therapeutic relationship and Positive coping skills or problem solving skills  Continued Clinical Symptoms:  Alcohol/Substance Abuse/Dependencies  Cognitive Features That Contribute To Risk:  None    Suicide Risk:  Minimal: No identifiable suicidal ideation.  Patients presenting with no risk factors but with morbid ruminations; may be classified as minimal risk based on the severity of the depressive symptoms    Plan Of Care/Follow-up recommendations:  Other:  Follow up with substance use resources  Patrcia Dolly, FNP 06/18/2019, 11:17 AM

## 2019-06-19 ENCOUNTER — Emergency Department (HOSPITAL_COMMUNITY)
Admission: EM | Admit: 2019-06-19 | Discharge: 2019-06-19 | Disposition: A | Discharge status: Home / Self Care | Payer: Medicaid Other | Attending: Emergency Medicine | Admitting: Emergency Medicine

## 2019-06-19 DIAGNOSIS — F17200 Nicotine dependence, unspecified, uncomplicated: Secondary | ICD-10-CM | POA: Insufficient documentation

## 2019-06-19 DIAGNOSIS — Z91040 Latex allergy status: Secondary | ICD-10-CM | POA: Diagnosis not present

## 2019-06-19 DIAGNOSIS — Z590 Homelessness: Secondary | ICD-10-CM | POA: Diagnosis not present

## 2019-06-19 DIAGNOSIS — F1990 Other psychoactive substance use, unspecified, uncomplicated: Secondary | ICD-10-CM | POA: Diagnosis not present

## 2019-06-19 DIAGNOSIS — E119 Type 2 diabetes mellitus without complications: Secondary | ICD-10-CM | POA: Insufficient documentation

## 2019-06-19 DIAGNOSIS — I10 Essential (primary) hypertension: Secondary | ICD-10-CM | POA: Diagnosis not present

## 2019-06-19 DIAGNOSIS — R451 Restlessness and agitation: Secondary | ICD-10-CM

## 2019-06-19 DIAGNOSIS — F259 Schizoaffective disorder, unspecified: Secondary | ICD-10-CM | POA: Insufficient documentation

## 2019-06-19 DIAGNOSIS — Z79899 Other long term (current) drug therapy: Secondary | ICD-10-CM | POA: Diagnosis not present

## 2019-06-19 DIAGNOSIS — F99 Mental disorder, not otherwise specified: Secondary | ICD-10-CM | POA: Diagnosis present

## 2019-06-19 DIAGNOSIS — Z7984 Long term (current) use of oral hypoglycemic drugs: Secondary | ICD-10-CM | POA: Insufficient documentation

## 2019-06-19 LAB — CBC WITH DIFFERENTIAL/PLATELET
Abs Immature Granulocytes: 0.1 10*3/uL — ABNORMAL HIGH (ref 0.00–0.07)
Basophils Absolute: 0.1 10*3/uL (ref 0.0–0.1)
Basophils Relative: 1 %
Eosinophils Absolute: 0.1 10*3/uL (ref 0.0–0.5)
Eosinophils Relative: 2 %
HCT: 41.9 % (ref 36.0–46.0)
Hemoglobin: 13.5 g/dL (ref 12.0–15.0)
Lymphocytes Relative: 24 %
Lymphs Abs: 1.8 10*3/uL (ref 0.7–4.0)
MCH: 27.5 pg (ref 26.0–34.0)
MCHC: 32.2 g/dL (ref 30.0–36.0)
MCV: 85.3 fL (ref 80.0–100.0)
Monocytes Absolute: 0.4 10*3/uL (ref 0.1–1.0)
Monocytes Relative: 5 %
Neutro Abs: 5 10*3/uL (ref 1.7–7.7)
Neutrophils Relative %: 67 %
Platelets: 346 10*3/uL (ref 150–400)
Promyelocytes Relative: 1 %
RBC: 4.91 MIL/uL (ref 3.87–5.11)
RDW: 14.4 % (ref 11.5–15.5)
WBC: 7.4 10*3/uL (ref 4.0–10.5)
nRBC: 0 % (ref 0.0–0.2)
nRBC: 0 /100 WBC

## 2019-06-19 LAB — BASIC METABOLIC PANEL
Anion gap: 10 (ref 5–15)
BUN: 21 mg/dL — ABNORMAL HIGH (ref 6–20)
CO2: 28 mmol/L (ref 22–32)
Calcium: 9.3 mg/dL (ref 8.9–10.3)
Chloride: 99 mmol/L (ref 98–111)
Creatinine, Ser: 1 mg/dL (ref 0.44–1.00)
GFR calc Af Amer: >60 mL/min (ref >60)
GFR calc non Af Amer: >60 mL/min (ref >60)
Glucose, Bld: 406 mg/dL — ABNORMAL HIGH (ref 70–99)
Potassium: 4.4 mmol/L (ref 3.5–5.1)
Sodium: 137 mmol/L (ref 135–145)

## 2019-06-19 LAB — ETHANOL: Alcohol, Ethyl (B): <10 mg/dL (ref <10)

## 2019-06-19 LAB — SALICYLATE LEVEL: Salicylate Lvl: <7 mg/dL — ABNORMAL LOW (ref 7.0–30.0)

## 2019-06-19 LAB — ACETAMINOPHEN LEVEL: Acetaminophen (Tylenol), Serum: <10 ug/mL — ABNORMAL LOW (ref 10–30)

## 2019-06-19 MED ORDER — METFORMIN HCL 500 MG PO TABS
2000.0000 mg | ORAL_TABLET | Freq: Once | ORAL | Status: AC
  Administered 2019-06-19: 2000 mg via ORAL
  Filled 2019-06-19: qty 4

## 2019-06-19 MED ORDER — LORAZEPAM 2 MG/ML IJ SOLN
1.0000 mg | Freq: Once | INTRAMUSCULAR | Status: DC
  Filled 2019-06-19: qty 1

## 2019-06-19 MED ORDER — SODIUM CHLORIDE 0.9 % IV BOLUS
1000.0000 mL | Freq: Once | INTRAVENOUS | Status: AC
  Administered 2019-06-19: 1000 mL via INTRAVENOUS

## 2019-06-19 MED ORDER — ZIPRASIDONE MESYLATE 20 MG IM SOLR: 10.0000 mg | Freq: Once | INTRAMUSCULAR | Status: DC

## 2019-06-19 NOTE — ED Provider Notes (Addendum)
[facility] EMERGENCY DEPARTMENT Provider Note   CSN: [id number] Arrival date & time: 06/19/19  1043     History Chief Complaint  Patient presents with  . Psychiatric Evaluation  . Addiction Problem    Katherine Franklin is a 54 y.o. female.  HPI   54 y/o F with a h/o DM, drug abuse, GERD, HTN, PTSD, schizoaffective schizophrenia, who presents to the ED today for psychiatric eval. Pt is here with her counselor. He states that the patient was wandering naked outside and acting erratically. He is requesting evaluation for possible emergency IVC.  Pt denies any medical complaints at this time. She states she did vomit earlier after doing crack but denies nausea now. She denies HI, SI, or AVH. She states she did crack earlier this morning and that she does it "24/7". She is acting erratically during history taking. She makes several inapprorpiate comments and continually uses profanity.   Reviewed records. Pt was seen in the ED today for eval of similar sxs. She was evaluated by psych yesterday and d/c'ed.  Past Medical History:  Diagnosis Date  . Diabetes mellitus without complication (HCC)   . Drug abuse (HCC) 09/22/2018  . GERD (gastroesophageal reflux disease)   . Hypertension   . PTSD (post-traumatic stress disorder)   . Schizo affective schizophrenia [facility])     Patient Active Problem List   Diagnosis Date Noted  . Schizoaffective disorder (HCC) 04/28/2019  . Cellulitis 04/19/2019  . Substance induced mood disorder (HCC) 04/09/2019  . Homelessness 04/09/2019  . History of posttraumatic stress disorder (PTSD) 04/09/2019  . Cocaine dependence (HCC) 08/18/2018  . Major depressive disorder, recurrent severe without psychotic features (HCC) 08/18/2018    Past Surgical History:  Procedure Laterality Date  . SKIN GRAFT       OB History   No obstetric history on file.     Family History  Family history unknown: Yes    Social History   Tobacco Use  .  Smoking status: Current Every Day Smoker    Packs/day: 2.00  . Smokeless tobacco: Never Used  Substance Use Topics  . Alcohol use: Not Currently  . Drug use: Yes    Frequency: 7.0 times per week    Types: Cocaine, "Crack" cocaine    Home Medications Prior to Admission medications   Medication Sig Start Date End Date Taking? Authorizing Provider  ARIPiprazole (ABILIFY) 10 MG tablet Take 1 tablet (10 mg total) by mouth daily. 06/05/19   Starkes-Perry, Juel Burrow, FNP  benztropine (COGENTIN) 1 MG tablet Take 1 tablet (1 mg total) by mouth daily. 06/05/19 07/05/19  Maryagnes Amos, FNP  gabapentin (NEURONTIN) 100 MG capsule Take 100 mg by mouth at bedtime. 05/31/19   [provider]  lisinopril (ZESTRIL) 20 MG tablet Take 1 tablet (20 mg total) by mouth daily. 04/28/19   Antonieta Pert, MD  metFORMIN (GLUCOPHAGE-XR) 500 MG 24 hr tablet Take 2,000 mg by mouth every morning. 05/31/19   [provider]  pantoprazole (PROTONIX) 40 MG tablet Take 40 mg by mouth daily. 05/31/19   [provider]  sertraline (ZOLOFT) 25 MG tablet Take 1 tablet (25 mg total) by mouth daily. 06/05/19   Maryagnes Amos, FNP    Allergies    Sulfa antibiotics and Latex  Review of Systems   Review of Systems  Constitutional: Negative for chills and fever.  HENT: Negative for ear pain and sore throat.   Eyes: Negative for visual disturbance.  Respiratory:  Negative for cough and shortness of breath.   Cardiovascular: Negative for chest pain.  Gastrointestinal: Positive for vomiting (resolved). Negative for abdominal pain, constipation, diarrhea and nausea.  Genitourinary: Negative for dysuria and hematuria.  Musculoskeletal: Negative for arthralgias and back pain.  Skin: Negative for color change and rash.  Neurological: Negative for seizures and syncope.  Psychiatric/Behavioral: Negative for self-injury and suicidal ideas.  All other systems reviewed and are  negative.   Physical Exam Updated Vital Signs BP 122/89 (BP Location: Right Arm)   Pulse (!) 103   Temp 98 F (36.7 C) (Oral)   Resp 20   SpO2 98%   Physical Exam Vitals and nursing note reviewed.  Constitutional:      General: She is not in acute distress.    Appearance: She is well-developed.  HENT:     Head: Normocephalic and atraumatic.  Eyes:     Conjunctiva/sclera: Conjunctivae normal.  Cardiovascular:     Rate and Rhythm: Regular rhythm. Tachycardia present.     Pulses: Normal pulses.     Heart sounds: Normal heart sounds. No murmur.  Pulmonary:     Effort: Pulmonary effort is normal. No respiratory distress.     Breath sounds: Normal breath sounds. No wheezing, rhonchi or rales.  Abdominal:     General: Bowel sounds are normal.     Palpations: Abdomen is soft.     Tenderness: There is no abdominal tenderness. There is no guarding or rebound.  Musculoskeletal:     Cervical back: Neck supple.  Skin:    General: Skin is warm and dry.  Neurological:     Mental Status: She is alert.     Comments: Alert, clear speech, moving all extremities     ED Results / Procedures / Treatments   Labs (all labs ordered are listed, but only abnormal results are displayed) Labs Reviewed  CBC WITH DIFFERENTIAL/PLATELET - Abnormal; Notable for the following components:      Result Value   Abs Immature Granulocytes 0.10 (*)    All other components within normal limits  BASIC METABOLIC PANEL - Abnormal; Notable for the following components:   Glucose, Bld 406 (*)    BUN 21 (*)    All other components within normal limits  ACETAMINOPHEN LEVEL - Abnormal; Notable for the following components:   Acetaminophen (Tylenol), Serum <10 (*)    All other components within normal limits  SALICYLATE LEVEL - Abnormal; Notable for the following components:   Salicylate Lvl <7.0 (*)    All other components within normal limits  ETHANOL  RAPID URINE DRUG SCREEN, HOSP PERFORMED     EKG None  Radiology No results found.  Procedures Procedures (including critical care time)  Medications Ordered in ED Medications  LORazepam (ATIVAN) injection 1 mg (1 mg Intramuscular Not Given 06/19/19 1304)  metFORMIN (GLUCOPHAGE) tablet 2,000 mg (2,000 mg Oral Given 06/19/19 1355)  sodium chloride 0.9 % bolus 1,000 mL (1,000 mLs Intravenous New Bag/Given 06/19/19 1354)    ED Course  I have reviewed the triage vital signs and the nursing notes.  Pertinent labs & imaging results that were available during my care of the patient were reviewed by me and considered in my medical decision making (see chart for details).    MDM Rules/Calculators/A&P                      54 y/o F presenting for psych eval after being found running naked outside. Denies SI, HI  or AVH.  Pt without medical complaints. Reviewed labs  Cbc wnl Bmp with elevated BS but on other findings to suggest dka etoh neg Salicylate and tylenol are neg  Following psych assessment. Pt agitated and stating that she wants to leave the ED. Pt attempted to elope from the ED with IV in her arm.  IVC paperwork was preemptively completed but not filed while awaiting psychiatric recommendations. After discussing case with psychiatry, they feel that patient did not meet inpatient criteria at this time.  There was a miscommunication about IVC paperwork already being completed, and pt was brought back to the ED by [employer].  Upon being brought back to the ED, she was promptly discharged.  Pt was given info for [facility]. D/c'ed in stable condition.  Case discussed with Dr. Maryan Rued who is in agreement with the plan.   Final Clinical Impression(s) / ED Diagnoses Final diagnoses:  Agitation  Substance use    Rx / DC Orders ED Discharge Orders    None       Rodney Booze, PA-C 06/19/19 [address], PA-C 06/19/19 1610    Blanchie Dessert, MD 06/22/19 [phone]

## 2019-06-19 NOTE — Progress Notes (Signed)
CSW faxed over outpatient resources for RN to provide to the patient.   CSW has also added information to patient's AVS.   Drucilla Schmidt, MSW, LCSW-A Clinical Disposition Social Worker [employer] [facility]/TTS [phone]
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
## 2019-06-19 NOTE — ED Notes (Signed)
Pt becoming increasingly agitated. Pt screaming and yelling to leave. Ran out the EMS bay. IV still in arm.

## 2019-06-19 NOTE — ED Notes (Signed)
[facility]'s [facility] at [facility].

## 2019-06-19 NOTE — Discharge Instructions (Addendum)
Outpatient Psychiatry and/or Counseling  [facility] * (Intensive outpatient, partial hospitalization, individual therapy, and medication management) [address] [place], [place] [address] [phone] [facility] * (medication management and therapy) [address] [place], [place] [address] [phone] [facility] (Therapy only)* (takes [place] and most major insurances) [place]:  [address] [facility] [place], [place]  Phone: [phone] [place]:  [address], [place], [place] [address]   Phone: [phone] [place]:  [address] [place], [place] [address]  Phone: [phone] [facility]  (specializes in trauma therapy, substance abuse and mood disorders. Takes patients without insurance for little to no cost out of pocket. Also takes most major insurances) [address],  [place], [place] [address] [phone] [facility] * (therapy and medication management) [address] [place], [place] [address] [phone] [facility] (Medication Management) [address] [place], [place] [address] [phone] [facility] * (therapy only) [address] [place], [place] [address] [phone] [facility] [address] [place] [address] [phone] [facility] * [address] [place], [place] [address] [phone] [facility]* (accepts non-insured)  [address] [place], [place] [address] [phone] Walk in Monday-Friday 8am-3pm [facility] [place]* (takes sliding scale and Medicaid as well as other major insurances)  [place]:   [address], [place]  Phone: [phone] [place]:   [facility] [address], [place], [place]  Phone: [phone] Walk in Monday-Friday 830am-230pm [facility]* (accepts  [employer] non-insured) [address] [place], [place] [address] [phone] Walk in Monday-Friday 8am-5pm For a mental health emergency call: Mobile [facility] [phone]  You will need to follow-up with [facility].  Return to the emergency department for new or worsening symptoms.

## 2019-06-26 ENCOUNTER — Emergency Department (HOSPITAL_COMMUNITY)
Admission: EM | Admit: 2019-06-26 | Discharge: 2019-06-26 | Discharge status: Absent without leave | Payer: Medicaid Other

## 2019-06-27 ENCOUNTER — Other Ambulatory Visit: Payer: Self-pay

## 2019-06-27 ENCOUNTER — Emergency Department (HOSPITAL_COMMUNITY)
Admission: EM | Admit: 2019-06-27 | Discharge: 2019-06-27 | Disposition: A | Discharge status: Home / Self Care | Payer: Medicaid Other | Attending: Emergency Medicine | Admitting: Emergency Medicine

## 2019-06-27 ENCOUNTER — Encounter (HOSPITAL_COMMUNITY): Payer: Self-pay | Admitting: Behavioral Health

## 2019-06-27 DIAGNOSIS — F250 Schizoaffective disorder, bipolar type: Secondary | ICD-10-CM | POA: Insufficient documentation

## 2019-06-27 DIAGNOSIS — Z590 Homelessness: Secondary | ICD-10-CM | POA: Insufficient documentation

## 2019-06-27 DIAGNOSIS — R4689 Other symptoms and signs involving appearance and behavior: Secondary | ICD-10-CM

## 2019-06-27 DIAGNOSIS — Z20822 Contact with and (suspected) exposure to covid-19: Secondary | ICD-10-CM | POA: Diagnosis not present

## 2019-06-27 DIAGNOSIS — R462 Strange and inexplicable behavior: Secondary | ICD-10-CM

## 2019-06-27 DIAGNOSIS — F329 Major depressive disorder, single episode, unspecified: Secondary | ICD-10-CM

## 2019-06-27 DIAGNOSIS — F32A Depression, unspecified: Secondary | ICD-10-CM

## 2019-06-27 LAB — CBC WITH DIFFERENTIAL/PLATELET
Abs Immature Granulocytes: 0.03 10*3/uL (ref 0.00–0.07)
Basophils Absolute: 0 10*3/uL (ref 0.0–0.1)
Basophils Relative: 1 %
Eosinophils Absolute: 0.1 10*3/uL (ref 0.0–0.5)
Eosinophils Relative: 3 %
HCT: 42 % (ref 36.0–46.0)
Hemoglobin: 13.4 g/dL (ref 12.0–15.0)
Immature Granulocytes: 1 %
Lymphocytes Relative: 40 %
Lymphs Abs: 1.7 10*3/uL (ref 0.7–4.0)
MCH: 27.2 pg (ref 26.0–34.0)
MCHC: 31.9 g/dL (ref 30.0–36.0)
MCV: 85.2 fL (ref 80.0–100.0)
Monocytes Absolute: 0.3 10*3/uL (ref 0.1–1.0)
Monocytes Relative: 7 %
Neutro Abs: 2.1 10*3/uL (ref 1.7–7.7)
Neutrophils Relative %: 48 %
Platelets: 329 10*3/uL (ref 150–400)
RBC: 4.93 MIL/uL (ref 3.87–5.11)
RDW: 14.4 % (ref 11.5–15.5)
WBC: 4.3 10*3/uL (ref 4.0–10.5)
nRBC: 0 % (ref 0.0–0.2)

## 2019-06-27 LAB — COMPREHENSIVE METABOLIC PANEL
ALT: 16 U/L (ref 0–44)
AST: 14 U/L — ABNORMAL LOW (ref 15–41)
Albumin: 3.7 g/dL (ref 3.5–5.0)
Alkaline Phosphatase: 89 U/L (ref 38–126)
Anion gap: 11 (ref 5–15)
BUN: 9 mg/dL (ref 6–20)
CO2: 24 mmol/L (ref 22–32)
Calcium: 9.3 mg/dL (ref 8.9–10.3)
Chloride: 99 mmol/L (ref 98–111)
Creatinine, Ser: 0.71 mg/dL (ref 0.44–1.00)
GFR calc Af Amer: >60 mL/min (ref >60)
GFR calc non Af Amer: >60 mL/min (ref >60)
Glucose, Bld: 332 mg/dL — ABNORMAL HIGH (ref 70–99)
Potassium: 3.8 mmol/L (ref 3.5–5.1)
Sodium: 134 mmol/L — ABNORMAL LOW (ref 135–145)
Total Bilirubin: 0.6 mg/dL (ref 0.3–1.2)
Total Protein: 7.3 g/dL (ref 6.5–8.1)

## 2019-06-27 LAB — HEMOGLOBIN A1C
Hgb A1c MFr Bld: 14 % — ABNORMAL HIGH (ref 4.8–5.6)
Mean Plasma Glucose: 355.1 mg/dL

## 2019-06-27 LAB — I-STAT BETA HCG BLOOD, ED (MC, WL, AP ONLY): I-stat hCG, quantitative: <5 m[IU]/mL (ref <5)

## 2019-06-27 LAB — RESPIRATORY PANEL BY RT PCR (FLU A&B, COVID)
Influenza A by PCR: NEGATIVE
Influenza B by PCR: NEGATIVE
SARS Coronavirus 2 by RT PCR: NEGATIVE

## 2019-06-27 LAB — SALICYLATE LEVEL: Salicylate Lvl: <7 mg/dL — ABNORMAL LOW (ref 7.0–30.0)

## 2019-06-27 LAB — ACETAMINOPHEN LEVEL: Acetaminophen (Tylenol), Serum: <10 ug/mL — ABNORMAL LOW (ref 10–30)

## 2019-06-27 LAB — CBG MONITORING, ED: Glucose-Capillary: 372 mg/dL — ABNORMAL HIGH (ref 70–99)

## 2019-06-27 LAB — ETHANOL: Alcohol, Ethyl (B): <10 mg/dL (ref <10)

## 2019-06-27 MED ORDER — LISINOPRIL 20 MG PO TABS
30.0000 mg | ORAL_TABLET | Freq: Every day | ORAL | Status: DC
  Administered 2019-06-27: 30 mg via ORAL
  Filled 2019-06-27: qty 1

## 2019-06-27 MED ORDER — GABAPENTIN 100 MG PO CAPS: 100.0000 mg | ORAL_CAPSULE | Freq: Every day | ORAL | Status: DC

## 2019-06-27 MED ORDER — BENZTROPINE MESYLATE 1 MG PO TABS
1.0000 mg | ORAL_TABLET | Freq: Every day | ORAL | Status: DC
  Administered 2019-06-27: 17:00:00 1 mg via ORAL
  Filled 2019-06-27: qty 1

## 2019-06-27 MED ORDER — SERTRALINE HCL 25 MG PO TABS
25.0000 mg | ORAL_TABLET | Freq: Every day | ORAL | Status: DC
  Administered 2019-06-27: 17:00:00 25 mg via ORAL
  Filled 2019-06-27: qty 1

## 2019-06-27 MED ORDER — INSULIN ASPART 100 UNIT/ML ~~LOC~~ SOLN
0.0000 [IU] | Freq: Three times a day (TID) | SUBCUTANEOUS | Status: DC
  Administered 2019-06-27: 17:00:00 9 [IU] via SUBCUTANEOUS

## 2019-06-27 MED ORDER — ZIPRASIDONE MESYLATE 20 MG IM SOLR: 10.0000 mg | Freq: Once | INTRAMUSCULAR | Status: AC

## 2019-06-27 MED ORDER — ZIPRASIDONE MESYLATE 20 MG IM SOLR
INTRAMUSCULAR | Status: AC
  Administered 2019-06-27: 11:00:00 10 mg via INTRAMUSCULAR
  Filled 2019-06-27: qty 20

## 2019-06-27 MED ORDER — PANTOPRAZOLE SODIUM 40 MG PO TBEC
40.0000 mg | DELAYED_RELEASE_TABLET | Freq: Every day | ORAL | Status: DC
  Administered 2019-06-27: 17:00:00 40 mg via ORAL
  Filled 2019-06-27: qty 1

## 2019-06-27 MED ORDER — ARIPIPRAZOLE 10 MG PO TABS
10.0000 mg | ORAL_TABLET | Freq: Every day | ORAL | Status: DC
  Administered 2019-06-27: 17:00:00 10 mg via ORAL
  Filled 2019-06-27: qty 1

## 2019-06-27 MED ORDER — METFORMIN HCL ER 750 MG PO TB24: 2000.0000 mg | ORAL_TABLET | Freq: Every morning | ORAL | Status: DC

## 2019-06-27 NOTE — ED Notes (Signed)
Pt arrived to Rm 51 via stretcher. Pt noted to be sleepy but wakens easily. Pt clapped hands and yelled out, "I need some water right now!" Water given. Pt aware of need for urine specimen. Pt given warm blankets as requested. Pt declined for head of stretcher to be lowered.

## 2019-06-27 NOTE — ED Notes (Addendum)
Pt awake - states she does not know why she is here in ED. Pt noted to be cursing, call staff names and threatening staff. Pt ambulated to bathroom - obtained urine specimen - slamming bathroom door - demanding to be given 500 sandwiches but states does not want [place] as she states she knows we have ham. Pt ambulated back to room. When staff attempted to obtain urine specimen from pt, pt unscrewed lid and threw urine on staff and threw Sprite on staff. Pt continuing to curse - stating she does not know why she is here and cursing at nurse for not waking her for TTS assessment as she wants to leave. RN and staff advised pt attempted to wake her - pt continues to deny this happening. Security called - Message sent to Dr Manus Gunning. BH SW also aware ready for TTS. Pt denies SI/HI. Dr Manus Gunning advised pt needs TTS prior to being d/c'd.
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
## 2019-06-27 NOTE — ED Notes (Signed)
Pt is refusing for vitals signs to be checked.
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
## 2019-06-27 NOTE — ED Notes (Signed)
Pt talking loudly. TTS completed - BH NP recommends for SW to see pt then for pt to be d/c'd. Dr Manus Gunning aware.

## 2019-06-27 NOTE — ED Notes (Signed)
PT brought to the ED by [employer], uncooperative, oriented, cold to touch, only wearing hat and [place]gs. She was dyed and warmed. After warm up pt start talking about "God forgot of her, and for that reason she walk on the street naked to they put attention her". She became cooperative and handcuff were removed but refuse to give information, start to scream to [employer] and got heated. After a few second she start scream again, she was siiting on her own stool for unknown time, as the conversation got heated she use her hand to grab her own stool and cover the wall on the room at emergency department. PT stat that she doit on purpose, clean her hands with blankets and was cooperative for put handcuff back on

## 2019-06-27 NOTE — ED Triage Notes (Signed)
Pt. Will NOT answer any questions. Trying to get her dressed into some clothing.

## 2019-06-27 NOTE — BH Assessment (Signed)
[facility] Assessment Progress Note   Pt sedated with Geodon, unable to be assessed

## 2019-06-27 NOTE — ED Notes (Signed)
Pt eating sandwich and Diet Sprite as requested. Pt asking for additional Diet Sprite - offered water - declined.

## 2019-06-27 NOTE — ED Notes (Signed)
Belongings inventoried and placed in locker 1 ?
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
## 2019-06-27 NOTE — ED Notes (Signed)
Dinner tray ordered.

## 2019-06-27 NOTE — ED Notes (Signed)
ALL belongings inventoried - 1 labeled belongings bag - placed in [place] [address]. No Valuables noted.

## 2019-06-27 NOTE — ED Notes (Signed)
TTS being performed.  

## 2019-06-27 NOTE — ED Triage Notes (Signed)
[employer] brought pt. In from [employer]. And was naked outside. [employer] stated, she spreaded her butt cheeks to them. Pt. Was here yesterday and seen by [facility] and discharged.

## 2019-06-27 NOTE — ED Notes (Addendum)
SW spoke w/pt and gave pt clothing. Pt attempting to change clothing w/door open to room - yelling for door to stay open while changing. Door closed. Pt stated she could not wear the clothing. Pt given blue scrubs. ALL belongings - 1 labeled belongings bag - returned to pt - Pt verified all items present - noted to be wet/soiled. Pt refusing for VS's to be taken. Pt aware and voices agreement w/tx plan - being d/c'd.

## 2019-06-29 ENCOUNTER — Encounter (HOSPITAL_COMMUNITY): Payer: Self-pay | Admitting: Family Medicine

## 2020-05-22 IMAGING — DX DG CHEST 2V
2 series · 2 of 2 positions shown · non-contrast
Comparison: September 29, 2018.

CLINICAL DATA: Shortness of breath.

EXAM:
CHEST - 2 VIEW

[w chest pa]
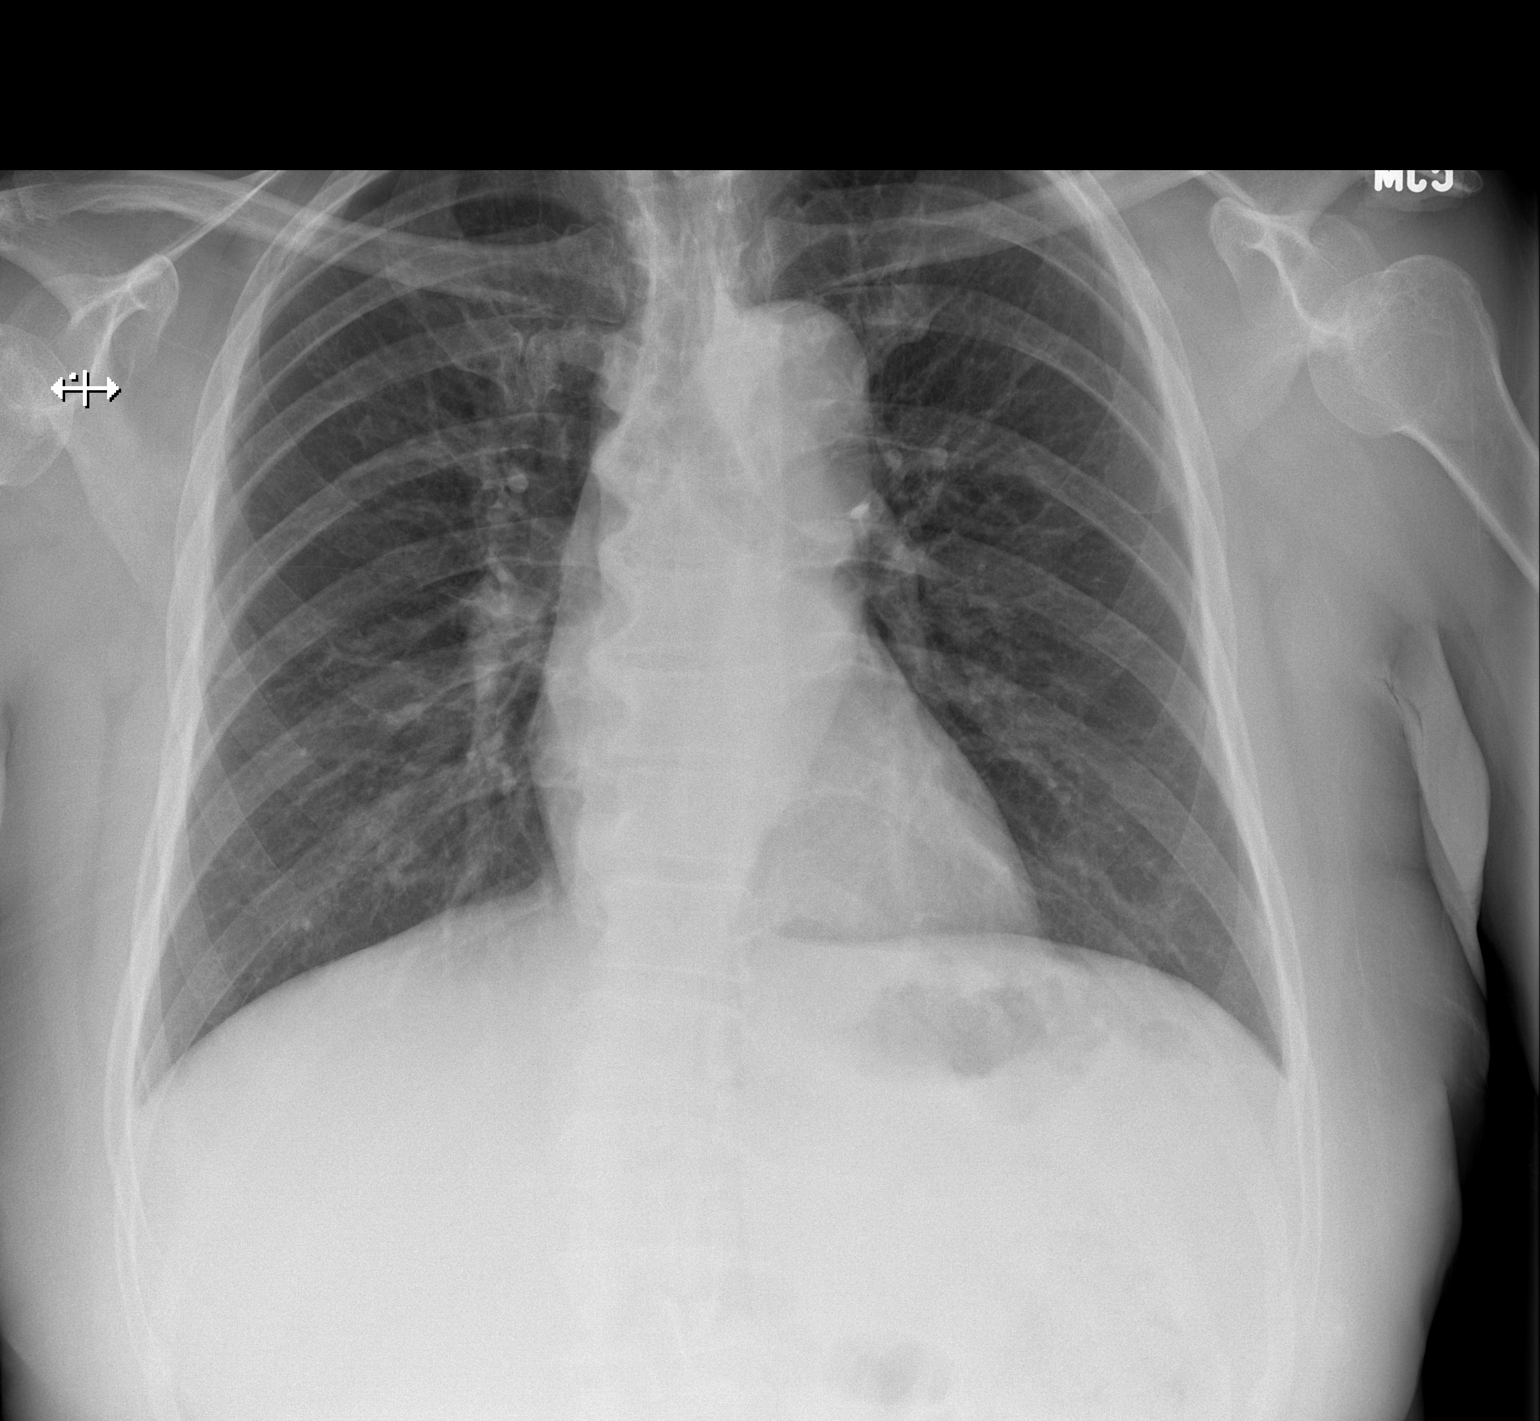

[w chest lat]
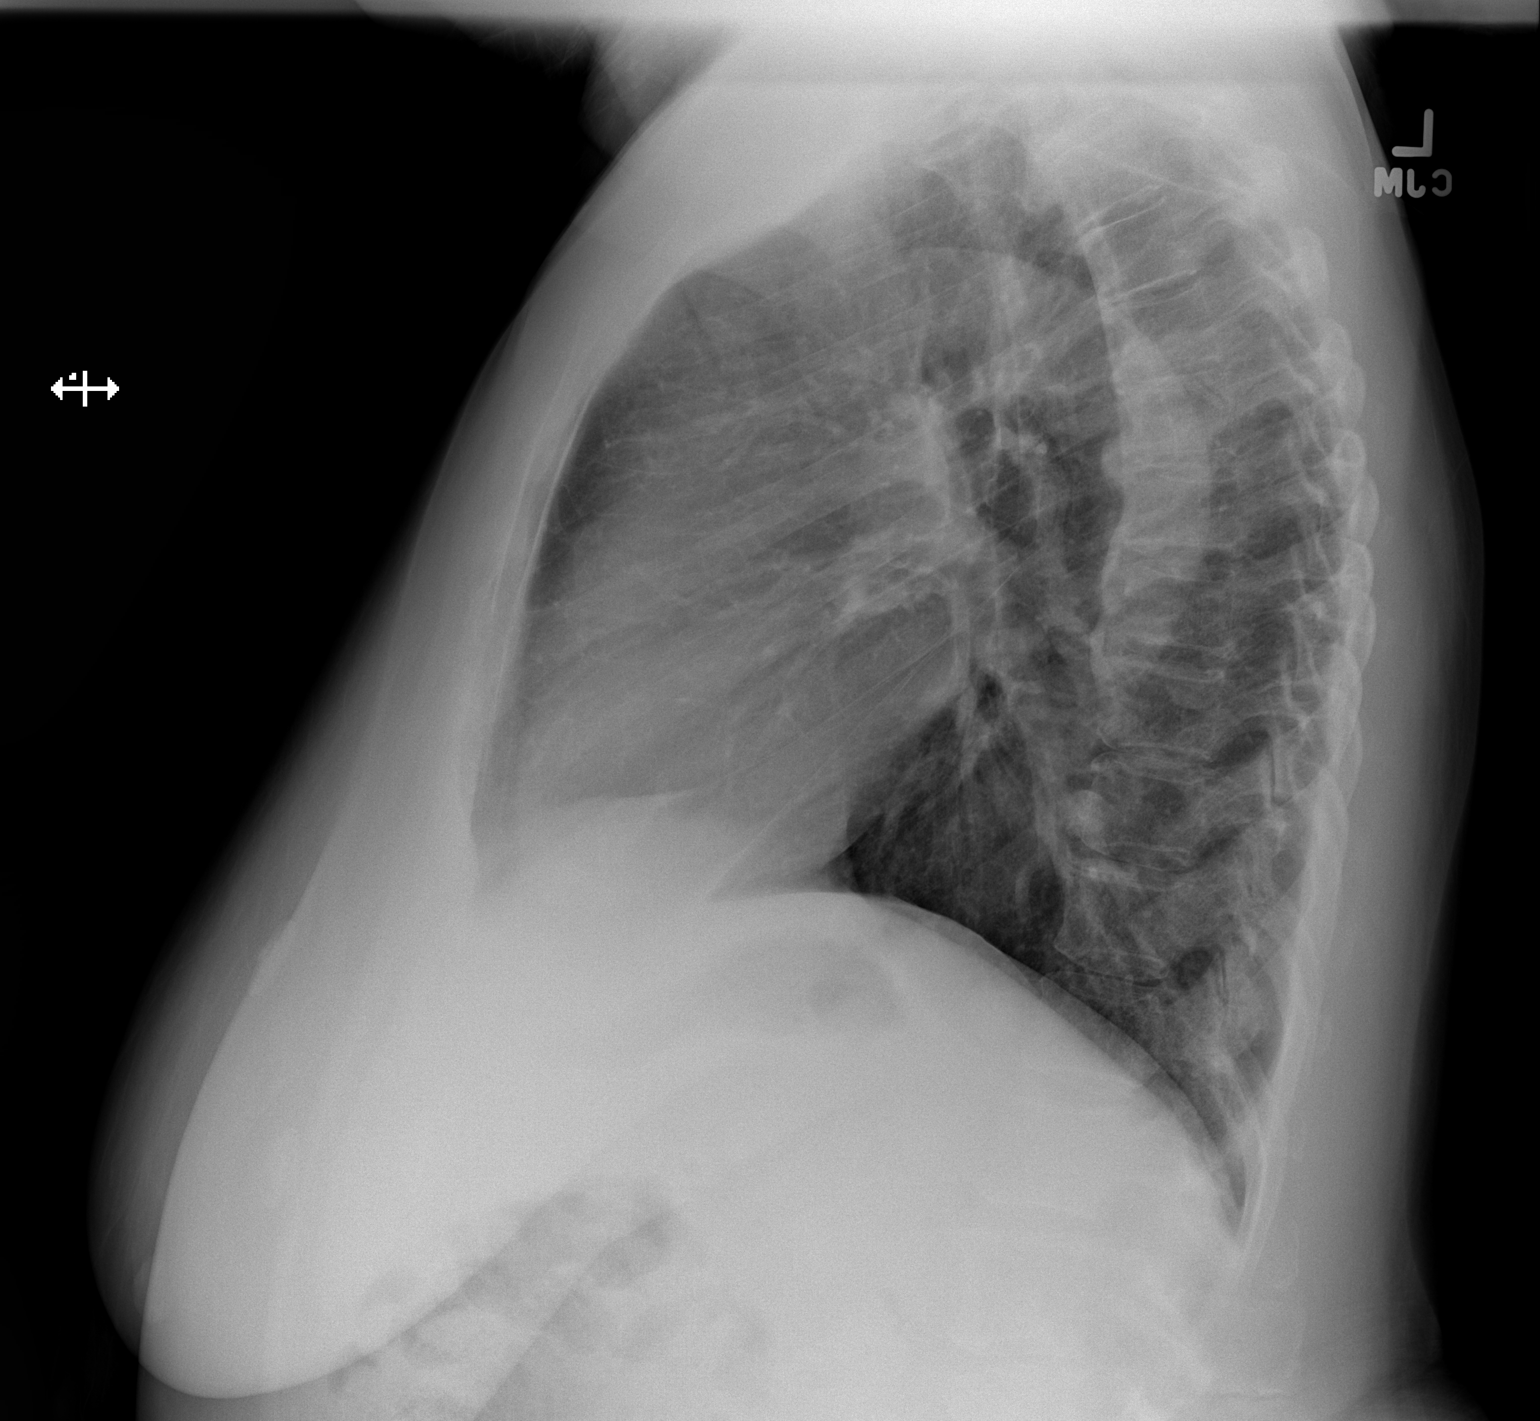

[2 of 2 positions shown; findings below may reference images not displayed]

FINDINGS: The heart size and mediastinal contours are within normal limits.
Both lungs are clear. No pneumothorax or pleural effusion is noted.
The visualized skeletal structures are unremarkable.
IMPRESSION: No active cardiopulmonary disease.

## 2020-06-08 IMAGING — CR DG FOOT COMPLETE 3+V*R*
3 series · 3 of 3 positions shown · non-contrast
Comparison: None.

CLINICAL DATA: Swelling and warmth to the touch. The pain is worse
along the lateral side of the foot proximal to the fourth and fifth
digits or she has a small wound.

EXAM:
RIGHT FOOT COMPLETE - 3+ VIEW

[foot ap]
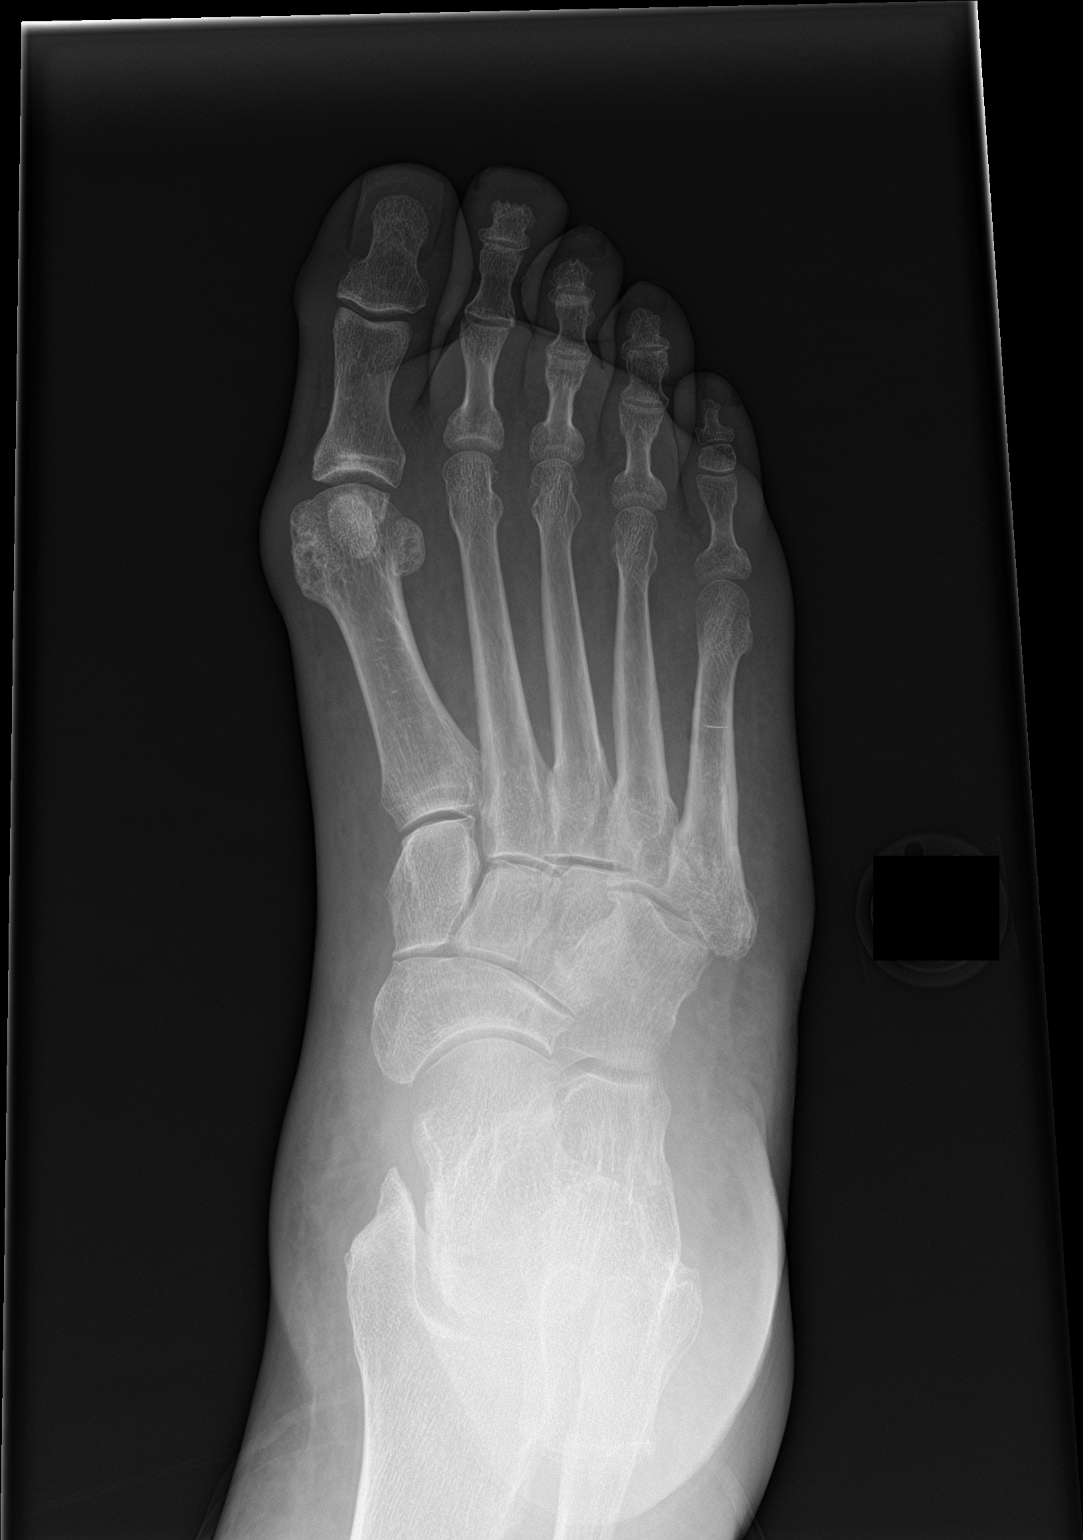

[foot obl]
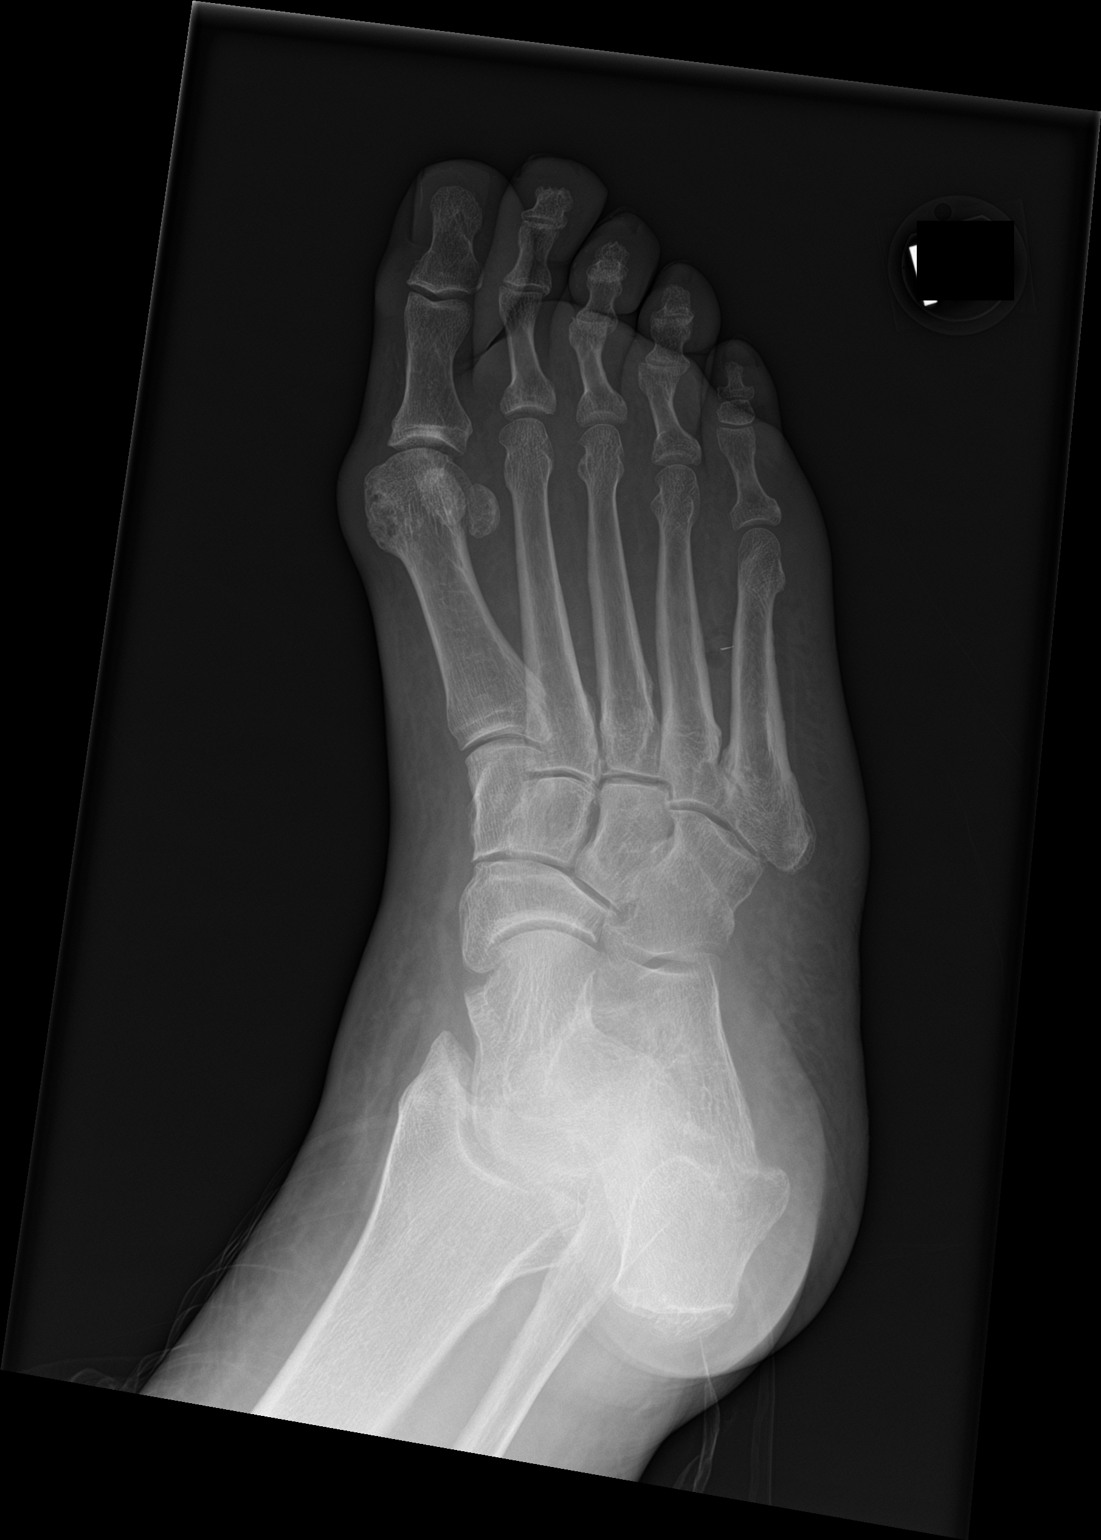

[foot lat]
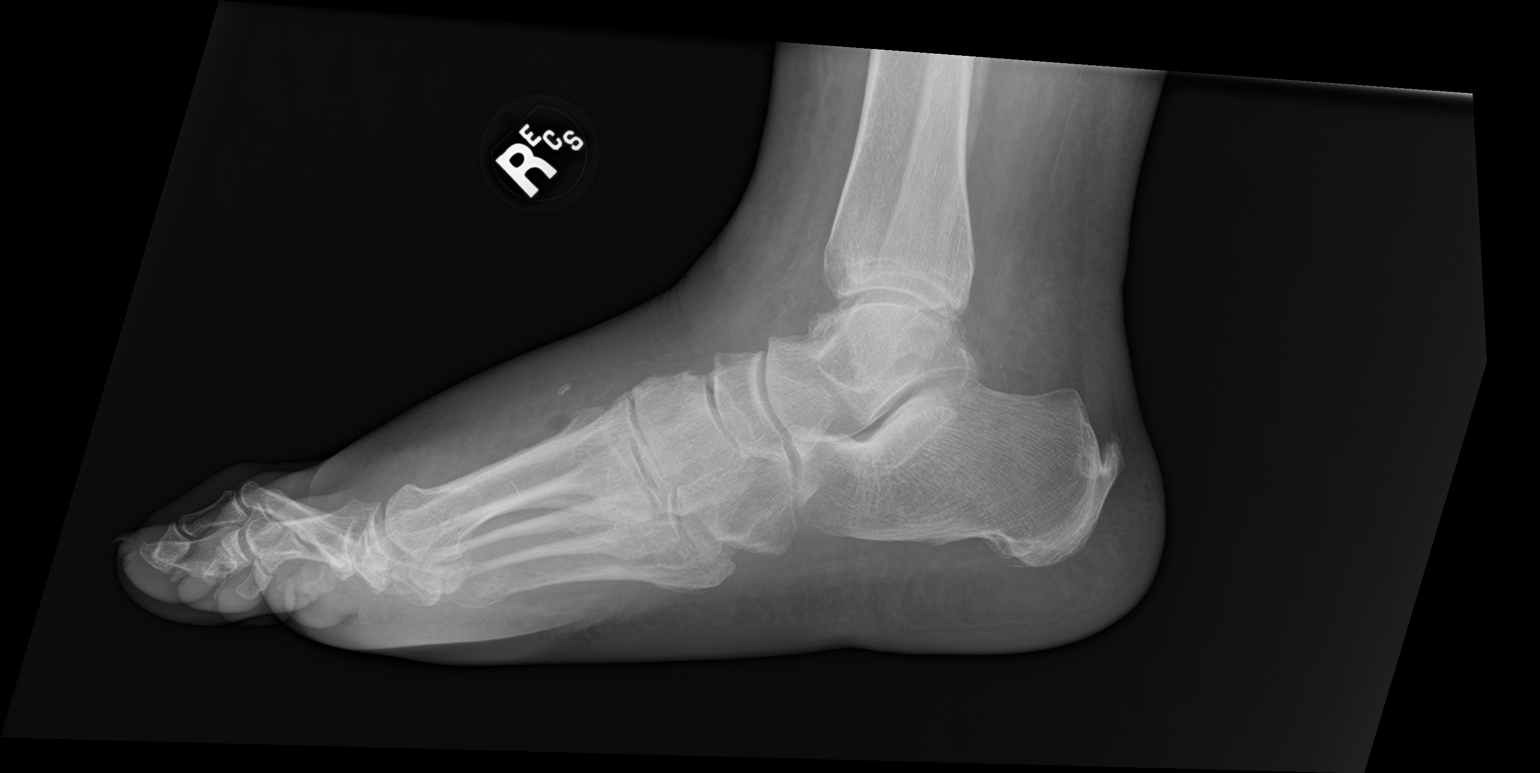

[3 of 3 positions shown; findings below may reference images not displayed]

FINDINGS: A linear density projects over the fifth metatarsal on the AP view
in between the fourth and fifth metatarsals on the oblique view.
Soft tissue swelling is identified, particularly in the region of
this linear density. Oval lucency in the soft tissue thickening
could represent soft tissue gas. No fractures are seen. No bony
erosion identified.
IMPRESSION: 1. A linear density in the soft tissues over the lateral right foot
is identified, consistent with a foreign body. Given history, this
foreign body is likely acute. There is soft tissue swelling in this
region of the foot. There is an oval region of relative lucency in
the soft tissues which could represent soft tissue gas. If this does
represent gas, it could have been introduced from the small wound
described in the history or from an infection secondary to a
gas-forming organism. Recommend clinical correlation.

## 2022-01-20 ENCOUNTER — Encounter (HOSPITAL_COMMUNITY): Payer: Self-pay | Admitting: Emergency Medicine

## 2022-01-20 ENCOUNTER — Emergency Department (HOSPITAL_COMMUNITY)
Admission: EM | Admit: 2022-01-20 | Discharge: 2022-01-20 | Discharge status: Against Medical Advice | Payer: Medicaid Other | Source: Home / Self Care

## 2022-01-20 ENCOUNTER — Other Ambulatory Visit: Payer: Self-pay

## 2022-01-20 ENCOUNTER — Encounter (HOSPITAL_COMMUNITY): Payer: Self-pay

## 2022-01-20 ENCOUNTER — Emergency Department (HOSPITAL_COMMUNITY)
Admission: EM | Admit: 2022-01-20 | Discharge: 2022-01-20 | Disposition: A | Discharge status: Home / Self Care | Payer: Medicaid Other | Attending: Emergency Medicine | Admitting: Emergency Medicine

## 2022-01-20 DIAGNOSIS — F17210 Nicotine dependence, cigarettes, uncomplicated: Secondary | ICD-10-CM | POA: Insufficient documentation

## 2022-01-20 DIAGNOSIS — Z5900 Homelessness unspecified: Secondary | ICD-10-CM | POA: Insufficient documentation

## 2022-01-20 DIAGNOSIS — Z5321 Procedure and treatment not carried out due to patient leaving prior to being seen by health care provider: Secondary | ICD-10-CM | POA: Diagnosis not present

## 2022-01-20 DIAGNOSIS — R443 Hallucinations, unspecified: Secondary | ICD-10-CM | POA: Diagnosis not present

## 2022-01-20 DIAGNOSIS — Z91040 Latex allergy status: Secondary | ICD-10-CM | POA: Insufficient documentation

## 2022-01-20 DIAGNOSIS — F1990 Other psychoactive substance use, unspecified, uncomplicated: Secondary | ICD-10-CM

## 2022-01-20 DIAGNOSIS — F1410 Cocaine abuse, uncomplicated: Secondary | ICD-10-CM | POA: Diagnosis present

## 2022-01-20 DIAGNOSIS — Z79899 Other long term (current) drug therapy: Secondary | ICD-10-CM | POA: Insufficient documentation

## 2022-01-20 DIAGNOSIS — Z7984 Long term (current) use of oral hypoglycemic drugs: Secondary | ICD-10-CM | POA: Diagnosis not present

## 2022-01-20 DIAGNOSIS — I10 Essential (primary) hypertension: Secondary | ICD-10-CM | POA: Diagnosis not present

## 2022-01-20 DIAGNOSIS — E1165 Type 2 diabetes mellitus with hyperglycemia: Secondary | ICD-10-CM | POA: Diagnosis not present

## 2022-01-20 DIAGNOSIS — B351 Tinea unguium: Secondary | ICD-10-CM | POA: Insufficient documentation

## 2022-01-20 DIAGNOSIS — F14988 Cocaine use, unspecified with other cocaine-induced disorder: Secondary | ICD-10-CM | POA: Diagnosis not present

## 2022-01-20 DIAGNOSIS — R739 Hyperglycemia, unspecified: Secondary | ICD-10-CM

## 2022-01-20 LAB — COMPREHENSIVE METABOLIC PANEL
ALT: 17 U/L (ref 0–44)
AST: 17 U/L (ref 15–41)
Albumin: 3.9 g/dL (ref 3.5–5.0)
Alkaline Phosphatase: 86 U/L (ref 38–126)
Anion gap: 11 (ref 5–15)
BUN: 22 mg/dL — ABNORMAL HIGH (ref 6–20)
CO2: 24 mmol/L (ref 22–32)
Calcium: 9.6 mg/dL (ref 8.9–10.3)
Chloride: 101 mmol/L (ref 98–111)
Creatinine, Ser: 0.87 mg/dL (ref 0.44–1.00)
GFR, Estimated: >60 mL/min (ref >60)
Glucose, Bld: 358 mg/dL — ABNORMAL HIGH (ref 70–99)
Potassium: 3.9 mmol/L (ref 3.5–5.1)
Sodium: 136 mmol/L (ref 135–145)
Total Bilirubin: 0.5 mg/dL (ref 0.3–1.2)
Total Protein: 7.6 g/dL (ref 6.5–8.1)

## 2022-01-20 LAB — CBC
HCT: 36.4 % (ref 36.0–46.0)
Hemoglobin: 11.3 g/dL — ABNORMAL LOW (ref 12.0–15.0)
MCH: 27.6 pg (ref 26.0–34.0)
MCHC: 31 g/dL (ref 30.0–36.0)
MCV: 88.8 fL (ref 80.0–100.0)
Platelets: 340 10*3/uL (ref 150–400)
RBC: 4.1 MIL/uL (ref 3.87–5.11)
RDW: 14.1 % (ref 11.5–15.5)
WBC: 5.6 10*3/uL (ref 4.0–10.5)
nRBC: 0 % (ref 0.0–0.2)

## 2022-01-20 LAB — RAPID URINE DRUG SCREEN, HOSP PERFORMED
Amphetamines: NOT DETECTED
Barbiturates: NOT DETECTED
Benzodiazepines: NOT DETECTED
Cocaine: POSITIVE — AB
Opiates: NOT DETECTED
Tetrahydrocannabinol: NOT DETECTED

## 2022-01-20 LAB — I-STAT BETA HCG BLOOD, ED (MC, WL, AP ONLY): I-stat hCG, quantitative: <5 m[IU]/mL (ref <5)

## 2022-01-20 LAB — ETHANOL: Alcohol, Ethyl (B): <10 mg/dL (ref <10)

## 2022-01-20 NOTE — ED Triage Notes (Addendum)
Patient arrived with EMS from street ( homeless) requesting detox treatment for her Crack Cocaine/Fentanyl addiction , CBG=419 , patient added pain/calluses at both feet from walking . No SI or HI , denies hallucinations .

## 2022-01-20 NOTE — ED Provider Notes (Signed)
[facility] EMERGENCY DEPARTMENT Provider Note   CSN: [id number] Arrival date & time: 01/20/22  0998     History PMH of diabetes, hypertension, polysubstance use disorder  Chief Complaint  Patient presents with   Detox: Crack Cocaine/Fentanyl    Hyperglycemia    Katherine Franklin is a 56 y.o. female. Patient here with multiple complaints.  First off she is requesting detox from crack and cocaine and fentanyl addition.  She denies any suicidal or homicidal ideations.  Denies hallucinations.  Is also concerned about her hyperglycemia.  She has no symptoms at this time.  She also complains of problems in both of her feet and feels like her toes are falling off.  She denies any fevers, severe pain, or other symptoms.  HPI     Home Medications Prior to Admission medications   Medication Sig Start Date End Date Taking? Authorizing Provider  ARIPiprazole (ABILIFY) 10 MG tablet Take 1 tablet (10 mg total) by mouth daily. 06/05/19   Starkes-Perry, Juel Burrow, FNP  ARIPiprazole (ABILIFY) 10 MG tablet Take 10 mg by mouth daily. 06/05/19   [provider]  benztropine (COGENTIN) 1 MG tablet Take 1 tablet (1 mg total) by mouth daily. 06/05/19 07/05/19  Maryagnes Amos, FNP  benztropine (COGENTIN) 1 MG tablet Take 1 mg by mouth daily. 06/05/19   [provider]  clotrimazole (LOTRIMIN) 1 % cream Apply 1 application topically 2 (two) times daily. 14 day course started 05/31/2019 05/31/19   [provider]  gabapentin (NEURONTIN) 100 MG capsule Take 100 mg by mouth at bedtime. 05/31/19   [provider]  gabapentin (NEURONTIN) 100 MG capsule Take 100 mg by mouth at bedtime. 05/31/19   [provider]  lisinopril (ZESTRIL) 20 MG tablet Take 1 tablet (20 mg total) by mouth daily. 04/28/19   Antonieta Pert, MD  lisinopril (ZESTRIL) 20 MG tablet Take 30 mg by mouth daily. 05/31/19   [provider]  metFORMIN (GLUCOPHAGE-XR) 500 MG  24 hr tablet Take 2,000 mg by mouth every morning. 05/31/19   [provider]  metFORMIN (GLUCOPHAGE-XR) 500 MG 24 hr tablet Take 2,000 mg by mouth every morning. 05/31/19   [provider]  pantoprazole (PROTONIX) 40 MG tablet Take 40 mg by mouth daily. 05/31/19   [provider]  pantoprazole (PROTONIX) 40 MG tablet Take 40 mg by mouth daily. 05/31/19   [provider]  sertraline (ZOLOFT) 25 MG tablet Take 1 tablet (25 mg total) by mouth daily. 06/05/19   Starkes-Perry, Juel Burrow, FNP  sertraline (ZOLOFT) 25 MG tablet Take 25 mg by mouth daily. 06/05/19   [provider]  sertraline (ZOLOFT) 50 MG tablet Take 50 mg by mouth daily. 05/31/19   [provider]      Allergies    Sulfa antibiotics, Sulfa antibiotics, Latex, and Latex    Review of Systems   Review of Systems  Skin:  Positive for color change.  All other systems reviewed and are negative.   Physical Exam Updated Vital Signs BP (!) 161/93   Pulse 97   Temp 98.7 F (37.1 C) (Oral)   Resp 16   SpO2 96%  Physical Exam Vitals and nursing note reviewed.  Constitutional:      General: She is not in acute distress.    Appearance: Normal appearance. She is well-developed. She is not ill-appearing, toxic-appearing or diaphoretic.  HENT:     Head: Normocephalic and atraumatic.     Nose: No  nasal deformity.     Mouth/Throat:     Lips: Pink. No lesions.     Mouth: Mucous membranes are moist.     Pharynx: Oropharynx is clear. No oropharyngeal exudate.  Eyes:     General: Gaze aligned appropriately. No scleral icterus.       Right eye: No discharge.        Left eye: No discharge.     Conjunctiva/sclera: Conjunctivae normal.     Right eye: Right conjunctiva is not injected. No exudate or hemorrhage.    Left eye: Left conjunctiva is not injected. No exudate or hemorrhage. Cardiovascular:     Rate and Rhythm: Normal rate and regular rhythm.     Pulses: Normal pulses.     Heart  sounds: Normal heart sounds. No murmur heard.    No friction rub. No gallop.  Pulmonary:     Effort: Pulmonary effort is normal. No respiratory distress.     Breath sounds: Normal breath sounds. No wheezing, rhonchi or rales.  Abdominal:     General: Abdomen is flat.     Palpations: Abdomen is soft.     Tenderness: There is no abdominal tenderness. There is no guarding or rebound.  Skin:    General: Skin is warm and dry.     Comments: Toes with severe onchomychosis. Warm to the touch.      Neurological:     Mental Status: She is alert and oriented to person, place, and time.  Psychiatric:        Mood and Affect: Mood normal.        Speech: Speech normal.        Behavior: Behavior normal. Behavior is cooperative.     ED Results / Procedures / Treatments   Labs (all labs ordered are listed, but only abnormal results are displayed) Labs Reviewed  COMPREHENSIVE METABOLIC PANEL - Abnormal; Notable for the following components:      Result Value   Glucose, Bld 358 (*)    BUN 22 (*)    All other components within normal limits  CBC - Abnormal; Notable for the following components:   Hemoglobin 11.3 (*)    All other components within normal limits  RAPID URINE DRUG SCREEN, HOSP PERFORMED - Abnormal; Notable for the following components:   Cocaine POSITIVE (*)    All other components within normal limits  ETHANOL  I-STAT BETA HCG BLOOD, ED ([facility], [facility], [facility] ONLY)    EKG None  Radiology No results found.  Procedures Procedures   Medications Ordered in ED Medications - No data to display  ED Course/ Medical Decision Making/ A&P                           Medical Decision Making  Patient here for detox from her crack cocaine addiction.  She is not suicidal or homicidal.  I have told her that we do not offer this service at this hospital.  I provided her with resources.  She expresses understanding.  She also has concerns about her hyperglycemia.  Blood sugar initially was  over 400, but on repeat CMP is 358.  It appears that her prior labs, this is normal for her.  She is not on home medication.  She has no symptoms to suggest hyperglycemic crisis.  She is not in DKA.  I do not feel that we need to treat this at this time.  Recommend p.o. fluids.  Recommend follow-up outpatient.  I also evaluated her feet which looks like she has severe onychomycosis.  I recommend podiatry follow-up.  Final Clinical Impression(s) / ED Diagnoses Final diagnoses:  Onychomycosis  Substance use disorder  Hyperglycemia    Rx / DC Orders ED Discharge Orders     None         Claudie Leach, PA-C 01/20/22 1443    Derwood Kaplan, MD 01/20/22 [phone]

## 2022-01-20 NOTE — Discharge Instructions (Addendum)
-   Please call Dr. Ardelle Anton to have your feet looked at - Please contact one of the substance use facilities to start your detox    Substance Use/ Detox Facilities  1. [facility] Address: [address] [place], [place] [address]  [phone]  2. [facility]  Address: [phone] [place] [address] Phone: [phone]  3. [facility] Address: [address] [place] [address]  Phone [phone]  4. [facility]   [facility]/ [facility] Location Address: [address] Phone: [phone]  [facility] Location  Address: [address] [place] [address]  [phone]  [facility] Location Address: [address], [place], [place] [address] Phone:[phone]  5. [facility]- [address] [place] [address]

## 2022-01-20 NOTE — ED Triage Notes (Signed)
Reports she smoked a cigarette and it was strong now he is hallucinating.  Patient is homeless complains of feet pain and that she drank a soda "so I'm fine".

## 2022-01-20 NOTE — ED Notes (Signed)
Patient refused vital signs and stated "I'm leaving there is nothing wrong with me" and walked out of triage.

## 2022-01-21 ENCOUNTER — Emergency Department (HOSPITAL_COMMUNITY)
Admission: EM | Admit: 2022-01-21 | Discharge: 2022-01-21 | Discharge status: Against Medical Advice | Payer: Medicaid Other | Attending: Emergency Medicine | Admitting: Emergency Medicine

## 2022-01-21 ENCOUNTER — Other Ambulatory Visit: Payer: Self-pay

## 2022-01-21 ENCOUNTER — Encounter (HOSPITAL_COMMUNITY): Payer: Self-pay

## 2022-01-21 DIAGNOSIS — Z5321 Procedure and treatment not carried out due to patient leaving prior to being seen by health care provider: Secondary | ICD-10-CM | POA: Insufficient documentation

## 2022-01-21 DIAGNOSIS — Z131 Encounter for screening for diabetes mellitus: Secondary | ICD-10-CM | POA: Insufficient documentation

## 2022-01-21 LAB — CBG MONITORING, ED: Glucose-Capillary: 422 mg/dL — ABNORMAL HIGH (ref 70–99)

## 2022-01-21 NOTE — ED Notes (Signed)
Pt removed by security.

## 2022-01-21 NOTE — Care Management (Signed)
Patient just received resources yesterday for SA, will add to DC instructions again. Needs to make ana appointment with [facility] health and wellness, or [facility] on Monday

## 2022-01-21 NOTE — Discharge Instructions (Signed)
                  Intensive Outpatient Programs  [place] [facility]    [facility] [address]     [address] [place],  [place]     [place], [place] [phone]      [phone]  [facility]   [facility]  (Inpatient and outpatient)  [phone] (Suboxone and Methadone) [address]           [phone]           [facility]    [facility] - Intensive Outpatient [address]     [address] [place], [place] [address]     [place], [place]  [phone]      [phone]  [facility] (Outpatient, Inpatient, [facility]  [facility] (Groups and Residental) (insurance only) [phone]    [place], [place]          [phone]       [facility]    [facility] (for caregivers and family) [address]     [address] [place], [place]     [place], [place] [phone]      [phone]  Residential Treatment Programs  [facility]  [facility](2 years) Residential: 90 days)  [facility] [address]      [address] [place], [place]     [place], [place] [phone]      [phone] or [phone]  [facility]    [facility] [address]      [address] [place], [place]     [place], [place] [phone]      [phone]  [facility]   [facility] ([facility]) [address]     [address] [place], [place] [address]     [place], [place] [phone]      [phone] Admissions: 8am-3pm M-F  [facility]: Residential Program (90 Days)              [facility]: [facility]  [place], [place]     [place],   [phone] or [phone]    (Walk in Hours over the weekend or by referral)   [facility]: [facility]:[phone] (for crisis  response 24 hours a day) 

## 2022-01-21 NOTE — ED Triage Notes (Signed)
Patient wants blood sugar checked and also concerned about blistered toes.

## 2022-01-23 ENCOUNTER — Emergency Department (HOSPITAL_COMMUNITY)
Admission: EM | Admit: 2022-01-23 | Discharge: 2022-01-23 | Discharge status: Against Medical Advice | Payer: Medicaid Other | Attending: Emergency Medicine | Admitting: Emergency Medicine

## 2022-01-23 ENCOUNTER — Encounter (HOSPITAL_COMMUNITY): Payer: Self-pay

## 2022-01-23 ENCOUNTER — Other Ambulatory Visit: Payer: Self-pay

## 2022-01-23 DIAGNOSIS — Z7984 Long term (current) use of oral hypoglycemic drugs: Secondary | ICD-10-CM | POA: Insufficient documentation

## 2022-01-23 DIAGNOSIS — Z5321 Procedure and treatment not carried out due to patient leaving prior to being seen by health care provider: Secondary | ICD-10-CM

## 2022-01-23 DIAGNOSIS — Z79899 Other long term (current) drug therapy: Secondary | ICD-10-CM | POA: Diagnosis not present

## 2022-01-23 DIAGNOSIS — E1165 Type 2 diabetes mellitus with hyperglycemia: Secondary | ICD-10-CM | POA: Diagnosis not present

## 2022-01-23 DIAGNOSIS — Z91040 Latex allergy status: Secondary | ICD-10-CM | POA: Insufficient documentation

## 2022-01-23 DIAGNOSIS — R739 Hyperglycemia, unspecified: Secondary | ICD-10-CM | POA: Diagnosis present

## 2022-01-23 LAB — CBG MONITORING, ED: Glucose-Capillary: 303 mg/dL — ABNORMAL HIGH (ref 70–99)

## 2022-01-23 NOTE — ED Provider Notes (Signed)
[facility]-EMERGENCY DEPT Provider Note   CSN: [id number] Arrival date & time: 01/23/22  0026     History  Chief Complaint  Patient presents with   Hyperglycemia    Katherine Franklin is a 56 y.o. female with medical history of diabetes, medication noncompliance.  Patient presents to ED for hyperglycemia.  When I went to examine this patient, she was no longer in the room.  Nursing staff advised me that this patient had eloped.   Hyperglycemia      Home Medications Prior to Admission medications   Medication Sig Start Date End Date Taking? Authorizing Provider  ARIPiprazole (ABILIFY) 10 MG tablet Take 1 tablet (10 mg total) by mouth daily. 06/05/19   Starkes-Perry, Juel Burrow, FNP  ARIPiprazole (ABILIFY) 10 MG tablet Take 10 mg by mouth daily. 06/05/19   [provider]  benztropine (COGENTIN) 1 MG tablet Take 1 tablet (1 mg total) by mouth daily. 06/05/19 07/05/19  Maryagnes Amos, FNP  benztropine (COGENTIN) 1 MG tablet Take 1 mg by mouth daily. 06/05/19   [provider]  clotrimazole (LOTRIMIN) 1 % cream Apply 1 application topically 2 (two) times daily. 14 day course started 05/31/2019 05/31/19   [provider]  gabapentin (NEURONTIN) 100 MG capsule Take 100 mg by mouth at bedtime. 05/31/19   [provider]  gabapentin (NEURONTIN) 100 MG capsule Take 100 mg by mouth at bedtime. 05/31/19   [provider]  lisinopril (ZESTRIL) 20 MG tablet Take 1 tablet (20 mg total) by mouth daily. 04/28/19   Antonieta Pert, MD  lisinopril (ZESTRIL) 20 MG tablet Take 30 mg by mouth daily. 05/31/19   [provider]  metFORMIN (GLUCOPHAGE-XR) 500 MG 24 hr tablet Take 2,000 mg by mouth every morning. 05/31/19   [provider]  metFORMIN (GLUCOPHAGE-XR) 500 MG 24 hr tablet Take 2,000 mg by mouth every morning. 05/31/19   [provider]  pantoprazole (PROTONIX) 40 MG tablet Take 40 mg by mouth daily. 05/31/19    [provider]  pantoprazole (PROTONIX) 40 MG tablet Take 40 mg by mouth daily. 05/31/19   [provider]  sertraline (ZOLOFT) 25 MG tablet Take 1 tablet (25 mg total) by mouth daily. 06/05/19   Starkes-Perry, Juel Burrow, FNP  sertraline (ZOLOFT) 25 MG tablet Take 25 mg by mouth daily. 06/05/19   [provider]  sertraline (ZOLOFT) 50 MG tablet Take 50 mg by mouth daily. 05/31/19   [provider]      Allergies    Sulfa antibiotics, Sulfa antibiotics, Latex, and Latex    Review of Systems   Review of Systems  Unable to perform ROS: Other    Physical Exam Updated Vital Signs BP (!) 141/79 (BP Location: Left Arm)   Pulse 91   Temp 99.1 F (37.3 C) (Oral)   Resp 18   Ht 5\' 9"  (1.753 m)   Wt 104 kg   SpO2 99%   BMI 33.86 kg/m  Physical Exam  ED Results / Procedures / Treatments   Labs (all labs ordered are listed, but only abnormal results are displayed) Labs Reviewed  CBG MONITORING, ED - Abnormal; Notable for the following components:      Result Value   Glucose-Capillary 303 (*)    All other components within normal limits    EKG None  Radiology No results found.  Procedures Procedures   Medications Ordered in ED Medications - No data to display  ED Course/ Medical Decision  Making/ A&P                           Medical Decision Making  This patient eloped prior to me seeing her.  I never examined or evaluated this patient.   Final Clinical Impression(s) / ED Diagnoses Final diagnoses:  Patient left without being seen    Rx / DC Orders ED Discharge Orders     None         Clent Ridges 01/23/22 0825    Lorre Nick, MD 01/24/22 [phone]

## 2022-01-23 NOTE — ED Triage Notes (Signed)
BIBA for cbg check.  Cbg with ems 240.  Reports using cocaine today.  A&O x4 and ambulatory on arrival.

## 2022-01-26 ENCOUNTER — Emergency Department (HOSPITAL_COMMUNITY)
Admission: EM | Admit: 2022-01-26 | Discharge: 2022-01-27 | Disposition: A | Discharge status: Home / Self Care | Payer: Medicaid Other | Source: Home / Self Care | Attending: Emergency Medicine | Admitting: Emergency Medicine

## 2022-01-26 ENCOUNTER — Other Ambulatory Visit: Payer: Self-pay

## 2022-01-26 ENCOUNTER — Encounter (HOSPITAL_COMMUNITY): Payer: Self-pay

## 2022-01-26 ENCOUNTER — Emergency Department (HOSPITAL_COMMUNITY)
Admission: EM | Admit: 2022-01-26 | Discharge: 2022-01-26 | Discharge status: Against Medical Advice | Payer: Medicaid Other | Attending: Emergency Medicine | Admitting: Emergency Medicine

## 2022-01-26 DIAGNOSIS — Z91040 Latex allergy status: Secondary | ICD-10-CM | POA: Insufficient documentation

## 2022-01-26 DIAGNOSIS — Z7984 Long term (current) use of oral hypoglycemic drugs: Secondary | ICD-10-CM | POA: Diagnosis not present

## 2022-01-26 DIAGNOSIS — Z79899 Other long term (current) drug therapy: Secondary | ICD-10-CM | POA: Insufficient documentation

## 2022-01-26 DIAGNOSIS — R609 Edema, unspecified: Secondary | ICD-10-CM | POA: Diagnosis not present

## 2022-01-26 DIAGNOSIS — E119 Type 2 diabetes mellitus without complications: Secondary | ICD-10-CM | POA: Insufficient documentation

## 2022-01-26 DIAGNOSIS — R7309 Other abnormal glucose: Secondary | ICD-10-CM | POA: Diagnosis not present

## 2022-01-26 DIAGNOSIS — R1110 Vomiting, unspecified: Secondary | ICD-10-CM | POA: Diagnosis present

## 2022-01-26 DIAGNOSIS — Z5321 Procedure and treatment not carried out due to patient leaving prior to being seen by health care provider: Secondary | ICD-10-CM | POA: Insufficient documentation

## 2022-01-26 LAB — COMPREHENSIVE METABOLIC PANEL
ALT: 16 U/L (ref 0–44)
AST: 13 U/L — ABNORMAL LOW (ref 15–41)
Albumin: 3.7 g/dL (ref 3.5–5.0)
Alkaline Phosphatase: 101 U/L (ref 38–126)
Anion gap: 8 (ref 5–15)
BUN: 19 mg/dL (ref 6–20)
CO2: 27 mmol/L (ref 22–32)
Calcium: 9.3 mg/dL (ref 8.9–10.3)
Chloride: 97 mmol/L — ABNORMAL LOW (ref 98–111)
Creatinine, Ser: 0.79 mg/dL (ref 0.44–1.00)
GFR, Estimated: >60 mL/min (ref >60)
Glucose, Bld: 356 mg/dL — ABNORMAL HIGH (ref 70–99)
Potassium: 3.9 mmol/L (ref 3.5–5.1)
Sodium: 132 mmol/L — ABNORMAL LOW (ref 135–145)
Total Bilirubin: 0.5 mg/dL (ref 0.3–1.2)
Total Protein: 7.7 g/dL (ref 6.5–8.1)

## 2022-01-26 LAB — CBC
HCT: 36.2 % (ref 36.0–46.0)
Hemoglobin: 11.9 g/dL — ABNORMAL LOW (ref 12.0–15.0)
MCH: 28.3 pg (ref 26.0–34.0)
MCHC: 32.9 g/dL (ref 30.0–36.0)
MCV: 86.2 fL (ref 80.0–100.0)
Platelets: 386 10*3/uL (ref 150–400)
RBC: 4.2 MIL/uL (ref 3.87–5.11)
RDW: 13.3 % (ref 11.5–15.5)
WBC: 5.7 10*3/uL (ref 4.0–10.5)
nRBC: 0 % (ref 0.0–0.2)

## 2022-01-26 LAB — CBG MONITORING, ED: Glucose-Capillary: 369 mg/dL — ABNORMAL HIGH (ref 70–99)

## 2022-01-26 LAB — LIPASE, BLOOD: Lipase: 36 U/L (ref 11–51)

## 2022-01-26 NOTE — ED Triage Notes (Signed)
[place] [facility] [place] with c/o vomiting after trespassing. Glucose 404 - hx of hyperglycemia.

## 2022-01-26 NOTE — ED Notes (Signed)
Sent an additional tube of dark green to the lab

## 2022-01-26 NOTE — ED Triage Notes (Signed)
Ambulatory to ED with c/o swollen toes.

## 2022-01-27 LAB — BASIC METABOLIC PANEL
Anion gap: 6 (ref 5–15)
BUN: 23 mg/dL — ABNORMAL HIGH (ref 6–20)
CO2: 26 mmol/L (ref 22–32)
Calcium: 9.4 mg/dL (ref 8.9–10.3)
Chloride: 106 mmol/L (ref 98–111)
Creatinine, Ser: 0.74 mg/dL (ref 0.44–1.00)
GFR, Estimated: >60 mL/min (ref >60)
Glucose, Bld: 352 mg/dL — ABNORMAL HIGH (ref 70–99)
Potassium: 4.6 mmol/L (ref 3.5–5.1)
Sodium: 138 mmol/L (ref 135–145)

## 2022-01-27 LAB — BRAIN NATRIURETIC PEPTIDE: B Natriuretic Peptide: 19.8 pg/mL (ref 0.0–100.0)

## 2022-01-27 MED ORDER — ALUM & MAG HYDROXIDE-SIMETH 200-200-20 MG/5ML PO SUSP
30.0000 mL | Freq: Once | ORAL | Status: AC
  Administered 2022-01-27: 30 mL via ORAL
  Filled 2022-01-27: qty 30

## 2022-01-27 MED ORDER — ACETAMINOPHEN 325 MG PO TABS
650.0000 mg | ORAL_TABLET | Freq: Once | ORAL | Status: AC
  Administered 2022-01-27: 650 mg via ORAL
  Filled 2022-01-27: qty 2

## 2022-01-27 MED ORDER — LIDOCAINE VISCOUS HCL 2 % MT SOLN
15.0000 mL | Freq: Once | OROMUCOSAL | Status: AC
  Administered 2022-01-27: 15 mL via ORAL
  Filled 2022-01-27: qty 15

## 2022-01-27 NOTE — ED Notes (Signed)
Pt found wander the halls. Pt stated she wanted to go to the lobby and if anyone needed to talk to her about her results, she would be out there but she "just wanted to know if her toes were going to be cut off"

## 2022-01-27 NOTE — ED Provider Notes (Signed)
[facility] DEPT Provider Note   CSN: [id number] Arrival date & time: 01/26/22  1953     History  Chief Complaint  Patient presents with   Edema    Katherine Franklin is a 56 y.o. female, history of diabetes, acid reflux, who presents to the ED secondary to bilateral lower extremity swelling for the last 4 months.  She states that they have been more swollen for the last couple months, and she wanted them to be looked at.  She also states that she does not have a podiatrist and would like a podiatrist.  Notes that she has not been able to cut her nails for the last 4 months.  Requesting acid reflux medication as she ran out as well.  HPI     Home Medications Prior to Admission medications   Medication Sig Start Date End Date Taking? Authorizing Provider  ARIPiprazole (ABILIFY) 10 MG tablet Take 1 tablet (10 mg total) by mouth daily. 06/05/19   Starkes-Perry, Gayland Curry, FNP  ARIPiprazole (ABILIFY) 10 MG tablet Take 10 mg by mouth daily. 06/05/19   [provider]  benztropine (COGENTIN) 1 MG tablet Take 1 tablet (1 mg total) by mouth daily. 06/05/19 07/05/19  Suella Broad, FNP  benztropine (COGENTIN) 1 MG tablet Take 1 mg by mouth daily. 06/05/19   [provider]  clotrimazole (LOTRIMIN) 1 % cream Apply 1 application topically 2 (two) times daily. 14 day course started 05/31/2019 05/31/19   [provider]  gabapentin (NEURONTIN) 100 MG capsule Take 100 mg by mouth at bedtime. 05/31/19   [provider]  gabapentin (NEURONTIN) 100 MG capsule Take 100 mg by mouth at bedtime. 05/31/19   [provider]  lisinopril (ZESTRIL) 20 MG tablet Take 1 tablet (20 mg total) by mouth daily. 04/28/19   Sharma Covert, MD  lisinopril (ZESTRIL) 20 MG tablet Take 30 mg by mouth daily. 05/31/19   [provider]  metFORMIN (GLUCOPHAGE-XR) 500 MG 24 hr tablet Take 2,000 mg by mouth every morning. 05/31/19   [provider]  metFORMIN (GLUCOPHAGE-XR) 500 MG 24 hr tablet Take 2,000 mg by mouth every morning. 05/31/19   [provider]  pantoprazole (PROTONIX) 40 MG tablet Take 40 mg by mouth daily. 05/31/19   [provider]  pantoprazole (PROTONIX) 40 MG tablet Take 40 mg by mouth daily. 05/31/19   [provider]  sertraline (ZOLOFT) 25 MG tablet Take 1 tablet (25 mg total) by mouth daily. 06/05/19   Starkes-Perry, Gayland Curry, FNP  sertraline (ZOLOFT) 25 MG tablet Take 25 mg by mouth daily. 06/05/19   [provider]  sertraline (ZOLOFT) 50 MG tablet Take 50 mg by mouth daily. 05/31/19   [provider]      Allergies    Sulfa antibiotics, Sulfa antibiotics, Latex, and Latex    Review of Systems   Review of Systems  Cardiovascular:  Positive for leg swelling.    Physical Exam Updated Vital Signs BP (!) 158/96 (BP Location: Left Arm)   Pulse 89   Temp 98.3 F (36.8 C)   Resp 18   Ht 5\' 9"  (1.753 m)   Wt 104 kg   SpO2 99%   BMI 33.86 kg/m  Physical Exam Vitals and nursing note reviewed.  Constitutional:      Appearance: Normal appearance.  HENT:     Head: Normocephalic and atraumatic.     Nose: Nose normal.     Mouth/Throat:  Mouth: Mucous membranes are moist.  Eyes:     Extraocular Movements: Extraocular movements intact.     Conjunctiva/sclera: Conjunctivae normal.     Pupils: Pupils are equal, round, and reactive to light.  Cardiovascular:     Rate and Rhythm: Normal rate and regular rhythm.     Pulses: Normal pulses.  Pulmonary:     Effort: Pulmonary effort is normal.     Breath sounds: Normal breath sounds.  Abdominal:     General: Abdomen is flat. Bowel sounds are normal.     Palpations: Abdomen is soft.  Musculoskeletal:        General: Normal range of motion.     Cervical back: Normal range of motion and neck supple.     Right lower leg: 1+ Pitting Edema present.     Left lower leg: 1+ Pitting Edema present.  Skin:     General: Skin is warm and dry.     Capillary Refill: Capillary refill takes less than 2 seconds. Capillary refill of toes <2sec, warm to touch Neurological:     General: No focal deficit present.     Mental Status: She is alert.  Psychiatric:        Mood and Affect: Mood normal.        Thought Content: Thought content normal.     ED Results / Procedures / Treatments   Labs (all labs ordered are listed, but only abnormal results are displayed) Labs Reviewed  BASIC METABOLIC PANEL - Abnormal; Notable for the following components:      Result Value   Glucose, Bld 352 (*)    BUN 23 (*)    All other components within normal limits  BRAIN NATRIURETIC PEPTIDE    EKG None  Radiology No results found.  Procedures Procedures    Medications Ordered in ED Medications  acetaminophen (TYLENOL) tablet 650 mg (650 mg Oral Given 01/27/22 0254)  alum & mag hydroxide-simeth (MAALOX/MYLANTA) 200-200-20 MG/5ML suspension 30 mL (30 mLs Oral Given 01/27/22 0254)    And  lidocaine (XYLOCAINE) 2 % viscous mouth solution 15 mL (15 mLs Oral Given 01/27/22 0254)    ED Course/ Medical Decision Making/ A&P                           Medical Decision Making Amount and/or Complexity of Data Reviewed Labs: ordered.  Risk OTC drugs. Prescription drug management.  Patient is a 56 year old female, history of diabetes here for bilateral peripheral edema, for the last 4 months.  I ordered a BNP and a BMP for further eval.  She eloped prior to her results.  Final Clinical Impression(s) / ED Diagnoses Final diagnoses:  Edema, unspecified type    Rx / DC Orders ED Discharge Orders     None         Ho Parisi, Harley Alto, PA 01/27/22 0344    Maia Plan, MD 02/01/22 1623

## 2022-01-28 ENCOUNTER — Emergency Department (HOSPITAL_COMMUNITY)
Admission: EM | Admit: 2022-01-28 | Discharge: 2022-01-28 | Discharge status: Against Medical Advice | Payer: Medicaid Other | Attending: Emergency Medicine | Admitting: Emergency Medicine

## 2022-01-28 ENCOUNTER — Encounter (HOSPITAL_COMMUNITY): Payer: Self-pay

## 2022-01-28 DIAGNOSIS — Z5321 Procedure and treatment not carried out due to patient leaving prior to being seen by health care provider: Secondary | ICD-10-CM | POA: Diagnosis not present

## 2022-01-28 DIAGNOSIS — R600 Localized edema: Secondary | ICD-10-CM | POA: Insufficient documentation

## 2022-01-28 NOTE — ED Triage Notes (Signed)
Pt with multiple complaints during triage. Pt would like her wounds to her lips and feet checked, states she has T2DM. Pt also asking for multiple medication refills during triage.

## 2022-01-28 NOTE — ED Provider Triage Note (Signed)
Emergency Medicine Provider Triage Evaluation Note  Katherine Franklin , a 56 y.o. female  was evaluated in triage.  Pt complains of multiple complaints including lip injury, peripheral edema, medication refills.  Was seen yesterday but eloped.  States she has history of drug abuse and could not stay to finish the visit.  Review of Systems  Positive: As above Negative: As above  Physical Exam  BP (!) 131/93 (BP Location: Left Arm)   Pulse 95   Temp 98.5 F (36.9 C) (Oral)   Resp 18   SpO2 98%  Gen:   Awake, no distress   Resp:  Normal effort  MSK:   Moves extremities without difficulty  Other:    Medical Decision Making  Medically screening exam initiated at 1:40 PM.  Appropriate orders placed.  Katherine Franklin was informed that the remainder of the evaluation will be completed by another provider, this initial triage assessment does not replace that evaluation, and the importance of remaining in the ED until their evaluation is complete.    Evlyn Courier, PA-C 01/28/22 1341

## 2022-01-29 ENCOUNTER — Emergency Department (HOSPITAL_COMMUNITY): Payer: Medicaid Other

## 2022-01-29 ENCOUNTER — Emergency Department (HOSPITAL_COMMUNITY)
Admission: EM | Admit: 2022-01-29 | Discharge: 2022-01-29 | Disposition: A | Discharge status: Home / Self Care | Payer: Medicaid Other | Attending: Emergency Medicine | Admitting: Emergency Medicine

## 2022-01-29 ENCOUNTER — Encounter (HOSPITAL_COMMUNITY): Payer: Self-pay

## 2022-01-29 DIAGNOSIS — F17210 Nicotine dependence, cigarettes, uncomplicated: Secondary | ICD-10-CM | POA: Diagnosis not present

## 2022-01-29 DIAGNOSIS — R739 Hyperglycemia, unspecified: Secondary | ICD-10-CM

## 2022-01-29 DIAGNOSIS — Z91040 Latex allergy status: Secondary | ICD-10-CM | POA: Diagnosis not present

## 2022-01-29 DIAGNOSIS — I96 Gangrene, not elsewhere classified: Secondary | ICD-10-CM

## 2022-01-29 DIAGNOSIS — Z7984 Long term (current) use of oral hypoglycemic drugs: Secondary | ICD-10-CM | POA: Diagnosis not present

## 2022-01-29 DIAGNOSIS — Z79899 Other long term (current) drug therapy: Secondary | ICD-10-CM | POA: Diagnosis not present

## 2022-01-29 DIAGNOSIS — Z5900 Homelessness unspecified: Secondary | ICD-10-CM | POA: Insufficient documentation

## 2022-01-29 DIAGNOSIS — M79672 Pain in left foot: Secondary | ICD-10-CM | POA: Diagnosis not present

## 2022-01-29 DIAGNOSIS — H538 Other visual disturbances: Secondary | ICD-10-CM | POA: Diagnosis not present

## 2022-01-29 DIAGNOSIS — R112 Nausea with vomiting, unspecified: Secondary | ICD-10-CM | POA: Insufficient documentation

## 2022-01-29 DIAGNOSIS — I998 Other disorder of circulatory system: Secondary | ICD-10-CM | POA: Insufficient documentation

## 2022-01-29 DIAGNOSIS — M79671 Pain in right foot: Secondary | ICD-10-CM | POA: Diagnosis present

## 2022-01-29 DIAGNOSIS — I10 Essential (primary) hypertension: Secondary | ICD-10-CM | POA: Diagnosis not present

## 2022-01-29 DIAGNOSIS — E1165 Type 2 diabetes mellitus with hyperglycemia: Secondary | ICD-10-CM | POA: Insufficient documentation

## 2022-01-29 LAB — BASIC METABOLIC PANEL
Anion gap: 9 (ref 5–15)
BUN: 23 mg/dL — ABNORMAL HIGH (ref 6–20)
CO2: 27 mmol/L (ref 22–32)
Calcium: 9.6 mg/dL (ref 8.9–10.3)
Chloride: 101 mmol/L (ref 98–111)
Creatinine, Ser: 0.79 mg/dL (ref 0.44–1.00)
GFR, Estimated: >60 mL/min (ref >60)
Glucose, Bld: 350 mg/dL — ABNORMAL HIGH (ref 70–99)
Potassium: 4 mmol/L (ref 3.5–5.1)
Sodium: 137 mmol/L (ref 135–145)

## 2022-01-29 LAB — CBC WITH DIFFERENTIAL/PLATELET
Abs Immature Granulocytes: 0.01 10*3/uL (ref 0.00–0.07)
Basophils Absolute: 0 10*3/uL (ref 0.0–0.1)
Basophils Relative: 1 %
Eosinophils Absolute: 0.1 10*3/uL (ref 0.0–0.5)
Eosinophils Relative: 1 %
HCT: 38.6 % (ref 36.0–46.0)
Hemoglobin: 12.2 g/dL (ref 12.0–15.0)
Immature Granulocytes: 0 %
Lymphocytes Relative: 31 %
Lymphs Abs: 1.6 10*3/uL (ref 0.7–4.0)
MCH: 27.5 pg (ref 26.0–34.0)
MCHC: 31.6 g/dL (ref 30.0–36.0)
MCV: 87.1 fL (ref 80.0–100.0)
Monocytes Absolute: 0.3 10*3/uL (ref 0.1–1.0)
Monocytes Relative: 5 %
Neutro Abs: 3.1 10*3/uL (ref 1.7–7.7)
Neutrophils Relative %: 62 %
Platelets: 349 10*3/uL (ref 150–400)
RBC: 4.43 MIL/uL (ref 3.87–5.11)
RDW: 13.3 % (ref 11.5–15.5)
WBC: 5 10*3/uL (ref 4.0–10.5)
nRBC: 0 % (ref 0.0–0.2)

## 2022-01-29 LAB — CBG MONITORING, ED: Glucose-Capillary: 356 mg/dL — ABNORMAL HIGH (ref 70–99)

## 2022-01-29 MED ORDER — SODIUM CHLORIDE 0.9 % IV BOLUS
1000.0000 mL | Freq: Once | INTRAVENOUS | Status: AC
  Administered 2022-01-29: 1000 mL via INTRAVENOUS

## 2022-01-29 MED ORDER — PANTOPRAZOLE SODIUM 40 MG IV SOLR
40.0000 mg | Freq: Once | INTRAVENOUS | Status: AC
  Administered 2022-01-29: 40 mg via INTRAVENOUS
  Filled 2022-01-29: qty 10

## 2022-01-29 MED ORDER — ONDANSETRON HCL 4 MG/2ML IJ SOLN
4.0000 mg | Freq: Once | INTRAMUSCULAR | Status: AC
  Administered 2022-01-29: 4 mg via INTRAVENOUS
  Filled 2022-01-29: qty 2

## 2022-01-29 MED ORDER — PANTOPRAZOLE SODIUM 40 MG PO TBEC: 40.0000 mg | DELAYED_RELEASE_TABLET | Freq: Every day | ORAL | 0 refills | Status: DC

## 2022-01-29 MED ORDER — METFORMIN HCL ER 500 MG PO TB24: 2000.0000 mg | ORAL_TABLET | Freq: Every morning | ORAL | 0 refills | Status: DC

## 2022-01-29 MED ORDER — LISINOPRIL 20 MG PO TABS: 20.0000 mg | ORAL_TABLET | Freq: Every day | ORAL | 0 refills | Status: DC

## 2022-01-29 MED ORDER — METFORMIN HCL 500 MG PO TABS
500.0000 mg | ORAL_TABLET | Freq: Once | ORAL | Status: AC
  Administered 2022-01-29: 500 mg via ORAL
  Filled 2022-01-29: qty 1

## 2022-01-29 MED ORDER — ONDANSETRON HCL 8 MG PO TABS: 8.0000 mg | ORAL_TABLET | Freq: Three times a day (TID) | ORAL | 0 refills | Status: DC | PRN

## 2022-01-29 MED ORDER — INSULIN ASPART 100 UNIT/ML IJ SOLN
10.0000 [IU] | Freq: Once | INTRAMUSCULAR | Status: AC
  Administered 2022-01-29: 10 [IU] via SUBCUTANEOUS
  Filled 2022-01-29: qty 0.1

## 2022-01-29 NOTE — ED Notes (Signed)
Patient left without telling her RN or NT so they were unable to obtain discharge vitals. She did tell triage staff that she had an [place] meeting to attend. Patient was up for discharge but was waiting for resources from SW.

## 2022-01-29 NOTE — ED Triage Notes (Signed)
Pt comes in complaining of foot pain . Pt also says that she has scabies.

## 2022-01-29 NOTE — ED Notes (Addendum)
Pt given basin and wash rags per her request to clean up. Pt hoping to get a referral to see a podiatrist. Pt states she is trying to turn her life around because she does not want to loose her feet.

## 2022-01-29 NOTE — ED Provider Notes (Addendum)
[facility] DEPT Provider Note: Georgena Spurling, MD, FACEP  CSN: [id number] MRN: [id number] ARRIVAL: 01/29/22 at [place]: [facility]   HISTORY OF PRESENT ILLNESS  01/29/22 4:00 AM Katherine Franklin is a 56 y.o. female homeless person with type 2 diabetes and acid reflux.  She has had dark discoloration and pain in her toes.  Pain is moderate but she is able to ambulate.  She has been told in the past that she may require amputation of her toes because she has uncontrolled diabetes (she admits to being noncompliant with her medications) but states symptoms have improved.  She would like a referral to a podiatrist.  She also states her sugar was 400 earlier and would like [place] to treat that.  This has been accompanied by blurred vision which occurs when her sugar is elevated.  She is also having exacerbation of her GERD and has had some nausea and vomiting.  She would like to be checked for scabies because she has had some generalized itching.   Past Medical History:  Diagnosis Date   Diabetes mellitus without complication ([place])    Drug abuse ([place]) 09/22/2018   GERD (gastroesophageal reflux disease)    Hypertension    PTSD (post-traumatic stress disorder)    Schizo affective schizophrenia ([place])     Past Surgical History:  Procedure Laterality Date   SKIN GRAFT     TUBAL LIGATION      Family History  Family history unknown: Yes    Social History   Tobacco Use   Smoking status: Every Day    Packs/day: 2.00    Types: Cigarettes   Smokeless tobacco: Never  Vaping Use   Vaping Use: Never used  Substance Use Topics   Alcohol use: Not Currently   Drug use: Yes    Frequency: 7.0 times per week    Types: "Crack" cocaine, Cocaine    Comment: undetermined amount    Prior to Admission medications   Medication Sig Start Date End Date Taking? Authorizing Provider  ondansetron (ZOFRAN) 8 MG tablet Take 1 tablet (8 mg total) by mouth every 8 (eight)  hours as needed for nausea or vomiting. 01/29/22  Yes Ai Sonnenfeld, MD  lisinopril (ZESTRIL) 20 MG tablet Take 1 tablet (20 mg total) by mouth daily. 01/29/22   Deretha Ertle, MD  metFORMIN (GLUCOPHAGE-XR) 500 MG 24 hr tablet Take 4 tablets (2,000 mg total) by mouth every morning. 01/29/22 02/28/22  Jorja Empie, MD  pantoprazole (PROTONIX) 40 MG tablet Take 1 tablet (40 mg total) by mouth daily. 01/29/22   Ilia Engelbert, MD    Allergies Sulfa antibiotics, Sulfa antibiotics, Latex, and Latex   REVIEW OF SYSTEMS  Negative except as noted here or in the History of Present Illness.   PHYSICAL EXAMINATION  Initial Vital Signs Blood pressure (!) 147/109, pulse 95, temperature 97.7 F (36.5 C), temperature source Oral, resp. rate 16, SpO2 98 %.  Examination General: Well-developed, well-nourished female in no acute distress; appearance consistent with age of record HENT: normocephalic; atraumatic Eyes: pupils equal, round and reactive to light; extraocular muscles intact Neck: supple Heart: regular rate and rhythm Lungs: clear to auscultation bilaterally Abdomen: soft; nondistended; nontender;bowel sounds present Extremities: No deformity; necrotic changes of tips of toes with absence of nail of right great toe:      Neurologic: Awake, alert and oriented; motor function intact in all extremities and symmetric; no facial droop Skin: Warm and dry; no rash seen  Psychiatric: Normal mood and affect   RESULTS  Summary of this visit's results, reviewed and interpreted by myself:   EKG Interpretation  Date/Time:    Ventricular Rate:    PR Interval:    QRS Duration:   QT Interval:    QTC Calculation:   R Axis:     Text Interpretation:         Laboratory Studies: Results for orders placed or performed during the hospital encounter of 01/29/22 (from the past 24 hour(s))  CBG monitoring, ED     Status: Abnormal   Collection Time: 01/29/22  3:52 AM  Result Value Ref Range    Glucose-Capillary 356 (H) 70 - 99 mg/dL  CBC with Differential     Status: None   Collection Time: 01/29/22  4:32 AM  Result Value Ref Range   WBC 5.0 4.0 - 10.5 K/uL   RBC 4.43 3.87 - 5.11 MIL/uL   Hemoglobin 12.2 12.0 - 15.0 g/dL   HCT 38.6 36.0 - 46.0 %   MCV 87.1 80.0 - 100.0 fL   MCH 27.5 26.0 - 34.0 pg   MCHC 31.6 30.0 - 36.0 g/dL   RDW 13.3 11.5 - 15.5 %   Platelets 349 150 - 400 K/uL   nRBC 0.0 0.0 - 0.2 %   Neutrophils Relative % 62 %   Neutro Abs 3.1 1.7 - 7.7 K/uL   Lymphocytes Relative 31 %   Lymphs Abs 1.6 0.7 - 4.0 K/uL   Monocytes Relative 5 %   Monocytes Absolute 0.3 0.1 - 1.0 K/uL   Eosinophils Relative 1 %   Eosinophils Absolute 0.1 0.0 - 0.5 K/uL   Basophils Relative 1 %   Basophils Absolute 0.0 0.0 - 0.1 K/uL   Immature Granulocytes 0 %   Abs Immature Granulocytes 0.01 0.00 - 0.07 K/uL  Basic metabolic panel     Status: Abnormal   Collection Time: 01/29/22  4:32 AM  Result Value Ref Range   Sodium 137 135 - 145 mmol/L   Potassium 4.0 3.5 - 5.1 mmol/L   Chloride 101 98 - 111 mmol/L   CO2 27 22 - 32 mmol/L   Glucose, Bld 350 (H) 70 - 99 mg/dL   BUN 23 (H) 6 - 20 mg/dL   Creatinine, Ser 0.79 0.44 - 1.00 mg/dL   Calcium 9.6 8.9 - 10.3 mg/dL   GFR, Estimated >60 >60 mL/min   Anion gap 9 5 - 15   Imaging Studies: DG Foot 2 Views Right  Result Date: 01/29/2022 CLINICAL DATA:  Foot pain, concern for osteomyelitis. EXAM: RIGHT FOOT - 2 VIEW; LEFT FOOT - 2 VIEW COMPARISON:  04/18/2019. FINDINGS: Left foot: No acute fracture or dislocation. No bony erosion or periosteal elevation. There is hallux valgus deformity with mild degenerative changes at the first metatarsophalangeal joint. Degenerative changes are present in the midfoot. Vascular calcifications are present in the soft tissues. Right foot: There is bony deformity of the proximal phalanx of the first digit which may be chronic. The remaining bony structures are intact. Degenerative changes are present at  the first metatarsophalangeal joint and midfoot. A 6 mm linear density is present between the fourth and fifth metatarsals, unchanged from the prior exam. Vascular calcifications in the soft tissues. Mild soft tissue swelling is present over the dorsum of the foot and at the ankle. IMPRESSION: 1. No radiographic evidence of osteomyelitis. 2. Bony deformity of the of the proximal phalanx of the first digit on the right, likely related to  old trauma. 3. Stable linear radiopaque density between the fourth and fifth metatarsals on the right, suggesting radiopaque foreign body and unchanged from 2020. Electronically Signed   By: Brett Fairy M.D.   On: 01/29/2022 05:02   DG Foot 2 Views Left  Result Date: 01/29/2022 CLINICAL DATA:  Foot pain, concern for osteomyelitis. EXAM: RIGHT FOOT - 2 VIEW; LEFT FOOT - 2 VIEW COMPARISON:  04/18/2019. FINDINGS: Left foot: No acute fracture or dislocation. No bony erosion or periosteal elevation. There is hallux valgus deformity with mild degenerative changes at the first metatarsophalangeal joint. Degenerative changes are present in the midfoot. Vascular calcifications are present in the soft tissues. Right foot: There is bony deformity of the proximal phalanx of the first digit which may be chronic. The remaining bony structures are intact. Degenerative changes are present at the first metatarsophalangeal joint and midfoot. A 6 mm linear density is present between the fourth and fifth metatarsals, unchanged from the prior exam. Vascular calcifications in the soft tissues. Mild soft tissue swelling is present over the dorsum of the foot and at the ankle. IMPRESSION: 1. No radiographic evidence of osteomyelitis. 2. Bony deformity of the of the proximal phalanx of the first digit on the right, likely related to old trauma. 3. Stable linear radiopaque density between the fourth and fifth metatarsals on the right, suggesting radiopaque foreign body and unchanged from 2020.  Electronically Signed   By: Brett Fairy M.D.   On: 01/29/2022 05:02    ED COURSE and MDM  Nursing notes, initial and subsequent vitals signs, including pulse oximetry, reviewed and interpreted by myself.  Vitals:   01/29/22 0348 01/29/22 0357 01/29/22 0443  BP:  (!) 147/109   Pulse:  95   Resp:  16   Temp:  97.7 F (36.5 C) (!) 97.5 F (36.4 C)  TempSrc:  Oral   SpO2: 99% 98%    Medications  metFORMIN (GLUCOPHAGE) tablet 500 mg (has no administration in time range)  sodium chloride 0.9 % bolus 1,000 mL (1,000 mLs Intravenous New Bag/Given 01/29/22 0413)  insulin aspart (novoLOG) injection 10 Units (10 Units Subcutaneous Given 01/29/22 0441)  pantoprazole (PROTONIX) injection 40 mg (40 mg Intravenous Given 01/29/22 0417)  ondansetron (ZOFRAN) injection 4 mg (4 mg Intravenous Given 01/29/22 0418)   5:10 AM No evidence of osteomyelitis on radiographs.  She does not appear to be acutely infected and I believe she has changes consistent with dry gangrene which do not need acute intervention.  Will refer to podiatry.  We will refill her metformin and GERD medications.  We will have social work meet with her later this morning regarding homeless issues as she would like to find a place to live and improve her life.  No evidence of scabies on physical exam.  The patient has a history of eloping from the ED even when attempting to have her admitted.   PROCEDURES  Procedures   ED DIAGNOSES     ICD-10-CM   1. Hyperglycemia  R73.9     2. Homelessness  Z59.00     3. Gangrene of both feet ([place])  I96          Shanon Rosser, MD 01/29/22 0517    Shanon Rosser, MD 01/29/22 [phone]

## 2022-01-30 ENCOUNTER — Encounter (HOSPITAL_COMMUNITY): Payer: Self-pay | Admitting: Emergency Medicine

## 2022-01-30 ENCOUNTER — Emergency Department (HOSPITAL_COMMUNITY)
Admission: EM | Admit: 2022-01-30 | Discharge: 2022-01-30 | Disposition: A | Discharge status: Home / Self Care | Payer: Medicaid Other | Attending: Emergency Medicine | Admitting: Emergency Medicine

## 2022-01-30 DIAGNOSIS — Z7984 Long term (current) use of oral hypoglycemic drugs: Secondary | ICD-10-CM | POA: Insufficient documentation

## 2022-01-30 DIAGNOSIS — Z046 Encounter for general psychiatric examination, requested by authority: Secondary | ICD-10-CM | POA: Insufficient documentation

## 2022-01-30 DIAGNOSIS — I10 Essential (primary) hypertension: Secondary | ICD-10-CM | POA: Diagnosis not present

## 2022-01-30 DIAGNOSIS — Z5329 Procedure and treatment not carried out because of patient's decision for other reasons: Secondary | ICD-10-CM | POA: Diagnosis not present

## 2022-01-30 DIAGNOSIS — R45850 Homicidal ideations: Secondary | ICD-10-CM | POA: Diagnosis not present

## 2022-01-30 DIAGNOSIS — E119 Type 2 diabetes mellitus without complications: Secondary | ICD-10-CM | POA: Diagnosis not present

## 2022-01-30 DIAGNOSIS — Z79899 Other long term (current) drug therapy: Secondary | ICD-10-CM | POA: Insufficient documentation

## 2022-01-30 DIAGNOSIS — Z5900 Homelessness unspecified: Secondary | ICD-10-CM | POA: Diagnosis not present

## 2022-01-30 DIAGNOSIS — Z91040 Latex allergy status: Secondary | ICD-10-CM | POA: Insufficient documentation

## 2022-01-30 DIAGNOSIS — Z20822 Contact with and (suspected) exposure to covid-19: Secondary | ICD-10-CM | POA: Diagnosis not present

## 2022-01-30 DIAGNOSIS — Z008 Encounter for other general examination: Secondary | ICD-10-CM

## 2022-01-30 DIAGNOSIS — Z5321 Procedure and treatment not carried out due to patient leaving prior to being seen by health care provider: Secondary | ICD-10-CM

## 2022-01-30 LAB — URINALYSIS, ROUTINE W REFLEX MICROSCOPIC
Bacteria, UA: NONE SEEN
Bilirubin Urine: NEGATIVE
Glucose, UA: >=500 mg/dL — AB
Hgb urine dipstick: NEGATIVE
Ketones, ur: 5 mg/dL — AB
Leukocytes,Ua: NEGATIVE
Nitrite: NEGATIVE
Protein, ur: NEGATIVE mg/dL
Specific Gravity, Urine: 1.026 (ref 1.005–1.030)
pH: 5 (ref 5.0–8.0)

## 2022-01-30 LAB — RESP PANEL BY RT-PCR (FLU A&B, COVID) ARPGX2
Influenza A by PCR: NEGATIVE
Influenza B by PCR: NEGATIVE
SARS Coronavirus 2 by RT PCR: NEGATIVE

## 2022-01-30 LAB — CBC
HCT: 34.7 % — ABNORMAL LOW (ref 36.0–46.0)
Hemoglobin: 11.1 g/dL — ABNORMAL LOW (ref 12.0–15.0)
MCH: 27.8 pg (ref 26.0–34.0)
MCHC: 32 g/dL (ref 30.0–36.0)
MCV: 86.8 fL (ref 80.0–100.0)
Platelets: 300 10*3/uL (ref 150–400)
RBC: 4 MIL/uL (ref 3.87–5.11)
RDW: 13.4 % (ref 11.5–15.5)
WBC: 4.9 10*3/uL (ref 4.0–10.5)
nRBC: 0 % (ref 0.0–0.2)

## 2022-01-30 LAB — BASIC METABOLIC PANEL
Anion gap: 8 (ref 5–15)
BUN: 23 mg/dL — ABNORMAL HIGH (ref 6–20)
CO2: 24 mmol/L (ref 22–32)
Calcium: 9.2 mg/dL (ref 8.9–10.3)
Chloride: 105 mmol/L (ref 98–111)
Creatinine, Ser: 0.71 mg/dL (ref 0.44–1.00)
GFR, Estimated: >60 mL/min (ref >60)
Glucose, Bld: 356 mg/dL — ABNORMAL HIGH (ref 70–99)
Potassium: 4 mmol/L (ref 3.5–5.1)
Sodium: 137 mmol/L (ref 135–145)

## 2022-01-30 LAB — RAPID URINE DRUG SCREEN, HOSP PERFORMED
Amphetamines: NOT DETECTED
Barbiturates: NOT DETECTED
Benzodiazepines: NOT DETECTED
Cocaine: POSITIVE — AB
Opiates: NOT DETECTED
Tetrahydrocannabinol: NOT DETECTED

## 2022-01-30 LAB — I-STAT BETA HCG BLOOD, ED (MC, WL, AP ONLY): I-stat hCG, quantitative: <5 m[IU]/mL (ref <5)

## 2022-01-30 LAB — ACETAMINOPHEN LEVEL: Acetaminophen (Tylenol), Serum: <10 ug/mL — ABNORMAL LOW (ref 10–30)

## 2022-01-30 LAB — CBG MONITORING, ED: Glucose-Capillary: 320 mg/dL — ABNORMAL HIGH (ref 70–99)

## 2022-01-30 LAB — SALICYLATE LEVEL: Salicylate Lvl: <7 mg/dL — ABNORMAL LOW (ref 7.0–30.0)

## 2022-01-30 LAB — ETHANOL: Alcohol, Ethyl (B): <10 mg/dL (ref <10)

## 2022-01-30 NOTE — ED Notes (Signed)
Pt admits to using crack tonight. Pt calm and cooperative with nursing care.
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
## 2022-01-30 NOTE — Consult Note (Signed)
[facility] ED ASSESSMENT   Patient discharged prior to completing ED assessment. If patient returns and interested in psychiatric services please re-consult TTS and or provide information to the [facility] for detox and substance abuse.    Suella Broad, FNP 01/30/2022 10:09 AM

## 2022-01-30 NOTE — ED Notes (Signed)
Pt left after talking to PA.  She would not listen and insisted on getting her suit case and leave.

## 2022-01-30 NOTE — Inpatient Diabetes Management (Signed)
Inpatient Diabetes Program Recommendations  AACE/ADA: New Consensus Statement on Inpatient Glycemic Control (2015)  Target Ranges:  Prepandial:   less than 140 mg/dL      Peak postprandial:   less than 180 mg/dL (1-2 hours)      Critically ill patients:  140 - 180 mg/dL   Lab Results  Component Value Date   GLUCAP 320 (H) 01/30/2022   HGBA1C 14.0 (H) 06/27/2019    Review of Glycemic Control  Latest Reference Range & Units 01/29/22 03:52 01/30/22 08:24  Glucose-Capillary 70 - 99 mg/dL 356 (H) 320 (H)  (H): Data is abnormally high  Diabetes history:  DM2  Outpatient Diabetes medications:  Metformin 2000 mg QD-Not taking  Current orders for Inpatient glycemic control:  None  Inpatient Diabetes Program Recommendations:    Please consider:  Semglee 22 units QD (0.2 units x 108.9 kg) Carb modified diet Glycemic control order set using Novolog 0-15 units TID and 0-5 units QHS Obtain a current A1C  Will continue to follow while inpatient.  Thank you, Reche Dixon, MSN, [facility] Coordinator Inpatient Diabetes Program [phone] (team pager from 8a-5p)

## 2022-01-30 NOTE — ED Notes (Signed)
Patient states she needs to be d/c as soon as possible to get to [place] to get her medications. PA aware.

## 2022-01-31 ENCOUNTER — Other Ambulatory Visit: Payer: Self-pay

## 2022-01-31 ENCOUNTER — Emergency Department (HOSPITAL_COMMUNITY)
Admission: EM | Admit: 2022-01-31 | Discharge: 2022-01-31 | Disposition: A | Discharge status: Home / Self Care | Payer: Medicaid Other | Attending: Emergency Medicine | Admitting: Emergency Medicine

## 2022-01-31 ENCOUNTER — Emergency Department (HOSPITAL_COMMUNITY)
Admission: EM | Admit: 2022-01-31 | Discharge: 2022-01-31 | Discharge status: Against Medical Advice | Payer: Medicaid Other | Source: Home / Self Care | Attending: Emergency Medicine | Admitting: Emergency Medicine

## 2022-01-31 DIAGNOSIS — Z5321 Procedure and treatment not carried out due to patient leaving prior to being seen by health care provider: Secondary | ICD-10-CM | POA: Diagnosis not present

## 2022-01-31 DIAGNOSIS — Z046 Encounter for general psychiatric examination, requested by authority: Secondary | ICD-10-CM | POA: Insufficient documentation

## 2022-01-31 DIAGNOSIS — R739 Hyperglycemia, unspecified: Secondary | ICD-10-CM | POA: Diagnosis present

## 2022-01-31 DIAGNOSIS — R4689 Other symptoms and signs involving appearance and behavior: Secondary | ICD-10-CM
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
## 2022-01-31 NOTE — ED Notes (Signed)
BIB [employer] rambling, pt keeps asking police "if I take my clothes off will you arrest me, arrest her please" patient then takes clothes off, bends over, and shakes it and keeps saying arrest me arrest me. [employer] placed cuffs and escorted off

## 2022-01-31 NOTE — ED Notes (Addendum)
Pt stripped her clothes off in hallway screaming she was leaving due to being told to wait to use the phone
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
## 2022-01-31 NOTE — ED Provider Notes (Signed)
I was called by the nurse because she was naked in the hallway and she was just brought to the ER earlier today and eloped.  Somehow she got brought back to the ER but she is telling the police to just arrest her.  Patient was seen running down the hallway naked.  Police arrested her in the ambulance bay.  I did not establish contact with her.   Drenda Freeze, MD 01/31/22 2111
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
## 2022-02-06 ENCOUNTER — Emergency Department (HOSPITAL_COMMUNITY)
Admission: EM | Admit: 2022-02-06 | Discharge: 2022-02-06 | Discharge status: Against Medical Advice | Payer: Medicaid Other | Attending: Emergency Medicine | Admitting: Emergency Medicine

## 2022-02-06 ENCOUNTER — Encounter (HOSPITAL_COMMUNITY): Payer: Self-pay

## 2022-02-06 DIAGNOSIS — Z5321 Procedure and treatment not carried out due to patient leaving prior to being seen by health care provider: Secondary | ICD-10-CM | POA: Diagnosis present

## 2022-02-06 NOTE — ED Notes (Addendum)
Note done per report, triage unable to be finished. Pt eloped before finished. Previous documented in error under wrong pt.

## 2022-02-06 NOTE — ED Triage Notes (Signed)
Pt arrived via [facility], c/o "toes turning black" ambulatory in triage.

## 2022-02-07 ENCOUNTER — Other Ambulatory Visit: Payer: Self-pay

## 2022-02-07 ENCOUNTER — Emergency Department (HOSPITAL_COMMUNITY)
Admission: EM | Admit: 2022-02-07 | Discharge: 2022-02-08 | Disposition: A | Discharge status: Home / Self Care | Payer: Medicaid Other | Source: Home / Self Care

## 2022-02-07 ENCOUNTER — Emergency Department (HOSPITAL_COMMUNITY)
Admission: EM | Admit: 2022-02-07 | Discharge: 2022-02-07 | Disposition: A | Discharge status: Home / Self Care | Payer: Medicaid Other | Attending: Emergency Medicine | Admitting: Emergency Medicine

## 2022-02-07 ENCOUNTER — Encounter (HOSPITAL_COMMUNITY): Payer: Self-pay

## 2022-02-07 DIAGNOSIS — M25572 Pain in left ankle and joints of left foot: Secondary | ICD-10-CM | POA: Diagnosis not present

## 2022-02-07 DIAGNOSIS — Z5321 Procedure and treatment not carried out due to patient leaving prior to being seen by health care provider: Secondary | ICD-10-CM | POA: Insufficient documentation

## 2022-02-07 DIAGNOSIS — M79675 Pain in left toe(s): Secondary | ICD-10-CM | POA: Diagnosis not present

## 2022-02-07 DIAGNOSIS — M25571 Pain in right ankle and joints of right foot: Secondary | ICD-10-CM | POA: Diagnosis not present

## 2022-02-07 DIAGNOSIS — M79674 Pain in right toe(s): Secondary | ICD-10-CM | POA: Diagnosis not present

## 2022-02-07 NOTE — ED Triage Notes (Signed)
Pt BIB [facility] with complaints of bilateral foot pain. Pt states that she has neuropathy.

## 2022-02-07 NOTE — ED Triage Notes (Signed)
Patient reports pain in bilateral toes. Patient states toes are black in color. Patient also states she is itching under her skin.

## 2022-02-08 NOTE — ED Notes (Addendum)
Dr. Roxanne Mins notified by secure message that pt is requesting ativan. Pt brought to hall bed from triage, requesting ativan to "help her calm down". Pt with intermittent outburst, appears to talk to self. Pt has psych hx, seen here for same, and disruptive behavior.

## 2022-02-08 NOTE — ED Notes (Signed)
Patient threw her belongings in the trash can. Nursing staff recovered items and placed them in a clear trash bag. Items included purse, papers, several articles of clothing, and a red bag.

## 2022-02-08 NOTE — ED Notes (Signed)
Patient ambulated independently out of the department. States she just wants to go lay down outside, patient encouraged to stay and see the provider, that she can lay down on the stretcher. States "fu** that" and walked out.

## 2022-02-08 NOTE — ED Notes (Signed)
Pt left treatment area, reports "I want to go lay on concert away from the police" advised pt she can lay on the bed she was just laying on and that there was no police around. Pt st "hospital scares me, I don't like what yall do to me". This RN attemped to redirect pt and advised she is safe to lay in bed for rest and comfort. Pt insisted on walking out. Pt grab her small bag and walked towards [facility], directed by Network engineer the way out.

## 2022-02-14 ENCOUNTER — Emergency Department (HOSPITAL_COMMUNITY)
Admission: EM | Admit: 2022-02-14 | Discharge: 2022-02-14 | Disposition: A | Discharge status: Home / Self Care | Payer: Medicaid Other | Attending: Emergency Medicine | Admitting: Emergency Medicine

## 2022-02-14 ENCOUNTER — Encounter (HOSPITAL_COMMUNITY): Payer: Self-pay

## 2022-02-14 DIAGNOSIS — Z5900 Homelessness unspecified: Secondary | ICD-10-CM | POA: Insufficient documentation

## 2022-02-14 DIAGNOSIS — R739 Hyperglycemia, unspecified: Secondary | ICD-10-CM | POA: Diagnosis present

## 2022-02-14 DIAGNOSIS — Z91040 Latex allergy status: Secondary | ICD-10-CM | POA: Insufficient documentation

## 2022-02-14 DIAGNOSIS — I10 Essential (primary) hypertension: Secondary | ICD-10-CM | POA: Diagnosis not present

## 2022-02-14 DIAGNOSIS — E1165 Type 2 diabetes mellitus with hyperglycemia: Secondary | ICD-10-CM | POA: Insufficient documentation

## 2022-02-14 DIAGNOSIS — Z79899 Other long term (current) drug therapy: Secondary | ICD-10-CM | POA: Diagnosis not present

## 2022-02-14 DIAGNOSIS — Z7984 Long term (current) use of oral hypoglycemic drugs: Secondary | ICD-10-CM | POA: Insufficient documentation

## 2022-02-14 LAB — CBG MONITORING, ED
Glucose-Capillary: 361 mg/dL — ABNORMAL HIGH (ref 70–99)
Glucose-Capillary: 290 mg/dL — ABNORMAL HIGH (ref 70–99)

## 2022-02-14 LAB — URINALYSIS, ROUTINE W REFLEX MICROSCOPIC
Bacteria, UA: NONE SEEN
Bilirubin Urine: NEGATIVE
Glucose, UA: >=500 mg/dL — AB
Hgb urine dipstick: NEGATIVE
Ketones, ur: 5 mg/dL — AB
Leukocytes,Ua: NEGATIVE
Nitrite: NEGATIVE
Protein, ur: NEGATIVE mg/dL
Specific Gravity, Urine: 1.018 (ref 1.005–1.030)
pH: 6 (ref 5.0–8.0)

## 2022-02-14 LAB — CBC WITH DIFFERENTIAL/PLATELET
Abs Immature Granulocytes: 0.01 10*3/uL (ref 0.00–0.07)
Basophils Absolute: 0 10*3/uL (ref 0.0–0.1)
Basophils Relative: 1 %
Eosinophils Absolute: 0.1 10*3/uL (ref 0.0–0.5)
Eosinophils Relative: 2 %
HCT: 35.3 % — ABNORMAL LOW (ref 36.0–46.0)
Hemoglobin: 11.1 g/dL — ABNORMAL LOW (ref 12.0–15.0)
Immature Granulocytes: 0 %
Lymphocytes Relative: 42 %
Lymphs Abs: 1.7 10*3/uL (ref 0.7–4.0)
MCH: 27.3 pg (ref 26.0–34.0)
MCHC: 31.4 g/dL (ref 30.0–36.0)
MCV: 86.9 fL (ref 80.0–100.0)
Monocytes Absolute: 0.2 10*3/uL (ref 0.1–1.0)
Monocytes Relative: 5 %
Neutro Abs: 2 10*3/uL (ref 1.7–7.7)
Neutrophils Relative %: 50 %
Platelets: 266 10*3/uL (ref 150–400)
RBC: 4.06 MIL/uL (ref 3.87–5.11)
RDW: 12.9 % (ref 11.5–15.5)
WBC: 4.1 10*3/uL (ref 4.0–10.5)
nRBC: 0 % (ref 0.0–0.2)

## 2022-02-14 LAB — COMPREHENSIVE METABOLIC PANEL
ALT: 16 U/L (ref 0–44)
AST: 14 U/L — ABNORMAL LOW (ref 15–41)
Albumin: 3.5 g/dL (ref 3.5–5.0)
Alkaline Phosphatase: 97 U/L (ref 38–126)
Anion gap: 8 (ref 5–15)
BUN: 20 mg/dL (ref 6–20)
CO2: 25 mmol/L (ref 22–32)
Calcium: 9 mg/dL (ref 8.9–10.3)
Chloride: 102 mmol/L (ref 98–111)
Creatinine, Ser: 0.69 mg/dL (ref 0.44–1.00)
GFR, Estimated: >60 mL/min (ref >60)
Glucose, Bld: 388 mg/dL — ABNORMAL HIGH (ref 70–99)
Potassium: 3.6 mmol/L (ref 3.5–5.1)
Sodium: 135 mmol/L (ref 135–145)
Total Bilirubin: 0.5 mg/dL (ref 0.3–1.2)
Total Protein: 7.2 g/dL (ref 6.5–8.1)

## 2022-02-14 LAB — BLOOD GAS, VENOUS
Acid-Base Excess: 0.8 mmol/L (ref 0.0–2.0)
Bicarbonate: 26.6 mmol/L (ref 20.0–28.0)
O2 Saturation: 64.8 %
Patient temperature: 37
pCO2, Ven: 46 mmHg (ref 44–60)
pH, Ven: 7.37 (ref 7.25–7.43)
pO2, Ven: 37 mmHg (ref 32–45)

## 2022-02-14 LAB — LIPASE, BLOOD: Lipase: 43 U/L (ref 11–51)

## 2022-02-14 LAB — BETA-HYDROXYBUTYRIC ACID: Beta-Hydroxybutyric Acid: 0.26 mmol/L (ref 0.05–0.27)

## 2022-02-14 MED ORDER — LACTATED RINGERS IV BOLUS
1000.0000 mL | Freq: Once | INTRAVENOUS | Status: AC
  Administered 2022-02-14: 1000 mL via INTRAVENOUS

## 2022-02-14 MED ORDER — METFORMIN HCL 500 MG PO TABS
500.0000 mg | ORAL_TABLET | Freq: Once | ORAL | Status: AC
  Administered 2022-02-14: 500 mg via ORAL
  Filled 2022-02-14: qty 1

## 2022-02-14 NOTE — Discharge Instructions (Signed)
You are seen here today for your blood sugar.  Was mildly elevated but improved with fluids.  Please take the metformin prescribed your outpatient pharmacy at your ER visit 2 days ago and return to the ER with any new severe symptoms.
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
## 2022-02-14 NOTE — ED Triage Notes (Signed)
Pt BIB [facility] from [facility] d/t feel like her sugar was high, pt has no monitor to check her CBG. EMS reports CBG 438. Pt alert on arrival, no complaints, VSS per EMS.

## 2022-02-17 ENCOUNTER — Emergency Department (HOSPITAL_COMMUNITY)
Admission: EM | Admit: 2022-02-17 | Discharge: 2022-02-17 | Disposition: A | Discharge status: Home / Self Care | Payer: Medicaid Other | Attending: Emergency Medicine | Admitting: Emergency Medicine

## 2022-02-17 ENCOUNTER — Encounter (HOSPITAL_COMMUNITY): Payer: Self-pay | Admitting: Emergency Medicine

## 2022-02-17 ENCOUNTER — Other Ambulatory Visit: Payer: Self-pay

## 2022-02-17 DIAGNOSIS — F1910 Other psychoactive substance abuse, uncomplicated: Secondary | ICD-10-CM | POA: Insufficient documentation

## 2022-02-17 DIAGNOSIS — Z91040 Latex allergy status: Secondary | ICD-10-CM | POA: Insufficient documentation

## 2022-02-17 DIAGNOSIS — Y900 Blood alcohol level of less than 20 mg/100 ml: Secondary | ICD-10-CM | POA: Diagnosis not present

## 2022-02-17 DIAGNOSIS — F1410 Cocaine abuse, uncomplicated: Secondary | ICD-10-CM | POA: Diagnosis not present

## 2022-02-17 LAB — COMPREHENSIVE METABOLIC PANEL
ALT: 17 U/L (ref 0–44)
AST: 14 U/L — ABNORMAL LOW (ref 15–41)
Albumin: 3.6 g/dL (ref 3.5–5.0)
Alkaline Phosphatase: 92 U/L (ref 38–126)
Anion gap: 7 (ref 5–15)
BUN: 16 mg/dL (ref 6–20)
CO2: 28 mmol/L (ref 22–32)
Calcium: 9.5 mg/dL (ref 8.9–10.3)
Chloride: 103 mmol/L (ref 98–111)
Creatinine, Ser: 0.83 mg/dL (ref 0.44–1.00)
GFR, Estimated: >60 mL/min (ref >60)
Glucose, Bld: 338 mg/dL — ABNORMAL HIGH (ref 70–99)
Potassium: 3.9 mmol/L (ref 3.5–5.1)
Sodium: 138 mmol/L (ref 135–145)
Total Bilirubin: 0.6 mg/dL (ref 0.3–1.2)
Total Protein: 7.3 g/dL (ref 6.5–8.1)

## 2022-02-17 LAB — RAPID URINE DRUG SCREEN, HOSP PERFORMED
Amphetamines: NOT DETECTED
Barbiturates: NOT DETECTED
Benzodiazepines: NOT DETECTED
Cocaine: POSITIVE — AB
Opiates: NOT DETECTED
Tetrahydrocannabinol: NOT DETECTED

## 2022-02-17 LAB — CBC
HCT: 35.5 % — ABNORMAL LOW (ref 36.0–46.0)
Hemoglobin: 11.1 g/dL — ABNORMAL LOW (ref 12.0–15.0)
MCH: 27.1 pg (ref 26.0–34.0)
MCHC: 31.3 g/dL (ref 30.0–36.0)
MCV: 86.6 fL (ref 80.0–100.0)
Platelets: 309 10*3/uL (ref 150–400)
RBC: 4.1 MIL/uL (ref 3.87–5.11)
RDW: 13.1 % (ref 11.5–15.5)
WBC: 5.4 10*3/uL (ref 4.0–10.5)
nRBC: 0 % (ref 0.0–0.2)

## 2022-02-17 LAB — I-STAT BETA HCG BLOOD, ED (MC, WL, AP ONLY): I-stat hCG, quantitative: <5 m[IU]/mL (ref <5)

## 2022-02-17 LAB — SALICYLATE LEVEL: Salicylate Lvl: <7 mg/dL — ABNORMAL LOW (ref 7.0–30.0)

## 2022-02-17 LAB — ETHANOL: Alcohol, Ethyl (B): <10 mg/dL (ref <10)

## 2022-02-17 LAB — ACETAMINOPHEN LEVEL: Acetaminophen (Tylenol), Serum: <10 ug/mL — ABNORMAL LOW (ref 10–30)

## 2022-02-17 NOTE — Discharge Instructions (Addendum)
RESOURCE GUIDE ° °Chronic Pain Problems: °Contact [facility]  [phone] °Patients need to be referred by their primary care doctor. ° °Insufficient Money for Medicine: °Contact [employer]:  call "211."  ° °No Primary Care Doctor: °- Call [facility]  [phone] - can help you locate a primary care doctor that  accepts your insurance, provides certain services, etc. °- Physician Referral Service- 1-800-533-3463 ° °Agencies that provide inexpensive medical care: °- [facility]  [phone] °- [facility]  [phone] °- [facility]  [phone] °- [facility]  [phone] °- [facility]  [phone] °- [facility]  [phone] ° °Medicaid-accepting [place] Providers: °- [facility]- [address] ° [phone], Mon-Fri 9am-7pm, Sat 9am-1pm °- [facility]- [address], [address] ° [phone] °- [facility]- [address], [address] ° [phone] °- [facility]- [address] [place] [address] ° [phone] °- [facility]- [address], [address], [phone] ° Only accepts [place] Access Medicaid patients after they have their name  applied to their card ° °Self Pay (no insurance) in [place]: °- Sickle Cell Patients: Dr Eric Dean, [facility] ° [address], [phone] °- [facility]- [address] ° [phone] °      -     [facility]- [address] °      -     [facility]- see information above (Speak to Pam H if you do not have insurance) °      -  [facility] [place]- [address],  [phone] °      -  [facility]- [address] [place] [address], [phone] °      -  Dr Osei-Bonsu-  [address], [place], [phone] °      -  [facility] - [address], [phone] °      -  [facility]- [address] [place] [address], [phone],  also [address] °  [address], [phone] °      -    [facility]- [address], [phone], 1st & 3rd Saturday °       every month, 10am-1pm ° °[facility] °[address] °[place], [place] [address] °[phone] ° °[facility] °[address] °[place], [place] [address] °([phone] ° °1) Find a Doctor and Pay Out of Pocket °Although you won't have to find out who is covered by your insurance plan, it is a good idea to ask around and get recommendations. You will then need to call the office and see if the doctor you have chosen will accept you as a new patient and what types of options they offer for patients who are self-pay. Some doctors offer discounts or will set up payment plans for their patients who do not have insurance, but you will need to ask so you aren't surprised when you get to your appointment. ° °2) Contact Your Local Health Department °Not all health departments have doctors that can see patients for sick visits, but many do, so it is worth a call to see if yours does. If you don't know where your local health department is, you can check in your phone book. The CDC also has a tool   to help you locate your state's health department, and many state websites also have listings of all of their local health departments. ° °3) Find a Walk-in Clinic °If your illness is not likely to be very severe or complicated, you may want to try a walk in clinic. These are popping up all over the country in pharmacies, drugstores, and shopping centers. They're usually staffed by nurse practitioners or physician assistants that have been trained to treat common illnesses and complaints. They're usually fairly quick and inexpensive. However, if you have serious medical issues or chronic medical problems, these are probably not your best option ° °STD Testing °- [place] [facility] [place], [facility], [address], [place],  phone [phone] or [phone].  Monday - Friday, call for an appointment. °- [place] [place], [facility], [address], [place], phone [phone] or [phone].  Monday - Friday, call for an appointment. ° °Abuse/Neglect: °- [place] Child Abuse Hotline (336) 641-3795 °- [place] Child Abuse Hotline 800-378-5315 (After Hours) ° °Emergency Shelter:  [facility] (336) 271-5985 ° °Maternity Homes: °- [facility] (336) 275-9566 °- [facility] (704) 372-4663 ° °MRSA Hotline #:   832-7006 ° °Dental Assistance °If unable to pay or uninsured, contact:  [place] [facility] to become qualified for the adult dental clinic. ° °Patients with Medicaid: [facility] °[address], [phone] °[address], [phone] ° °If unable to pay, or uninsured, contact [place] [facility] ([phone] in [place], [phone] in [place]) to become qualified for the adult dental clinic ° °[facility] °[address] °[place], [place] [address] °(336) 272-4177 °[email or web address] ° °Other Low-Cost Community Dental Services: °- [facility]- [address], [place], [place], [address], [phone], Ext. 123, 2nd and 4th Thursday of the month at 6:30am.  10 clients each day by appointment, can sometimes see walk-in patients if someone does not show for an appointment. °- [facility]- [address], [place], [place], [address], [phone] °- [facility]- [address], [place], [place], [address], [phone] °- [facility]- [phone] °- [facility]- [phone] °- [facility]- [phone] °-  °

## 2022-02-17 NOTE — ED Triage Notes (Addendum)
Patient from [facility] BIB [facility] w/ request to detox from drug use. Patient states "I've smoked crack for 30 years and I go around in circles and I want to stop this time for good".  EMS stated initially that patient was suicidal ideation, but is really experiencing HI, stating "its the crack talking".  BP- 178/100  CBG-342

## 2022-02-17 NOTE — ED Notes (Signed)
Pt provided broth, soda and toothbrush/tooth paste along with discharge paperwork.

## 2022-02-17 NOTE — ED Provider Notes (Signed)
  [facility] EMERGENCY DEPARTMENT Provider Note   CSN: [id number] Arrival date & time: 02/17/22  1814     History {Add pertinent medical, surgical, social history, OB history to HPI:1} Chief Complaint  Patient presents with   Drug Problem   Homicidal    Katherine Franklin is a 56 y.o. female.   Drug Problem       Home Medications Prior to Admission medications   Medication Sig Start Date End Date Taking? Authorizing Provider  lisinopril (ZESTRIL) 20 MG tablet Take 1 tablet (20 mg total) by mouth daily. 01/29/22   Molpus, John, MD  metFORMIN (GLUCOPHAGE-XR) 500 MG 24 hr tablet Take 4 tablets (2,000 mg total) by mouth every morning. 01/29/22 02/28/22  Molpus, Jenny Reichmann, MD  ondansetron (ZOFRAN) 8 MG tablet Take 1 tablet (8 mg total) by mouth every 8 (eight) hours as needed for nausea or vomiting. 01/29/22   Molpus, John, MD  pantoprazole (PROTONIX) 40 MG tablet Take 1 tablet (40 mg total) by mouth daily. 01/29/22   Molpus, John, MD      Allergies    Sulfa antibiotics and Latex    Review of Systems   Review of Systems  Physical Exam Updated Vital Signs BP (!) 170/94   Pulse 90   Temp 98.4 F (36.9 C)   Resp 18   SpO2 98%  Physical Exam  ED Results / Procedures / Treatments   Labs (all labs ordered are listed, but only abnormal results are displayed) Labs Reviewed  COMPREHENSIVE METABOLIC PANEL - Abnormal; Notable for the following components:      Result Value   Glucose, Bld 338 (*)    AST 14 (*)    All other components within normal limits  SALICYLATE LEVEL - Abnormal; Notable for the following components:   Salicylate Lvl <1.6 (*)    All other components within normal limits  ACETAMINOPHEN LEVEL - Abnormal; Notable for the following components:   Acetaminophen (Tylenol), Serum <10 (*)    All other components within normal limits  CBC - Abnormal; Notable for the following components:   Hemoglobin 11.1 (*)    HCT 35.5 (*)    All other components  within normal limits  ETHANOL  RAPID URINE DRUG SCREEN, HOSP PERFORMED  I-STAT BETA HCG BLOOD, ED ([facility], [facility], [facility] ONLY)    EKG None  Radiology No results found.  Procedures Procedures  {Document cardiac monitor, telemetry assessment procedure when appropriate:1}  Medications Ordered in ED Medications - No data to display  ED Course/ Medical Decision Making/ A&P                           Medical Decision Making Amount and/or Complexity of Data Reviewed Labs: ordered.   ***  {Document critical care time when appropriate:1} {Document review of labs and clinical decision tools ie heart score, Chads2Vasc2 etc:1}  {Document your independent review of radiology images, and any outside records:1} {Document your discussion with family members, caretakers, and with consultants:1} {Document social determinants of health affecting pt's care:1} {Document your decision making why or why not admission, treatments were needed:1} Final Clinical Impression(s) / ED Diagnoses Final diagnoses:  None    Rx / DC Orders ED Discharge Orders     None

## 2022-02-21 ENCOUNTER — Emergency Department (HOSPITAL_COMMUNITY)
Admission: EM | Admit: 2022-02-21 | Discharge: 2022-02-21 | Disposition: A | Discharge status: Home / Self Care | Payer: Medicaid Other | Attending: Emergency Medicine | Admitting: Emergency Medicine

## 2022-02-21 DIAGNOSIS — Z5900 Homelessness unspecified: Secondary | ICD-10-CM | POA: Diagnosis not present

## 2022-02-21 DIAGNOSIS — E1165 Type 2 diabetes mellitus with hyperglycemia: Secondary | ICD-10-CM | POA: Diagnosis not present

## 2022-02-21 DIAGNOSIS — I10 Essential (primary) hypertension: Secondary | ICD-10-CM | POA: Insufficient documentation

## 2022-02-21 DIAGNOSIS — Z91040 Latex allergy status: Secondary | ICD-10-CM | POA: Diagnosis not present

## 2022-02-21 DIAGNOSIS — Z79899 Other long term (current) drug therapy: Secondary | ICD-10-CM | POA: Insufficient documentation

## 2022-02-21 DIAGNOSIS — Z7984 Long term (current) use of oral hypoglycemic drugs: Secondary | ICD-10-CM | POA: Insufficient documentation

## 2022-02-21 DIAGNOSIS — F1410 Cocaine abuse, uncomplicated: Secondary | ICD-10-CM | POA: Diagnosis present

## 2022-02-21 DIAGNOSIS — R739 Hyperglycemia, unspecified: Secondary | ICD-10-CM

## 2022-02-21 LAB — I-STAT CHEM 8, ED
BUN: 17 mg/dL (ref 6–20)
Calcium, Ion: 1.21 mmol/L (ref 1.15–1.40)
Chloride: 103 mmol/L (ref 98–111)
Creatinine, Ser: 0.6 mg/dL (ref 0.44–1.00)
Glucose, Bld: 340 mg/dL — ABNORMAL HIGH (ref 70–99)
HCT: 34 % — ABNORMAL LOW (ref 36.0–46.0)
Hemoglobin: 11.6 g/dL — ABNORMAL LOW (ref 12.0–15.0)
Potassium: 4.3 mmol/L (ref 3.5–5.1)
Sodium: 139 mmol/L (ref 135–145)
TCO2: 24 mmol/L (ref 22–32)

## 2022-02-21 LAB — CBG MONITORING, ED: Glucose-Capillary: 334 mg/dL — ABNORMAL HIGH (ref 70–99)

## 2022-02-21 MED ORDER — METFORMIN HCL 500 MG PO TABS
500.0000 mg | ORAL_TABLET | Freq: Once | ORAL | Status: AC
  Administered 2022-02-21: 500 mg via ORAL
  Filled 2022-02-21: qty 1

## 2022-02-21 NOTE — ED Provider Notes (Signed)
[facility]-EMERGENCY DEPT Provider Note   CSN: [id number] Arrival date & time: 02/21/22  6222     History  Chief Complaint  Patient presents with   Addiction Problem    Katherine Franklin is a 56 y.o. female.  HPI     This is a 56 year old female who presents with concerns for high blood sugar.  Patient states she is really here because she is homeless and it is cold outside.  She states that she does not have a place to stay.  She reports that shelters are mean to her and that she has been banned from the bus route.  She does report ongoing crack cocaine use.  She states "I been trying to get help but nobody will help me."  She denies SI or HI.  Patient states that her blood sugars were high and "I thought I would kill 2 birds and take a load off of my feet."  She does not have access to her diabetes medication.  She states that she is able to afford the $3 co-pay but needs to get to [facility] to get her medications.  She denies chest pain, shortness of breath, abdominal pain, nausea, vomiting.  Was just seen and evaluated 10/7 requesting help with detox.  Denies SI or HI.  Home Medications Prior to Admission medications   Medication Sig Start Date End Date Taking? Authorizing Provider  lisinopril (ZESTRIL) 20 MG tablet Take 1 tablet (20 mg total) by mouth daily. 01/29/22   Molpus, John, MD  metFORMIN (GLUCOPHAGE-XR) 500 MG 24 hr tablet Take 4 tablets (2,000 mg total) by mouth every morning. 01/29/22 02/28/22  Molpus, Jonny Ruiz, MD  ondansetron (ZOFRAN) 8 MG tablet Take 1 tablet (8 mg total) by mouth every 8 (eight) hours as needed for nausea or vomiting. 01/29/22   Molpus, John, MD  pantoprazole (PROTONIX) 40 MG tablet Take 1 tablet (40 mg total) by mouth daily. 01/29/22   Molpus, John, MD      Allergies    Sulfa antibiotics and Latex    Review of Systems   Review of Systems  Constitutional:  Negative for fever.  Respiratory:  Negative for shortness of breath.    Cardiovascular:  Negative for chest pain.  All other systems reviewed and are negative.   Physical Exam Updated Vital Signs BP (!) 150/85   Pulse 89   Temp 98.2 F (36.8 C) (Oral)   Resp 20   SpO2 100%  Physical Exam Vitals and nursing note reviewed.  Constitutional:      Appearance: She is well-developed.     Comments: Disheveled, no acute distress  HENT:     Head: Normocephalic and atraumatic.     Nose: Nose normal.     Mouth/Throat:     Mouth: Mucous membranes are dry.  Eyes:     Pupils: Pupils are equal, round, and reactive to light.     Comments: Bilateral injected conjunctiva  Cardiovascular:     Rate and Rhythm: Normal rate and regular rhythm.  Pulmonary:     Effort: Pulmonary effort is normal. No respiratory distress.  Abdominal:     Palpations: Abdomen is soft.  Musculoskeletal:     Cervical back: Neck supple.  Skin:    General: Skin is warm and dry.  Neurological:     Mental Status: She is alert and oriented to person, place, and time.     ED Results / Procedures / Treatments   Labs (all labs ordered are listed, but  only abnormal results are displayed) Labs Reviewed  CBG MONITORING, ED - Abnormal; Notable for the following components:      Result Value   Glucose-Capillary 334 (*)    All other components within normal limits  I-STAT CHEM 8, ED - Abnormal; Notable for the following components:   Glucose, Bld 340 (*)    Hemoglobin 11.6 (*)    HCT 34.0 (*)    All other components within normal limits    EKG None  Radiology No results found.  Procedures Procedures    Medications Ordered in ED Medications  metFORMIN (GLUCOPHAGE) tablet 500 mg (has no administration in time range)    ED Course/ Medical Decision Making/ A&P                           Medical Decision Making Risk Prescription drug management.   This patient presents to the ED for concern of hyperglycemia, this involves an extensive number of treatment options, and is a  complaint that carries with it a high risk of complications and morbidity.  I considered the following differential and admission for this acute, potentially life threatening condition.  The differential diagnosis includes simple hyperglycemia, complications such as DKA, malingering, substance abuse  MDM:    This is a 56 year old female who presents with concern for hyperglycemia.  She also was transparent and that she was cold outside and did not have a place to stay.  She has frequent evaluations for polysubstance abuse and homelessness.  CBG is in the 300s.  This is consistent with her baseline.  Chem-8 does not reveal an anion gap.  She was given 1 dose of metformin which is her home diabetes medication.  She was counseled regarding substance abuse.  She will be given information for homeless shelters.  I have referred her back to the prior resources provided for polysubstance abuse.  At this time she does not appear to be a harm to herself or others.  She has capacity to make medical decisions.  Do not feel that she needs further work-up.  (Labs, imaging, consults)  Labs: I Ordered, and personally interpreted labs.  The pertinent results include: CBG, Chem-8  Imaging Studies ordered: I ordered imaging studies including none I independently visualized and interpreted imaging. I agree with the radiologist interpretation  Additional history obtained from review.  External records from outside source obtained and reviewed including prior evaluations  Cardiac Monitoring: The patient was maintained on a cardiac monitor.  I personally viewed and interpreted the cardiac monitored which showed an underlying rhythm of: Normal sinus rhythm  Reevaluation: After the interventions noted above, I reevaluated the patient and found that they have :stayed the same  Social Determinants of Health: Homeless, polysubstance abuse  Disposition: Discharge  Co morbidities that complicate the patient  evaluation  Past Medical History:  Diagnosis Date   Diabetes mellitus without complication (HCC)    Drug abuse (HCC) 09/22/2018   GERD (gastroesophageal reflux disease)    Hypertension    PTSD (post-traumatic stress disorder)    Schizo affective schizophrenia (HCC)      Medicines Meds ordered this encounter  Medications   metFORMIN (GLUCOPHAGE) tablet 500 mg    I have reviewed the patients home medicines and have made adjustments as needed  Problem List / ED Course: Problem List Items Addressed This Visit       Other   Homelessness   Other Visit Diagnoses     Cocaine  abuse ([place])    -  Primary   Hyperglycemia                       Final Clinical Impression(s) / ED Diagnoses Final diagnoses:  Cocaine abuse ([place])  Homelessness  Hyperglycemia    Rx / DC Orders ED Discharge Orders     None         Tinlee Navarrette, Barbette Hair, MD 02/21/22 0532

## 2022-02-21 NOTE — Discharge Instructions (Addendum)
You were seen today with concerns for high blood sugar.  Your blood sugar is elevated.  Make sure that you are staying hydrated with nonglucose containing fluids.  Review resources provided for shelter options.  Reviewed prior resources for options for substance abuse help.

## 2022-02-24 ENCOUNTER — Emergency Department (HOSPITAL_COMMUNITY)
Admission: EM | Admit: 2022-02-24 | Discharge: 2022-02-25 | Disposition: A | Discharge status: Home / Self Care | Payer: Medicaid Other | Attending: Emergency Medicine | Admitting: Emergency Medicine

## 2022-02-24 ENCOUNTER — Other Ambulatory Visit: Payer: Self-pay

## 2022-02-24 ENCOUNTER — Emergency Department (HOSPITAL_COMMUNITY): Payer: Medicaid Other

## 2022-02-24 ENCOUNTER — Encounter (HOSPITAL_COMMUNITY): Payer: Self-pay | Admitting: *Deleted

## 2022-02-24 DIAGNOSIS — R739 Hyperglycemia, unspecified: Secondary | ICD-10-CM | POA: Diagnosis not present

## 2022-02-24 DIAGNOSIS — Z7984 Long term (current) use of oral hypoglycemic drugs: Secondary | ICD-10-CM | POA: Insufficient documentation

## 2022-02-24 DIAGNOSIS — R079 Chest pain, unspecified: Secondary | ICD-10-CM | POA: Diagnosis not present

## 2022-02-24 DIAGNOSIS — T40411A Poisoning by fentanyl or fentanyl analogs, accidental (unintentional), initial encounter: Secondary | ICD-10-CM | POA: Diagnosis present

## 2022-02-24 DIAGNOSIS — T50901A Poisoning by unspecified drugs, medicaments and biological substances, accidental (unintentional), initial encounter: Secondary | ICD-10-CM

## 2022-02-24 DIAGNOSIS — Z91040 Latex allergy status: Secondary | ICD-10-CM | POA: Insufficient documentation

## 2022-02-24 LAB — CBC WITH DIFFERENTIAL/PLATELET
Abs Immature Granulocytes: 0.02 10*3/uL (ref 0.00–0.07)
Basophils Absolute: 0 10*3/uL (ref 0.0–0.1)
Basophils Relative: 1 %
Eosinophils Absolute: 0.2 10*3/uL (ref 0.0–0.5)
Eosinophils Relative: 3 %
HCT: 33.3 % — ABNORMAL LOW (ref 36.0–46.0)
Hemoglobin: 11.1 g/dL — ABNORMAL LOW (ref 12.0–15.0)
Immature Granulocytes: 0 %
Lymphocytes Relative: 34 %
Lymphs Abs: 1.9 10*3/uL (ref 0.7–4.0)
MCH: 28.3 pg (ref 26.0–34.0)
MCHC: 33.3 g/dL (ref 30.0–36.0)
MCV: 84.9 fL (ref 80.0–100.0)
Monocytes Absolute: 0.5 10*3/uL (ref 0.1–1.0)
Monocytes Relative: 8 %
Neutro Abs: 3 10*3/uL (ref 1.7–7.7)
Neutrophils Relative %: 54 %
Platelets: 310 10*3/uL (ref 150–400)
RBC: 3.92 MIL/uL (ref 3.87–5.11)
RDW: 13.2 % (ref 11.5–15.5)
WBC: 5.6 10*3/uL (ref 4.0–10.5)
nRBC: 0 % (ref 0.0–0.2)

## 2022-02-24 LAB — COMPREHENSIVE METABOLIC PANEL
ALT: 23 U/L (ref 0–44)
AST: 21 U/L (ref 15–41)
Albumin: 3.4 g/dL — ABNORMAL LOW (ref 3.5–5.0)
Alkaline Phosphatase: 87 U/L (ref 38–126)
Anion gap: 10 (ref 5–15)
BUN: 17 mg/dL (ref 6–20)
CO2: 24 mmol/L (ref 22–32)
Calcium: 9.3 mg/dL (ref 8.9–10.3)
Chloride: 102 mmol/L (ref 98–111)
Creatinine, Ser: 0.77 mg/dL (ref 0.44–1.00)
GFR, Estimated: >60 mL/min (ref >60)
Glucose, Bld: 290 mg/dL — ABNORMAL HIGH (ref 70–99)
Potassium: 3.7 mmol/L (ref 3.5–5.1)
Sodium: 136 mmol/L (ref 135–145)
Total Bilirubin: 0.6 mg/dL (ref 0.3–1.2)
Total Protein: 6.5 g/dL (ref 6.5–8.1)

## 2022-02-24 LAB — RAPID URINE DRUG SCREEN, HOSP PERFORMED
Amphetamines: NOT DETECTED
Barbiturates: NOT DETECTED
Benzodiazepines: NOT DETECTED
Cocaine: POSITIVE — AB
Opiates: NOT DETECTED
Tetrahydrocannabinol: NOT DETECTED

## 2022-02-24 LAB — ETHANOL: Alcohol, Ethyl (B): <10 mg/dL (ref <10)

## 2022-02-24 LAB — CBG MONITORING, ED: Glucose-Capillary: 305 mg/dL — ABNORMAL HIGH (ref 70–99)

## 2022-02-24 LAB — TROPONIN I (HIGH SENSITIVITY)
Troponin I (High Sensitivity): 4 ng/L (ref <18)
Troponin I (High Sensitivity): 4 ng/L (ref <18)

## 2022-02-24 NOTE — ED Triage Notes (Signed)
Pt here via [facility] after calling 911 worried that she had ingested fentanyl with her usual cocaine.  Per [facility], pt was hysterical upon their arrival, but has since calmed down.  Pt has hx of schizophrenia and does not take medictions.

## 2022-02-24 NOTE — ED Notes (Signed)
Patient ambulates to restroom without assistant

## 2022-02-24 NOTE — ED Provider Notes (Signed)
[facility] EMERGENCY DEPARTMENT Provider Note   CSN: [id number] Arrival date & time: 02/24/22  1946     History  Chief Complaint  Patient presents with   Drug Overdose    Katherine Franklin is a 56 y.o. female.  HPI 56 year old female presents after a drug overdose.  History is limited as EMS is not present when I am speaking to the patient.  However nurse notes were reviewed and indicate that the patient feels like she was given fentanyl when she was trying to use cocaine tonight.  She was apparently agitated at first though has calm down.   Patient tells me that she feels like someone gave her fentanyl instead of the cocaine.  She smoked what ever substance was given and shortly thereafter was experiencing chest pressure and throat burning.  Overall lasted 15-20 minutes.  She also states she was hallucinating which was different than her typical feeling she gets when she uses cocaine.  Currently not having any chest pain or shortness of breath.  Home Medications Prior to Admission medications   Medication Sig Start Date End Date Taking? Authorizing Provider  lisinopril (ZESTRIL) 20 MG tablet Take 1 tablet (20 mg total) by mouth daily. 01/29/22   Molpus, John, MD  metFORMIN (GLUCOPHAGE-XR) 500 MG 24 hr tablet Take 4 tablets (2,000 mg total) by mouth every morning. 01/29/22 02/28/22  Molpus, Jonny Ruiz, MD  ondansetron (ZOFRAN) 8 MG tablet Take 1 tablet (8 mg total) by mouth every 8 (eight) hours as needed for nausea or vomiting. 01/29/22   Molpus, John, MD  pantoprazole (PROTONIX) 40 MG tablet Take 1 tablet (40 mg total) by mouth daily. 01/29/22   Molpus, John, MD      Allergies    Sulfa antibiotics and Latex    Review of Systems   Review of Systems  Unable to perform ROS: Mental status change    Physical Exam Updated Vital Signs BP 126/88   Pulse 95   Temp 98 F (36.7 C) (Temporal)   Resp 14   Ht 5\' 9"  (1.753 m)   Wt 108.9 kg   SpO2 96%   BMI 35.44 kg/m   Physical Exam Vitals and nursing note reviewed.  Constitutional:      Appearance: She is well-developed.  HENT:     Head: Normocephalic and atraumatic.  Eyes:     Comments: Pupils mildly small bilaterally, difficult to get a good look due to patient compliance  Cardiovascular:     Rate and Rhythm: Normal rate and regular rhythm.     Heart sounds: Normal heart sounds.  Pulmonary:     Effort: Pulmonary effort is normal.     Breath sounds: Normal breath sounds.  Abdominal:     Palpations: Abdomen is soft.     Tenderness: There is no abdominal tenderness.  Skin:    General: Skin is warm and dry.  Neurological:     Mental Status: She is oriented to person, place, and time.     Comments: Patient is sleeping. She easily wakes up but will relatively quickly fall back asleep, even while talking to me. However she has equal strength in all 4 extremities.     ED Results / Procedures / Treatments   Labs (all labs ordered are listed, but only abnormal results are displayed) Labs Reviewed  COMPREHENSIVE METABOLIC PANEL - Abnormal; Notable for the following components:      Result Value   Glucose, Bld 290 (*)    Albumin 3.4 (*)  All other components within normal limits  CBC WITH DIFFERENTIAL/PLATELET - Abnormal; Notable for the following components:   Hemoglobin 11.1 (*)    HCT 33.3 (*)    All other components within normal limits  CBG MONITORING, ED - Abnormal; Notable for the following components:   Glucose-Capillary 305 (*)    All other components within normal limits  ETHANOL  RAPID URINE DRUG SCREEN, HOSP PERFORMED  TROPONIN I (HIGH SENSITIVITY)  TROPONIN I (HIGH SENSITIVITY)    EKG EKG Interpretation  Date/Time:  Saturday February 24 2022 21:15:12 EDT Ventricular Rate:  95 PR Interval:  152 QRS Duration: 78 QT Interval:  361 QTC Calculation: 454 R Axis:   43 Text Interpretation: Sinus rhythm no acute ST/T changes similar to Sept 2023 Confirmed by Sherwood Gambler  [phone]) on 02/24/2022 10:05:53 PM  Radiology DG Chest Portable 1 View  Result Date: 02/24/2022 CLINICAL DATA:  chest pain, cocaine EXAM: PORTABLE CHEST 1 VIEW COMPARISON:  04/19/2019 FINDINGS: Heart and mediastinal contours are within normal limits. No focal opacities or effusions. No acute bony abnormality. IMPRESSION: No active disease. Electronically Signed   By: Rolm Baptise M.D.   On: 02/24/2022 20:20    Procedures Procedures    Medications Ordered in ED Medications - No data to display  ED Course/ Medical Decision Making/ A&P                           Medical Decision Making Amount and/or Complexity of Data Reviewed Labs: ordered.    Details: First trop normal. Hyperglycemia without acidosis. Normal ETOH Radiology: ordered and independent interpretation performed.    Details: No CHF ECG/medicine tests: ordered and independent interpretation performed.    Details: No acute ischemia   Patient presents with likely acute drug ingestion/intoxication. She thinks someone put fentanyl in her drugs but is unclear if this was truly the case.  She is maintaining her airway but is pretty sleepy and easily falls asleep.  However otherwise, no emergent conditions have been found.  She did transiently complain of chest pain and did abuse cocaine so troponins have been sent and the first 1 is normal.  She is hyperglycemic but on chart review this is not atypical for her.  For now, will need to metabolize and see the second troponin. Care transferred to Dr. Betsey Holiday.        Final Clinical Impression(s) / ED Diagnoses Final diagnoses:  None    Rx / DC Orders ED Discharge Orders     None         Sherwood Gambler, MD 02/24/22 2319

## 2022-02-25 NOTE — ED Notes (Signed)
Patient woke up hollering "I need a nurse. I need food. I need some help!" While a opened and lunch bag sitting at bedside table. RN showed patient how to use call button. Pt demanded to used restroom. Ambulated well to restroom. NAD

## 2022-02-25 NOTE — ED Provider Notes (Signed)
Patient signed out to me to monitor progress.  Patient sedated after accidental drug overdose.  She admits to cocaine, unclear if there are other substances.  Vital signs have been stable.  Patient now awake, yelling that she needs a sandwich.  Appropriate for discharge.   Orpah Greek, MD 02/25/22 [phone]

## 2022-02-26 ENCOUNTER — Ambulatory Visit (HOSPITAL_COMMUNITY)
Admission: EM | Admit: 2022-02-26 | Discharge: 2022-02-26 | Disposition: A | Discharge status: Home / Self Care | Payer: Medicaid Other | Attending: Registered Nurse | Admitting: Registered Nurse

## 2022-02-26 ENCOUNTER — Encounter (HOSPITAL_COMMUNITY): Payer: Self-pay | Admitting: Registered Nurse

## 2022-02-26 DIAGNOSIS — E1140 Type 2 diabetes mellitus with diabetic neuropathy, unspecified: Secondary | ICD-10-CM | POA: Insufficient documentation

## 2022-02-26 DIAGNOSIS — K219 Gastro-esophageal reflux disease without esophagitis: Secondary | ICD-10-CM | POA: Insufficient documentation

## 2022-02-26 DIAGNOSIS — F259 Schizoaffective disorder, unspecified: Secondary | ICD-10-CM | POA: Insufficient documentation

## 2022-02-26 DIAGNOSIS — F1420 Cocaine dependence, uncomplicated: Secondary | ICD-10-CM | POA: Diagnosis present

## 2022-02-26 DIAGNOSIS — Z5900 Homelessness unspecified: Secondary | ICD-10-CM | POA: Insufficient documentation

## 2022-02-26 DIAGNOSIS — F1994 Other psychoactive substance use, unspecified with psychoactive substance-induced mood disorder: Secondary | ICD-10-CM

## 2022-02-26 DIAGNOSIS — F4310 Post-traumatic stress disorder, unspecified: Secondary | ICD-10-CM | POA: Insufficient documentation

## 2022-02-26 DIAGNOSIS — F1410 Cocaine abuse, uncomplicated: Secondary | ICD-10-CM | POA: Diagnosis present

## 2022-02-26 DIAGNOSIS — I10 Essential (primary) hypertension: Secondary | ICD-10-CM | POA: Insufficient documentation

## 2022-02-26 DIAGNOSIS — Z599 Problem related to housing and economic circumstances, unspecified: Secondary | ICD-10-CM

## 2022-02-26 NOTE — Discharge Instructions (Addendum)
[facility] Address: [address], [place], [place] [address] Phone: [phone]  Supported Employment The supported employment program is a person-centered, individualized, evidence-based support service that helps members choose, acquire, and maintain competitive employment in our community. This service supports the varying needs of individuals and promotes community inclusion and employment success. Members enrolled in the supported employment program can expect the following:  Development of an individual career plan Community based job placement Job shadowing Job development On-site job Teacher, early years/pre and support  Supported Education Supported education helps our members receive the education and training they need to achieve their learning and recovery goals. This will assist members with becoming gainfully employed in the job or career of their choice. The program includes assistance with: Registering for disability accommodations Enrolling in school and registering for classes Learning communication skills Scheduling tutoring sessions within your school [facility] partners with Vocational Rehabilitation to help increase the success of clients seeking employment and educational goals.  Want to learn more about our programs?   Please contact our intake department INTAKE: [phone] Ext [phone]  Mailing: [place]   [place], [place] [address]   [email or web address]  Hours Monday - Friday: Services: 8:00AM - 3:00PM Offices: 8:00AM - 5:00PM  Physical Address [place], [place] [address]   Please use this address for [facility] Mailing Address [address] [place], [place] [address]  [facility] helps people reconnect This is a safe place to rest, take care of basic needs and access the services and community that make all the difference. Our guests come to the [facility] to take a class, do laundry, meet with a case  manager or to get their mail. Sometimes they just need to sit in our dayroom and enjoy a conversation.  Here you will find everything from shower facilities to a computer lab, a mail room, classrooms and meeting spaces.  The [facility] helps people reconnect with their own lives and with the community at large.  A caring community setting One of the most exciting aspects of the IRC is that so many individuals and organizations in the community are a part of the everyday experience. Whether it's a hair stylist or law firm offering services right in-house, our partners make the [facility] a truly interactive resource center where services are brought to our guests. The [facility] brings together a comprehensive community of talented people who not only want to help solve problems, but also to be a part of our guests' lives.  Integrated Care We take a person-centered approach to assistance that includes: Case management Green clinic Mental health nurse Referrals  Fundamental Services We start with necessities: Engineer, maintenance (IT) and Risk manager addresses and mailboxes Replacement IDs Onsite barbershop Storage lockers [employer] [place] winter warming center  Self-Sufficiency We connect our guests with: Skilled trade classes Job skills classes Resume and jobs application assistance Interview training GED Associate Professor literacy    [place]: Outpatient psychiatric Services  New Patient Assessment and Therapy Walk-in Monday thru Thursday 8:00 am first come first serve until slots are full Every Friday from 1:00 pm to 4:00 pm first come first serve until slots are full  New Patient Psychiatric Medication Management Monday thru Friday from 8:00 am to 11:00 am first come first served until slots are full  For all walk-ins we ask that you arrive by 7:15 am because patients will be  be seen in there  order of arrival.   Availability is limited, and therefore you may not be seen on the same day that you walk in.  Our goal is to serve and meet the needs of our community to the best of our ability.      Substance Abuse Treatment Programs  Intensive Outpatient Programs [facility]     [address]      [place], [place]                   [phone]       [facility] [address] [place], [place] [phone]  [facility]     (Inpatient and outpatient)     [address]           [phone]    [facility] [phone] (Suboxone and Methadone)  [address]      [place], [place] [address]      [phone]       [address] [place], [place] [phone]  [facility] (Outpatient/Inpatient, [place])    (insurance only) [phone]             [facility] (Groups & Residential) [place], [place] [phone]     [facility]     [address]     [place], [place]      [phone]       [facility] (for caregivers and family) [address] [place], [place] [phone]      Residential Treatment Programs [facility]      [address], [place], [place] [address]  [phone]       [facility] [address] [place], [place] [address] [phone]  [facility]        [phone]       [facility] [phone]  [facility]             [address]                                         [place], [place]                                                [phone] or [phone]                               [facility] [place] [address] [place], [address] [phone]  [facility]    [address]      [place], [place]     [phone]       [facility] [address] [place], [place] [phone]  [facility]   [address]     [place], [place] [address]     [phone]      Admissions: 8am-3pm M-F  [facility] ([facility]) [address] [place], [place] [phone]  [facility]: Residential Program (90 Days)   [place], [place]      [phone] or [phone]     [facility]: [facility] [place],  (Walk in   Hours over the weekend or by referral)  [place] [facility] [address], [place], [place] [address] [phone]  Crisis Mobile: [facility]:  1-877-626-1772 (for crisis response 24 hours a day) [facility] Hotline:      1-800-256-2452 Outpatient Psychiatry and Counseling  [facility]: Mobile Crisis Management 24 hours:  1-877-626-1772  [facility] sliding scale fee and walk in schedule: M-F 8am-12pm/1pm-3pm [address]  [place], [place] [address] [phone]  [facility] [address] [place], [place] [address] [phone]  [facility] ([facility] new patient walk-in appointments available Monday - Friday 8am -3pm.          [address] [place], [place] [address] [phone] or crisis line- [phone]  [facility]/ Intensive Outpatient Therapy Program [address] [place], [place] [address] [phone]  [facility]                  Crisis Services      [phone]      [address]     [place], [place] [address]                 [facility]   [facility] [phone] [address] [place], [place] [address]   [facility]          [address],  [place], [place] [address]       [phone]  [facility] [address] [place], [place] [address] [phone]  [facility]    [address]    [place], [place] [address]     [phone]       Parish McKinney, MD     [address]     [place] [address]     [phone]       [facility] [address] [place] [address]  [facility]     [address]     [place], [place]      [phone]       [facility] Shamsher Ahluwalia, MD [address] [place], [place] [address] [phone]  [facility]     [address]     [place], [place] [address]     [phone]       [facility] [address] [place], [place] [address] [phone] Resources for Temporary Residential Assistance/Crisis Centers  [facility] ([facility]) M-F 8am-3pm   [address] [place], [place] [address]   [phone] Services include: laundry, barbering, support groups, case management, phone  & computer access, showers, AA/NA mtgs, mental health/substance abuse nurse, job skills class, disability information, VA assistance, spiritual classes, etc.   HOMELESS SHELTERS  [facility]     [facility]   [address], [place]     [phone]              [facility] (women and children)       [address] [place],  [address] [phone] [email or web address] for application and process Application Required  [facility]   [address]    [place]  [address]     [phone]                      [facility] [address] [place], [place] [address] [phone](schedule application appt.) Application Required  [facility] (women only)    [address]     [place], [place] [address]     [phone]      Intake starts 6pm daily Need valid ID, SSC, & Police report [facility] [place] [address] [place], [place] [phone] Application Required  [facility] (men only)      [address]      [place], [place]     [phone]       [facility] (Pregnant women only) [address] [place], [place] [phone]  [facility]      [address]      [place], [place] [address]     [phone]             [place] [facility] [address] [place], [place] [phone] 90 day commitment/SA/Application process  [facility](men only)     [address]     [place], [place]     [phone]       Check-in at 7pm            [facility] [address] [place], [place] [address] [phone] Men/Women/Women and Children must be there by 7 pm  [facility] [place],  [phone]                 

## 2022-02-26 NOTE — BH Assessment (Signed)
Care Management   Writer left a HIPPA compliant voice mail message at [facility] in order to obtain a shelter bed for the patient.   Writer was only able to leave a message.  Writer informed the NP working with the patient.   NP reports that she is going to link the patient with the [facility] worker located on the second floor.

## 2022-02-26 NOTE — ED Provider Notes (Signed)
Behavioral Health Urgent Care Medical Screening Exam  Patient Name: Katherine Franklin MRN: [id number] Date of Evaluation: 02/26/22 Chief Complaint:   Diagnosis:  Final diagnoses:  None    History of Present illness: Katherine Franklin is a 56 y.o. female patient presented to [facility] as a walk in voluntary via [facility] Police requesting outpatient psychiatric services and rehab services  Katherine Franklin, 56 y.o., female patient seen face to face by this provider, consulted with Dr. Nelly Rout; and chart reviewed on 02/26/22.  On evaluation Katherine Franklin states "I'm itching terrible and I'm hungry I need to take a shower and something to eat." Patient states she just moved to [facility] from [facility], and she is homeless. Report she has been on her medications for about 5 years, and she wants to get back on her medications. Patient reporting "After I got checked out in the hospital 2 days ago after I hit that fentanyl thinking it was crack.  It knocked me off my rocker and I wanna get some help." Patient reports she has family. States she has one daughter named Archie Patten that would probably let her move in with her if she was on the right track.    Patient states she has a history of type 2 diabetes, hypertension, Acid reflux, schizoaffective disorder, and PTSD. patient reports she has taken no medications for anything in five years the more.  The tips of my toes are black.  "They said I needed amputation, but the good lord has kept me walk so I told them I didn't need it."  Patient states she has no primary care doctor to go to for her Type 2 DM and Hypertension but needs one.  "That's why I came her.  I need to get set up with y'alls services so I can get on my medications and get help.  I want to go some where for 5 years so I can get off drugs."  Patient states that she is more comfortable being homeless that staying with one of her children "When I'm at home they want to help but I just leave and smoke  crack and end up sleeping on a bench."  States that she is ready to stop using drugs.  Reports it has been years since she has been to rehab.  Patient denies suicidal/self-harm/homicidal ideation, psychosis, and paranoia.  During evaluation Katherine Franklin is sitting up in chair with no noted distress.  She is alert/oriented x 4; calm/cooperative; and mood congruent with affect.  She is speaking in a clear tone at moderate volume, and normal pace; with good eye contact.  Her thought process is coherent and relevant; There is no indication that she is currently responding to internal/external stimuli or experiencing delusional thought content; and she denies suicidal/self-harm/homicidal ideation, psychosis, and paranoia.  She remained calm throughout assessment and has answered questions appropriately.   At this time Katherine Franklin is educated and verbalizes understanding of mental health resources and other crisis services in the community. She is instructed to call 911 and present to the nearest emergency room should she experience any suicidal/homicidal ideation, auditory/visual/hallucinations, or detrimental worsening of her mental health condition.  She was a also advised by Clinical research associate that she could call the toll-free phone on back of  Medicaid card to speak with care coordinator.  She is referred to DSS caseworker for assistance and resources.  Referred to [facility] but was informed she was not eligible related to her insurance.  Psychiatric Specialty Exam  Presentation  General Appearance:Appropriate for Environment; Disheveled  Eye Contact:Good  Speech:Clear and Coherent; Normal Rate  Speech Volume:Normal  Handedness:Right   Mood and Affect  Mood: Anxious  Affect: Congruent   Thought Process  Thought Processes: Coherent; Goal Directed  Descriptions of Associations:Intact  Orientation:Full (Time, Place and Person)  Thought Content:Logical     Hallucinations:None  Ideas of Reference:None  Suicidal Thoughts:No  Homicidal Thoughts:No   Sensorium  Memory: Immediate Good; Recent Good  Judgment: Intact  Insight: Lacking; Present   Executive Functions  Concentration: Good  Attention Span: Good  Recall: Good  Fund of Knowledge: Good  Language: Good   Psychomotor Activity  Psychomotor Activity: Normal   Assets  Assets: Communication Skills; Desire for Improvement; Social Support   Sleep  Sleep: Good  Number of hours: No data recorded  Nutritional Assessment (For OBS and FBC admissions only) Has the patient had a weight loss or gain of 10 pounds or more in the last 3 months?: No Has the patient had a decrease in food intake/or appetite?: No Does the patient have dental problems?: No Does the patient have eating habits or behaviors that may be indicators of an eating disorder including binging or inducing vomiting?: No Has the patient recently lost weight without trying?: 0 Has the patient been eating poorly because of a decreased appetite?: 0 Malnutrition Screening Tool Score: 0   Physical Exam: Physical Exam Vitals and nursing note reviewed. Exam conducted with a chaperone present.  Constitutional:      General: She is not in acute distress.    Appearance: Normal appearance. She is not ill-appearing.  Cardiovascular:     Rate and Rhythm: Normal rate.  Pulmonary:     Effort: Pulmonary effort is normal.  Musculoskeletal:        General: Swelling (Bilateral ankles and feet) present. Normal range of motion.     Cervical back: Normal range of motion.       Feet:  Feet:     Left foot:     Skin integrity: Callus and dry skin present.     Toenail Condition: Left toenails are abnormally thick.     Comments: Dark discoloration of toes, callused skin bilateral.  Reported she was told she would need amputation related to uncontrolled diabetes in past but refused.   Skin:    General: Skin is  warm and dry.  Neurological:     Mental Status: She is alert and oriented to person, place, and time.  Psychiatric:        Attention and Perception: Attention and perception normal. She does not perceive auditory or visual hallucinations.        Mood and Affect: Affect normal. Mood is anxious.        Speech: Speech normal.        Behavior: Behavior normal. Behavior is cooperative.        Thought Content: Thought content normal. Thought content is not paranoid or delusional. Thought content does not include homicidal or suicidal ideation.        Cognition and Memory: Cognition normal.        Judgment: Judgment is impulsive.    Review of Systems  Constitutional: Negative.   HENT: Negative.    Eyes: Negative.   Respiratory: Negative.    Cardiovascular: Negative.        History of HTN  Gastrointestinal: Negative.   Genitourinary: Negative.   Musculoskeletal: Negative.   Skin:  Positive for itching.  Neurological:  Complaints of neuropathy bilateral feet.  Reports related to Type 2 DM  Endo/Heme/Allergies:        Type 2 DM; noncompliant with medicatons  Psychiatric/Behavioral:  Positive for substance abuse (States she uses cocaine everyday and night). Hallucinations: Denies. Suicidal ideas: Denies.The patient is nervous/anxious. The patient does not have insomnia.        Requesting services for rehab and outpatient psychiatric services.  States she also needs a PCP   Blood pressure (!) 144/77, pulse 92, temperature 98.7 F (37.1 C), temperature source Oral, resp. rate 18, SpO2 100 %. There is no height or weight on file to calculate BMI.  Musculoskeletal: Strength & Muscle Tone: within normal limits Gait & Station: normal Patient leans: N/A   BHUC MSE Discharge Disposition for Follow up and Recommendations: Based on my evaluation the patient does not appear to have an emergency medical condition and can be discharged with resources and follow up care in outpatient services  for Medication Management, Substance Abuse Intensive Outpatient Program, and Individual Therapy    Discharge Instructions      [facility] Address: [address] [place], [place], [place] [address] Phone: [phone]  Supported Employment The supported employment program is a person-centered, individualized, evidence-based support service that helps members choose, acquire, and maintain competitive employment in our community. This service supports the varying needs of individuals and promotes community inclusion and employment success. Members enrolled in the supported employment program can expect the following:  Development of an individual career plan Community based job placement Job shadowing Job development On-site job Furniture conservator/restorer and support  Supported Education Supported education helps our members receive the education and training they need to achieve their learning and recovery goals. This will assist members with becoming gainfully employed in the job or career of their choice. The program includes assistance with: Registering for disability accommodations Enrolling in school and registering for classes Learning communication skills Scheduling tutoring sessions within your school [facility] partners with Vocational Rehabilitation to help increase the success of clients seeking employment and educational goals.  Want to learn more about our programs?   Please contact our intake department INTAKE: [phone] Ext [phone]  Mailing: [address] [address]   [place], [place] [address]   [email or web address]    Interactive Resource Center  Hours Monday - Friday: Services: 8:00AM - 3:00PM Offices: 8:00AM - 5:00PM  Physical Address [address] [place], [place] [address]   Please use this address for [facility] Mailing Address [address] [address] [place], [place] [address]  The [facility] helps people reconnect This is a safe place to rest, take care of basic needs and  access the services and community that make all the difference. Our guests come to the [facility] to take a class, do laundry, meet with a case manager or to get their mail. Sometimes they just need to sit in our dayroom and enjoy a conversation.  Here you will find everything from shower facilities to a computer lab, a mail room, classrooms and meeting spaces.  The IRC helps people reconnect with their own lives and with the community at large.  A caring community setting One of the most exciting aspects of the IRC is that so many individuals and organizations in the community are a part of the everyday experience. Whether it's a hair stylist or law firm offering services right in-house, our partners make the [facility] a truly interactive resource center where services are brought to our guests. The [facility] brings together a comprehensive  community of talented people who not only want to help solve problems, but also to be a part of our guests' lives.  Integrated Care We take a person-centered approach to assistance that includes: Case management Geneticist, molecular Medical clinic Mental health nurse Referrals  Fundamental Services We start with necessities: Midwife and Armed forces operational officer addresses and mailboxes Replacement IDs Onsite barbershop Storage lockers [employer] Flag winter warming center  Self-Sufficiency We connect our guests with: Skilled trade classes Job skills classes Resume and jobs application assistance Interview training GED Producer, television/film/video literacy    [employer]: Outpatient psychiatric Services  New Patient Assessment and Therapy Walk-in Monday thru Thursday 8:00 am first come first serve until slots are full Every Friday from 1:00 pm to 4:00 pm first come first serve until slots are full  New Patient Psychiatric Medication Management Monday thru Friday from 8:00 am to 11:00 am first  come first served until slots are full  For all walk-ins we ask that you arrive by 7:15 am because patients will be seen in there order of arrival.   Availability is limited, and therefore you may not be seen on the same day that you walk in.  Our goal is to serve and meet the needs of our community to the best of our ability.      Substance Abuse Treatment Programs  Intensive Outpatient Programs [facility]     [address]      [place], [place]                   [phone]       [facility] [address] [place] [address] [place], [place] [phone]  [name]     (Inpatient and outpatient)     [address]           [phone]    [facility] [phone] (Suboxone and Methadone)  [address]      [place], [place] [address]      [phone]       [address] [place], [place] [phone]  [facility] (Outpatient/Inpatient, Chemical)    (insurance only) [phone]             [facility] (Groups & Residential) [place], [place] [phone]     [facility]     [address]     [place], [place]      [phone]       [facility] (for caregivers and family) [phone] [address] Laurell [address] [place], [place] [phone]      Residential Treatment Programs [facility]      [address], [place], [place] [address]  [phone]       [facility] [address], [place], [place] [address] [phone]  [facility] [place]        [phone]       [facility] [phone]  [facility] ([facility]             [address]                                         [place], [place]  [phone] or [phone]                               [facility] [place] [address] [place], [address] [phone]  [facility]    [address]      [place], [place]     [phone]       [facility] [address] [place], [place] [phone]  [facility]   [address]     [place], [place] [address]     [phone]      Admissions: 8am-3pm M-F  [facility] Services (RTS) [address] [place], [place] [phone]  [facility]: Residential Program [phone](90 Days)   [place], [place]      [phone] or [phone]     [facility]C: [facility] [place], [place] (Walk in Hours over the weekend or by referral)  [facility] [facility] [address] [place], [place], [place] [address] [phone]  Crisis Mobile: [facility]:  [phone] (for crisis response 24 hours a day) [facility] Hotline:      [phone] Outpatient Psychiatry and Counseling  [facility]: Mobile Crisis Management 24 hours:  [phone]  [facility] sliding scale fee and walk in schedule: M-F 8am-12pm/1pm-3pm [address]  [place], [place] [address] [phone]  [facility] [address] [place], [place] [address] [phone]  [facility] ([facility])- new patient walk-in appointments available Monday - Friday 8am -3pm.          [address] [place], [place] [address] [phone] or crisis line- [phone]  [facility]/ Intensive Outpatient Therapy Program [address] [place], [place] [address] [phone]  [facility]                  Crisis Services      [phone]      [address]     [place], [place] [address]                 [facility]   [facility] [phone] [address] [place], [place] [address]   [facility]          [address] [place],  [place], [place] [address]       [phone]  [facility] [address] [place], [place] [address] [phone]  [facility]    [address]    [place], [place] [address]     [phone]       Andee PolesParish McKinney, MD     [address]     [place] [address]     [phone]       [facility] [address] [place] [address]  [facility]     [address]     [place], [place]      [phone]       [facility] Eulogio DitchShamsher Ahluwalia, MD [address] [place], [place] [address] [phone]  [facility]     [address]     [place], [place] [address]     [phone]       [facility] [address] [place], [place] [address] [phone] Resources for  Temporary Residential Assistance/Crisis [facility]) M-F 8am-3pm   [address] [place], [place] [address]   [phone] Services include: laundry, barbering, support groups, case management, phone  & computer access, showers, AA/NA mtgs, mental health/substance abuse nurse, job skills class, disability information, VA assistance, spiritual classes, etc.   [facility]   [address] [place]     [place] (women and children)       [place]. [place], [place] [address] [phone] [email or web address] for application and process Application Required  [facility]   [address]    [place] [address]     [phone]                    [place], [place] [address] [phone]([email or web address] application appt.) Application Required  [facility] (women only)    [address]     [place], [place] [address]     [phone]      Intake starts 6pm daily Need valid ID, SSC, & Police  report [employer] [address] [place], [place] [phone] Application Required  [employer] (men only)     [place].      [place], [place]       [place] (Pregnant women only) [address] [place], [place]  [facility]      [place] [address]      [place], [place] [address]     [phone]             [facility] [address] [place], [place] 90 day commitment/SA/Application process  [facility](men only)     [address]     [place], [place]       Check-in at [facility] [address] [place], [place] [address] [phone] Men/Women/Women and Children must be there by 7 pm  [place], [place]                        Follow-up Information     Services, [facility].   Contact information: [place]ord [place] [address] [phone]         [place].   Why: Call to schedule an appointment for management of hypertension, and Type 2 diabetes Contact information: [place] [address] [place] Laria Grimmett, NP 02/26/2022, 9:53 AM

## 2022-02-26 NOTE — ED Notes (Signed)
Patient A&O x 4, ambulatory. Patient discharged in no acute distress. Patient denied SI/HI, A/VH upon discharge. Patient verbalized understanding of all discharge instructions explained by staff, to include follow up appointments, and safety plan.   Pt belongings returned to patient from locker intact. Patient escorted to lobby via staff for transport to destination. Safety maintained.

## 2022-03-01 ENCOUNTER — Emergency Department (HOSPITAL_COMMUNITY)
Admission: EM | Admit: 2022-03-01 | Discharge: 2022-03-02 | Disposition: A | Discharge status: Home / Self Care | Payer: Medicaid Other | Attending: Physician Assistant | Admitting: Physician Assistant

## 2022-03-01 DIAGNOSIS — Z7984 Long term (current) use of oral hypoglycemic drugs: Secondary | ICD-10-CM | POA: Diagnosis not present

## 2022-03-01 DIAGNOSIS — Z91040 Latex allergy status: Secondary | ICD-10-CM | POA: Insufficient documentation

## 2022-03-01 DIAGNOSIS — Z131 Encounter for screening for diabetes mellitus: Secondary | ICD-10-CM | POA: Insufficient documentation

## 2022-03-01 DIAGNOSIS — E119 Type 2 diabetes mellitus without complications: Secondary | ICD-10-CM | POA: Diagnosis not present

## 2022-03-01 DIAGNOSIS — I10 Essential (primary) hypertension: Secondary | ICD-10-CM | POA: Diagnosis not present

## 2022-03-01 DIAGNOSIS — Z79899 Other long term (current) drug therapy: Secondary | ICD-10-CM | POA: Diagnosis not present

## 2022-03-01 DIAGNOSIS — R7309 Other abnormal glucose: Secondary | ICD-10-CM

## 2022-03-02 ENCOUNTER — Encounter (HOSPITAL_COMMUNITY): Payer: Self-pay | Admitting: Emergency Medicine

## 2022-03-02 ENCOUNTER — Other Ambulatory Visit: Payer: Self-pay

## 2022-03-02 LAB — CBG MONITORING, ED: Glucose-Capillary: 294 mg/dL — ABNORMAL HIGH (ref 70–99)

## 2022-03-02 NOTE — ED Provider Notes (Signed)
[facility] EMERGENCY DEPARTMENT Provider Note   CSN: [id number] Arrival date & time: 03/01/22  2347     History  Chief Complaint  Patient presents with   Blood Sugar Check    Katherine Franklin is a 56 y.o. female.  HPI 56 year old female with a history of DM type II, drug abuse, schizoaffective disorder, GERD, hypertension presents to the ER requesting a CBG check.  She states that she has been homeless and not been taking her medicines for 5 years and needs her sugars checked.  She states she will come get her sugars evaluated when she feels "funny".  Denies any nausea, vomiting, abdominal pain, weakness.  She states that she is in the process of getting disability and getting her medications sorted out.    Home Medications Prior to Admission medications   Medication Sig Start Date End Date Taking? Authorizing Provider  lisinopril (ZESTRIL) 20 MG tablet Take 1 tablet (20 mg total) by mouth daily. 01/29/22   Molpus, John, MD  metFORMIN (GLUCOPHAGE-XR) 500 MG 24 hr tablet Take 4 tablets (2,000 mg total) by mouth every morning. 01/29/22 02/28/22  Molpus, Jenny Reichmann, MD  ondansetron (ZOFRAN) 8 MG tablet Take 1 tablet (8 mg total) by mouth every 8 (eight) hours as needed for nausea or vomiting. 01/29/22   Molpus, John, MD  pantoprazole (PROTONIX) 40 MG tablet Take 1 tablet (40 mg total) by mouth daily. 01/29/22   Molpus, John, MD      Allergies    Sulfa antibiotics and Latex    Review of Systems   Review of Systems Ten systems reviewed and are negative for acute change, except as noted in the HPI.   Physical Exam Updated Vital Signs BP (!) 145/83 (BP Location: Right Arm)   Pulse 92   Temp 99.5 F (37.5 C) (Oral)   Resp 18   SpO2 99%  Physical Exam Vitals and nursing note reviewed.  Constitutional:      General: She is not in acute distress.    Appearance: She is well-developed.  HENT:     Head: Normocephalic and atraumatic.  Eyes:     Conjunctiva/sclera:  Conjunctivae normal.  Cardiovascular:     Rate and Rhythm: Normal rate and regular rhythm.     Heart sounds: No murmur heard. Pulmonary:     Effort: Pulmonary effort is normal. No respiratory distress.     Breath sounds: Normal breath sounds.  Abdominal:     Palpations: Abdomen is soft.     Tenderness: There is no abdominal tenderness.  Musculoskeletal:        General: No swelling.     Cervical back: Neck supple.  Skin:    General: Skin is warm and dry.     Capillary Refill: Capillary refill takes less than 2 seconds.  Neurological:     Mental Status: She is alert.  Psychiatric:        Mood and Affect: Mood normal.     ED Results / Procedures / Treatments   Labs (all labs ordered are listed, but only abnormal results are displayed) Labs Reviewed  CBG MONITORING, ED - Abnormal; Notable for the following components:      Result Value   Glucose-Capillary 294 (*)    All other components within normal limits    EKG None  Radiology No results found.  Procedures Procedures    Medications Ordered in ED Medications - No data to display  ED Course/ Medical Decision Making/ A&P  Medical Decision Making  Patient's CBG checked which was 296.  Before I was able to discuss any more of the patient's care, she walked out of the ER, stated that she was going to obtain medication from [facility] drawn today.  Denied any symptoms of DKA Final Clinical Impression(s) / ED Diagnoses Final diagnoses:  Blood glucose problem    Rx / DC Orders ED Discharge Orders     None         Leone Brand 03/02/22 0007    Tilden Fossa, MD 03/02/22 [id number]

## 2022-03-02 NOTE — ED Triage Notes (Signed)
Patient requesting blood sugar check.

## 2022-03-04 ENCOUNTER — Encounter (HOSPITAL_COMMUNITY): Payer: Self-pay | Admitting: Emergency Medicine

## 2022-03-04 ENCOUNTER — Other Ambulatory Visit: Payer: Self-pay

## 2022-03-04 ENCOUNTER — Emergency Department (HOSPITAL_COMMUNITY)
Admission: EM | Admit: 2022-03-04 | Discharge: 2022-03-04 | Discharge status: Against Medical Advice | Payer: Medicaid Other | Attending: Emergency Medicine | Admitting: Emergency Medicine

## 2022-03-04 DIAGNOSIS — Z59819 Housing instability, housed unspecified: Secondary | ICD-10-CM

## 2022-03-04 DIAGNOSIS — E119 Type 2 diabetes mellitus without complications: Secondary | ICD-10-CM | POA: Insufficient documentation

## 2022-03-04 DIAGNOSIS — F1994 Other psychoactive substance use, unspecified with psychoactive substance-induced mood disorder: Secondary | ICD-10-CM

## 2022-03-04 DIAGNOSIS — R451 Restlessness and agitation: Secondary | ICD-10-CM | POA: Insufficient documentation

## 2022-03-04 DIAGNOSIS — F1990 Other psychoactive substance use, unspecified, uncomplicated: Secondary | ICD-10-CM

## 2022-03-04 DIAGNOSIS — F1410 Cocaine abuse, uncomplicated: Secondary | ICD-10-CM

## 2022-03-04 DIAGNOSIS — I10 Essential (primary) hypertension: Secondary | ICD-10-CM

## 2022-03-04 DIAGNOSIS — F419 Anxiety disorder, unspecified: Secondary | ICD-10-CM | POA: Insufficient documentation

## 2022-03-04 DIAGNOSIS — Z7984 Long term (current) use of oral hypoglycemic drugs: Secondary | ICD-10-CM | POA: Insufficient documentation

## 2022-03-04 DIAGNOSIS — Z91040 Latex allergy status: Secondary | ICD-10-CM | POA: Insufficient documentation

## 2022-03-04 DIAGNOSIS — R739 Hyperglycemia, unspecified: Secondary | ICD-10-CM

## 2022-03-04 LAB — CBC
HCT: 38.9 % (ref 36.0–46.0)
Hemoglobin: 12.4 g/dL (ref 12.0–15.0)
MCH: 27 pg (ref 26.0–34.0)
MCHC: 31.9 g/dL (ref 30.0–36.0)
MCV: 84.7 fL (ref 80.0–100.0)
Platelets: 348 10*3/uL (ref 150–400)
RBC: 4.59 MIL/uL (ref 3.87–5.11)
RDW: 13 % (ref 11.5–15.5)
WBC: 5.1 10*3/uL (ref 4.0–10.5)
nRBC: 0 % (ref 0.0–0.2)

## 2022-03-04 LAB — RAPID URINE DRUG SCREEN, HOSP PERFORMED
Amphetamines: NOT DETECTED
Barbiturates: NOT DETECTED
Benzodiazepines: NOT DETECTED
Cocaine: POSITIVE — AB
Opiates: NOT DETECTED
Tetrahydrocannabinol: NOT DETECTED

## 2022-03-04 LAB — COMPREHENSIVE METABOLIC PANEL
ALT: 20 U/L (ref 0–44)
AST: 18 U/L (ref 15–41)
Albumin: 3.8 g/dL (ref 3.5–5.0)
Alkaline Phosphatase: 108 U/L (ref 38–126)
Anion gap: 12 (ref 5–15)
BUN: 26 mg/dL — ABNORMAL HIGH (ref 6–20)
CO2: 24 mmol/L (ref 22–32)
Calcium: 9.8 mg/dL (ref 8.9–10.3)
Chloride: 98 mmol/L (ref 98–111)
Creatinine, Ser: 0.93 mg/dL (ref 0.44–1.00)
GFR, Estimated: >60 mL/min (ref >60)
Glucose, Bld: 435 mg/dL — ABNORMAL HIGH (ref 70–99)
Potassium: 4 mmol/L (ref 3.5–5.1)
Sodium: 134 mmol/L — ABNORMAL LOW (ref 135–145)
Total Bilirubin: 0.3 mg/dL (ref 0.3–1.2)
Total Protein: 7.6 g/dL (ref 6.5–8.1)

## 2022-03-04 LAB — ETHANOL: Alcohol, Ethyl (B): <10 mg/dL (ref <10)

## 2022-03-04 MED ORDER — HYDROXYZINE HCL 25 MG PO TABS: 25.0000 mg | ORAL_TABLET | Freq: Once | ORAL | Status: DC

## 2022-03-04 MED ORDER — INSULIN ASPART 100 UNIT/ML IJ SOLN: 10.0000 [IU] | Freq: Once | INTRAMUSCULAR | Status: DC

## 2022-03-04 MED ORDER — SODIUM CHLORIDE 0.9 % IV BOLUS: 1000.0000 mL | Freq: Once | INTRAVENOUS | Status: DC

## 2022-03-04 NOTE — ED Provider Notes (Signed)
[facility] EMERGENCY DEPARTMENT Provider Note   CSN: [id number] Arrival date & time: 03/04/22  4580     History  Chief Complaint  Patient presents with   substance use disorder/hx mental health issues    Katherine Franklin is a 56 y.o. female.  Pt with hx schizoaffective disorder, DM, substance use disorder, presents with episode of bizarre, agitated behavior after using substances  - was reportedly disrobing, yelling, ?hallucinating. Pt currently calm, talkative. Pt denies new/acute physical c/o. No headache. No cp or abd pain. No nv. No fever or chills. Not compliant w meds. Indicates using drugs but cant quantify amount or specific substance. Denies daily/heavy etoh use. No thoughts of harm to self or others.   The history is provided by the patient and medical records. The history is limited by the condition of the patient.       Home Medications Prior to Admission medications   Medication Sig Start Date End Date Taking? Authorizing Provider  lisinopril (ZESTRIL) 20 MG tablet Take 1 tablet (20 mg total) by mouth daily. 01/29/22   Molpus, John, MD  metFORMIN (GLUCOPHAGE-XR) 500 MG 24 hr tablet Take 4 tablets (2,000 mg total) by mouth every morning. 01/29/22 02/28/22  Molpus, Jonny Ruiz, MD  ondansetron (ZOFRAN) 8 MG tablet Take 1 tablet (8 mg total) by mouth every 8 (eight) hours as needed for nausea or vomiting. 01/29/22   Molpus, John, MD  pantoprazole (PROTONIX) 40 MG tablet Take 1 tablet (40 mg total) by mouth daily. 01/29/22   Molpus, John, MD      Allergies    Sulfa antibiotics and Latex    Review of Systems   Review of Systems  Constitutional:  Negative for fever.  Respiratory:  Negative for shortness of breath.   Cardiovascular:  Negative for chest pain.  Gastrointestinal:  Negative for abdominal pain.  Musculoskeletal:  Negative for neck pain.  Neurological:  Negative for headaches.  Psychiatric/Behavioral:  Positive for agitation.     Physical  Exam Updated Vital Signs BP (!) 172/113 (BP Location: Right Arm)   Pulse (!) 102   Temp 98.2 F (36.8 C) (Oral)   Resp (!) 28   SpO2 98%  Physical Exam Vitals and nursing note reviewed.  Constitutional:      Appearance: Normal appearance. She is well-developed.  HENT:     Head: Atraumatic.     Nose: Nose normal.     Mouth/Throat:     Mouth: Mucous membranes are moist.  Eyes:     General: No scleral icterus.    Conjunctiva/sclera: Conjunctivae normal.     Pupils: Pupils are equal, round, and reactive to light.  Neck:     Trachea: No tracheal deviation.  Cardiovascular:     Rate and Rhythm: Normal rate and regular rhythm.     Pulses: Normal pulses.     Heart sounds: Normal heart sounds. No murmur heard.    No friction rub. No gallop.  Pulmonary:     Effort: Pulmonary effort is normal. No respiratory distress.     Breath sounds: Normal breath sounds.  Abdominal:     General: Bowel sounds are normal. There is no distension.     Palpations: Abdomen is soft.     Tenderness: There is no abdominal tenderness.  Genitourinary:    Comments: No cva tenderness.  Musculoskeletal:        General: No swelling or tenderness.     Cervical back: Normal range of motion and neck supple. No rigidity.  No muscular tenderness.     Right lower leg: No edema.     Left lower leg: No edema.     Comments: CTLS spine, non tender, aligned, no step off. No focal bony tenderness on extremity exam.   Skin:    General: Skin is warm and dry.     Findings: No rash.  Neurological:     Mental Status: She is alert.     Comments: Alert, speech normal.   Psychiatric:     Comments: Anxious appearing - pt requesting med for 'nerves'. Denies thoughts of harm to self or others. Does not actively appear to be responding to internal stimuli. No hallucinations noted.      ED Results / Procedures / Treatments   Labs (all labs ordered are listed, but only abnormal results are displayed) Results for orders  placed or performed during the hospital encounter of 03/04/22  Comprehensive metabolic panel  Result Value Ref Range   Sodium 134 (L) 135 - 145 mmol/L   Potassium 4.0 3.5 - 5.1 mmol/L   Chloride 98 98 - 111 mmol/L   CO2 24 22 - 32 mmol/L   Glucose, Bld 435 (H) 70 - 99 mg/dL   BUN 26 (H) 6 - 20 mg/dL   Creatinine, Ser 1.02 0.44 - 1.00 mg/dL   Calcium 9.8 8.9 - 58.5 mg/dL   Total Protein 7.6 6.5 - 8.1 g/dL   Albumin 3.8 3.5 - 5.0 g/dL   AST 18 15 - 41 U/L   ALT 20 0 - 44 U/L   Alkaline Phosphatase 108 38 - 126 U/L   Total Bilirubin 0.3 0.3 - 1.2 mg/dL   GFR, Estimated >27 >78 mL/min   Anion gap 12 5 - 15  Ethanol  Result Value Ref Range   Alcohol, Ethyl (B) <10 <10 mg/dL  cbc  Result Value Ref Range   WBC 5.1 4.0 - 10.5 K/uL   RBC 4.59 3.87 - 5.11 MIL/uL   Hemoglobin 12.4 12.0 - 15.0 g/dL   HCT 24.2 35.3 - 61.4 %   MCV 84.7 80.0 - 100.0 fL   MCH 27.0 26.0 - 34.0 pg   MCHC 31.9 30.0 - 36.0 g/dL   RDW 43.1 54.0 - 08.6 %   Platelets 348 150 - 400 K/uL   nRBC 0.0 0.0 - 0.2 %  Rapid urine drug screen (hospital performed)  Result Value Ref Range   Opiates NONE DETECTED NONE DETECTED   Cocaine POSITIVE (A) NONE DETECTED   Benzodiazepines NONE DETECTED NONE DETECTED   Amphetamines NONE DETECTED NONE DETECTED   Tetrahydrocannabinol NONE DETECTED NONE DETECTED   Barbiturates NONE DETECTED NONE DETECTED   DG Chest Portable 1 View  Result Date: 02/24/2022 CLINICAL DATA:  chest pain, cocaine EXAM: PORTABLE CHEST 1 VIEW COMPARISON:  04/19/2019 FINDINGS: Heart and mediastinal contours are within normal limits. No focal opacities or effusions. No acute bony abnormality. IMPRESSION: No active disease. Electronically Signed   By: Charlett Nose M.D.   On: 02/24/2022 20:20     EKG None    Procedures Procedures    Medications Ordered in ED Medications  sodium chloride 0.9 % bolus 1,000 mL (has no administration in time range)  insulin aspart (novoLOG) injection 10 Units (has no  administration in time range)    ED Course/ Medical Decision Making/ A&P                           Medical Decision Making  Problems Addressed: Anxiety: acute illness or injury Cocaine use disorder [facility]): acute illness or injury that poses a threat to life or bodily functions    Details: Acute on chronic Essential hypertension: chronic illness or injury with exacerbation, progression, or side effects of treatment that poses a threat to life or bodily functions Housing instability: chronic illness or injury that poses a threat to life or bodily functions Hyperglycemia: acute illness or injury    Details: Acute on chronic Substance induced mood disorder ([place]): acute illness or injury Substance use disorder: chronic illness or injury  Amount and/or Complexity of Data Reviewed Independent Historian: EMS    Details: hx External Data Reviewed: notes. Labs: ordered. Decision-making details documented in ED Course. Radiology: independent interpretation performed. Decision-making details documented in ED Course.  Risk Prescription drug management. Decision regarding hospitalization.   Iv ns. Continuous pulse ox and cardiac monitoring. Labs ordered/sent.   Diff dx includes sud/sud w mood disorder, aki, dehydration  Reviewed nursing notes and prior charts for additional history. External reports reviewed. Pt appears to have several similar presentations in [facility] system. Additional history from: EMS.   Cardiac monitor: sinus rhythm, rate 90.  Labs reviewed/interpreted by me - glucose high. Hco3 normal. Hx dm and non compliance. Iv ns bolus. Novolog sq. Po fluids/food.   Recent cxr reviewed/interpreted by me - no pna.   Vistaril po.   Recheck pt calm, cooperative, no distress.   Bp improved from prior. No current c/o.  1330, went to reassess pt again to repeat cbg and re-eval for possible d/c - pt had left ED prior to completion eval/tx without notifying  me/staff.           Final Clinical Impression(s) / ED Diagnoses Final diagnoses:  None    Rx / DC Orders ED Discharge Orders     None         Lajean Saver, MD 03/04/22 1343

## 2022-03-04 NOTE — Discharge Instructions (Addendum)
It was our pleasure to provide your ER care today - we hope that you feel better.  Avoid drug use as it is harmful to your physical health and mental well-being. See resource guide attached in terms of accessing inpatient or outpatient substance use treatment programs.  Also see additional shelter and social service resources attached.   Follow up closely with primary care doctor and behavioral health provider in the coming week.  Your blood sugar (and blood pressure) is high today - follow diabetes eating/meal plan, drink plenty of water/stay hydrated, continue diabetes meds, monitor blood sugars, and follow up with primary care doctor in the next 2-3 days.   For mental health issues and/or crisis, you may also go directly to the [facility] - they are open 24/7 and walk-ins are welcome.    Return to ER if worse, new symptoms, fevers, chest pain, trouble breathing, or other emergency concern.

## 2022-03-04 NOTE — ED Triage Notes (Signed)
Patient arrived with EMS from street found naked [place] with auditory/visual hallucinations , erratic behavior/paranoia and flight of ideas this morning .

## 2022-03-05 ENCOUNTER — Emergency Department (HOSPITAL_COMMUNITY)
Admission: EM | Admit: 2022-03-05 | Discharge: 2022-03-05 | Discharge status: Against Medical Advice | Payer: Medicaid Other | Attending: Emergency Medicine | Admitting: Emergency Medicine

## 2022-03-05 DIAGNOSIS — E119 Type 2 diabetes mellitus without complications: Secondary | ICD-10-CM | POA: Insufficient documentation

## 2022-03-05 DIAGNOSIS — Z79899 Other long term (current) drug therapy: Secondary | ICD-10-CM | POA: Insufficient documentation

## 2022-03-05 DIAGNOSIS — I10 Essential (primary) hypertension: Secondary | ICD-10-CM | POA: Diagnosis not present

## 2022-03-05 DIAGNOSIS — Z7984 Long term (current) use of oral hypoglycemic drugs: Secondary | ICD-10-CM | POA: Diagnosis not present

## 2022-03-05 DIAGNOSIS — Z91040 Latex allergy status: Secondary | ICD-10-CM | POA: Insufficient documentation

## 2022-03-05 DIAGNOSIS — R451 Restlessness and agitation: Secondary | ICD-10-CM | POA: Insufficient documentation

## 2022-03-05 DIAGNOSIS — Z5321 Procedure and treatment not carried out due to patient leaving prior to being seen by health care provider: Secondary | ICD-10-CM

## 2022-03-05 NOTE — ED Notes (Signed)
Pt states, "No one could help me. I was here yesterday and so many times and SW did not do anything to help me." Pt is homeless and refuses treatment at this time. Provider has talked to pt offering insulin as pt CBG was elevated. Pt refused. Pt states, "I don't want any medication. I am not suicidal, homicidal. I just want to leave." Pt started walking out cussing at this time. Cleared by provider to go.

## 2022-03-05 NOTE — ED Notes (Signed)
Unable to do VS at this time as pt is very agitated. Provider at bedside talking to pt with police.

## 2022-03-05 NOTE — ED Triage Notes (Signed)
Seen here yesterday for the same complaints. Picked up by EMS wandering on [address] today naked. Pt is very agitated and bizarre. Constantly talking to self and is not redirectable. Hx of schizoaffective do, substance abuse.

## 2022-03-05 NOTE — ED Provider Notes (Signed)
[facility] EMERGENCY DEPARTMENT Provider Note   CSN: [id number] Arrival date & time: 03/05/22  1004     History  Chief Complaint  Patient presents with   Agitation    Katherine Franklin is a 56 y.o. female with medical history of diabetes, GERD, hypertension, PTSD, schizoaffective schizophrenia, drug abuse.  Patient presents to the ED for evaluation of mental health.  Patient was discharged from this ED 24 hours ago.  The patient reports to the ED with [employer] at side, brought in by [facility].  The patient was found on [facility] walking naked.  Upon arrival to the department, I provided the patient with a hospital gown which she threw onto the floor. Per EMS, the patient CBG was in the 400s however the patient is refusing insulin here in the department.  I approached the patient, asked what her goals of care were today and she replied "ain't no one can help me, I need the police to get me another 5-day apartment".  I then advised that we cannot have the police do this however we could get her in touch with social work.  I offered the patient a TOC consult however she replied "social work cant do nothing for me they just be bop, be bop, be bop".  The patient began to yell obscenities in the hallway.  I again tried to redirect the patient and ask her if I would be allowed to give her insulin due to her hyperglycemia.  The patient refused insulin.  I then confirmed that the patient was refusing insulin and she replied "what I cannot refuse insulin?  I am a grown ass woman".  I did advise the patient that it was her right to refuse insulin.  I asked what we can do for the patient today to make her more comfortable and she was unable to answer this question.  The patient refused vital signs by nursing staff.  The patient then went along tirade about how she has been homeless for a number of years now when she is sick of it.  The patient was able to deny any SI, HI, AVH.  HPI     Home  Medications Prior to Admission medications   Medication Sig Start Date End Date Taking? Authorizing Provider  lisinopril (ZESTRIL) 20 MG tablet Take 1 tablet (20 mg total) by mouth daily. 01/29/22   Molpus, John, MD  metFORMIN (GLUCOPHAGE-XR) 500 MG 24 hr tablet Take 4 tablets (2,000 mg total) by mouth every morning. 01/29/22 02/28/22  Molpus, Jonny Ruiz, MD  ondansetron (ZOFRAN) 8 MG tablet Take 1 tablet (8 mg total) by mouth every 8 (eight) hours as needed for nausea or vomiting. 01/29/22   Molpus, John, MD  pantoprazole (PROTONIX) 40 MG tablet Take 1 tablet (40 mg total) by mouth daily. 01/29/22   Molpus, John, MD      Allergies    Sulfa antibiotics and Latex    Review of Systems   Review of Systems  Psychiatric/Behavioral:  Positive for agitation. Negative for hallucinations, self-injury and suicidal ideas.   All other systems reviewed and are negative.   Physical Exam Updated Vital Signs There were no vitals taken for this visit. Physical Exam Vitals and nursing note reviewed.  Constitutional:      General: She is not in acute distress.    Appearance: She is well-developed.  HENT:     Head: Normocephalic and atraumatic.  Eyes:     Conjunctiva/sclera: Conjunctivae normal.  Cardiovascular:  Rate and Rhythm: Normal rate and regular rhythm.     Heart sounds: No murmur heard. Pulmonary:     Effort: Pulmonary effort is normal. No respiratory distress.     Breath sounds: Normal breath sounds.  Abdominal:     Palpations: Abdomen is soft.     Tenderness: There is no abdominal tenderness.  Musculoskeletal:        General: No swelling.     Cervical back: Neck supple.  Skin:    General: Skin is warm and dry.     Capillary Refill: Capillary refill takes less than 2 seconds.  Neurological:     Mental Status: She is alert.  Psychiatric:        Mood and Affect: Affect is angry.        Speech: Speech normal.        Behavior: Behavior is agitated and aggressive.        Thought Content:  Thought content does not include homicidal or suicidal ideation. Thought content does not include homicidal or suicidal plan.     ED Results / Procedures / Treatments   Labs (all labs ordered are listed, but only abnormal results are displayed) Labs Reviewed - No data to display  EKG None  Radiology No results found.  Procedures Procedures   Medications Ordered in ED Medications - No data to display  ED Course/ Medical Decision Making/ A&P                           Medical Decision Making  56 year old female presents to ED for evaluation.  Please see HPI for further details.  On arrival the patient is screaming in the hallway yelling slurs and obscenities at staff.  The patient is unable to be redirected or calmed.  The patient was asked what her goals of care were today however she stated that she has no goals of care because "no one can help me".  I asked the patient if she would like a social work consult however the patient refused this stating that social work cannot help her.  I then asked the patient if she would allow [place] to provide her insulin due to her hyperglycemia and she refused this.  The patient is alert and oriented x4.  The patient denies any SI, HI or AVH.  Patient was provided change of clothes and when I returned to the patient bed to continue examination the patient had eloped from the department.  Nursing staff states that they saw the patient ambulating under her own power out of the department.  Patient has eloped.  Final Clinical Impression(s) / ED Diagnoses Final diagnoses:  Eloped from emergency department  Agitation    Rx / DC Orders ED Discharge Orders     None         Lawana Chambers 03/05/22 1055    Isla Pence, MD 03/05/22 1345

## 2022-03-06 ENCOUNTER — Inpatient Hospital Stay (HOSPITAL_COMMUNITY)
Admission: EM | Admit: 2022-03-06 | Discharge: 2022-03-16 | DRG: 300 | Disposition: A | Discharge status: Home / Self Care | Payer: Medicaid Other | Attending: Internal Medicine | Admitting: Internal Medicine

## 2022-03-06 DIAGNOSIS — Z7984 Long term (current) use of oral hypoglycemic drugs: Secondary | ICD-10-CM

## 2022-03-06 DIAGNOSIS — Z1152 Encounter for screening for COVID-19: Secondary | ICD-10-CM

## 2022-03-06 DIAGNOSIS — K219 Gastro-esophageal reflux disease without esophagitis: Secondary | ICD-10-CM | POA: Diagnosis present

## 2022-03-06 DIAGNOSIS — Z91199 Patient's noncompliance with other medical treatment and regimen due to unspecified reason: Secondary | ICD-10-CM

## 2022-03-06 DIAGNOSIS — F17210 Nicotine dependence, cigarettes, uncomplicated: Secondary | ICD-10-CM | POA: Diagnosis present

## 2022-03-06 DIAGNOSIS — E119 Type 2 diabetes mellitus without complications: Secondary | ICD-10-CM

## 2022-03-06 DIAGNOSIS — F259 Schizoaffective disorder, unspecified: Secondary | ICD-10-CM | POA: Diagnosis present

## 2022-03-06 DIAGNOSIS — Z5900 Homelessness unspecified: Secondary | ICD-10-CM

## 2022-03-06 DIAGNOSIS — Z79899 Other long term (current) drug therapy: Secondary | ICD-10-CM

## 2022-03-06 DIAGNOSIS — F4310 Post-traumatic stress disorder, unspecified: Secondary | ICD-10-CM | POA: Diagnosis present

## 2022-03-06 DIAGNOSIS — E1152 Type 2 diabetes mellitus with diabetic peripheral angiopathy with gangrene: Principal | ICD-10-CM | POA: Diagnosis present

## 2022-03-06 DIAGNOSIS — F1994 Other psychoactive substance use, unspecified with psychoactive substance-induced mood disorder: Secondary | ICD-10-CM | POA: Diagnosis present

## 2022-03-06 DIAGNOSIS — Z781 Physical restraint status: Secondary | ICD-10-CM

## 2022-03-06 DIAGNOSIS — F1420 Cocaine dependence, uncomplicated: Secondary | ICD-10-CM | POA: Diagnosis present

## 2022-03-06 DIAGNOSIS — F250 Schizoaffective disorder, bipolar type: Secondary | ICD-10-CM | POA: Diagnosis present

## 2022-03-06 DIAGNOSIS — L089 Local infection of the skin and subcutaneous tissue, unspecified: Secondary | ICD-10-CM

## 2022-03-06 DIAGNOSIS — Z91040 Latex allergy status: Secondary | ICD-10-CM

## 2022-03-06 DIAGNOSIS — F14259 Cocaine dependence with cocaine-induced psychotic disorder, unspecified: Secondary | ICD-10-CM | POA: Diagnosis present

## 2022-03-06 DIAGNOSIS — Z046 Encounter for general psychiatric examination, requested by authority: Principal | ICD-10-CM

## 2022-03-06 DIAGNOSIS — Z882 Allergy status to sulfonamides status: Secondary | ICD-10-CM

## 2022-03-06 DIAGNOSIS — I10 Essential (primary) hypertension: Secondary | ICD-10-CM | POA: Diagnosis present

## 2022-03-06 DIAGNOSIS — E1165 Type 2 diabetes mellitus with hyperglycemia: Secondary | ICD-10-CM | POA: Diagnosis present

## 2022-03-06 DIAGNOSIS — I96 Gangrene, not elsewhere classified: Secondary | ICD-10-CM | POA: Diagnosis present

## 2022-03-06 DIAGNOSIS — F29 Unspecified psychosis not due to a substance or known physiological condition: Secondary | ICD-10-CM

## 2022-03-06 MED ORDER — ZIPRASIDONE MESYLATE 20 MG IM SOLR
20.0000 mg | Freq: Once | INTRAMUSCULAR | Status: AC
  Administered 2022-03-06: 20 mg via INTRAMUSCULAR

## 2022-03-06 NOTE — ED Triage Notes (Signed)
PER EMS: pt was found wandering naked at the [place] eating her own feces. Pt arrives agitated and very aggressive, delusions of persecution. Hx of schizophrenia, PTSD and drug abuse. EMS unable to get vital signs.

## 2022-03-06 NOTE — ED Provider Notes (Signed)
[facility] EMERGENCY DEPARTMENT Provider Note   CSN: [id number] Arrival date & time: 03/06/22  2247     History  Chief Complaint  Patient presents with   Mental Health Problem    Katherine Franklin is a 56 y.o. female.  The history is provided by the patient and medical records.  Mental Health Problem  56 year old female with history of diabetes, schizoaffective disorder, hypertension, polysubstance abuse, PTSD, presenting to the ED via EMS.  EMS was called to local sick go where she was eating her own feces for 10 cents.  She apparently was walking around naked, yelling, agitated, cursing and screaming at people yelling "I know where the bodies are buried". She arrives to triage in T-shirt only, continues yelling and cursing at staff.  Home Medications Prior to Admission medications   Medication Sig Start Date End Date Taking? Authorizing Provider  lisinopril (ZESTRIL) 20 MG tablet Take 1 tablet (20 mg total) by mouth daily. 01/29/22   Molpus, John, MD  metFORMIN (GLUCOPHAGE-XR) 500 MG 24 hr tablet Take 4 tablets (2,000 mg total) by mouth every morning. 01/29/22 02/28/22  Molpus, Jonny Ruiz, MD  ondansetron (ZOFRAN) 8 MG tablet Take 1 tablet (8 mg total) by mouth every 8 (eight) hours as needed for nausea or vomiting. 01/29/22   Molpus, John, MD  pantoprazole (PROTONIX) 40 MG tablet Take 1 tablet (40 mg total) by mouth daily. 01/29/22   Molpus, John, MD      Allergies    Sulfa antibiotics and Latex    Review of Systems   Review of Systems  Unable to perform ROS: Psychiatric disorder    Physical Exam Updated Vital Signs BP 127/77 (BP Location: Left Arm)   Pulse 96   Temp 98.4 F (36.9 C) (Oral)   Resp 16   SpO2 97%   Physical Exam Vitals and nursing note reviewed.  Constitutional:      Appearance: She is well-developed.  HENT:     Head: Normocephalic and atraumatic.  Eyes:     Conjunctiva/sclera: Conjunctivae normal.     Pupils: Pupils are equal, round,  and reactive to light.  Cardiovascular:     Rate and Rhythm: Normal rate and regular rhythm.     Heart sounds: Normal heart sounds.  Pulmonary:     Effort: Pulmonary effort is normal.     Breath sounds: Normal breath sounds.  Abdominal:     General: Bowel sounds are normal.     Palpations: Abdomen is soft.  Musculoskeletal:        General: Normal range of motion.     Cervical back: Normal range of motion.  Skin:    General: Skin is warm and dry.  Neurological:     Mental Status: She is alert and oriented to person, place, and time.  Psychiatric:     Comments: Cursing, yelling at staff and other patients, unwilling to cooperate with exam     ED Results / Procedures / Treatments   Labs (all labs ordered are listed, but only abnormal results are displayed) Labs Reviewed  CBC WITH DIFFERENTIAL/PLATELET - Abnormal; Notable for the following components:      Result Value   Hemoglobin 10.9 (*)    HCT 34.6 (*)    All other components within normal limits  COMPREHENSIVE METABOLIC PANEL - Abnormal; Notable for the following components:   Glucose, Bld 338 (*)    BUN 22 (*)    All other components within normal limits  SALICYLATE LEVEL - Abnormal;  Notable for the following components:   Salicylate Lvl <7.0 (*)    All other components within normal limits  ACETAMINOPHEN LEVEL - Abnormal; Notable for the following components:   Acetaminophen (Tylenol), Serum <10 (*)    All other components within normal limits  RESP PANEL BY RT-PCR (FLU A&B, COVID) ARPGX2  ETHANOL  RAPID URINE DRUG SCREEN, HOSP PERFORMED    EKG None  Radiology No results found.  Procedures Procedures    CRITICAL CARE Performed by: Garlon Hatchet   Total critical care time: 45 minutes  Critical care time was exclusive of separately billable procedures and treating other patients.  Critical care was necessary to treat or prevent imminent or life-threatening deterioration.  Critical care was time  spent personally by me on the following activities: development of treatment plan with patient and/or surrogate as well as nursing, discussions with consultants, evaluation of patient's response to treatment, examination of patient, obtaining history from patient or surrogate, ordering and performing treatments and interventions, ordering and review of laboratory studies, ordering and review of radiographic studies, pulse oximetry and re-evaluation of patient's condition.   Medications Ordered in ED Medications  ziprasidone (GEODON) injection 20 mg (20 mg Intramuscular Given 03/06/22 2308)    ED Course/ Medical Decision Making/ A&P                           Medical Decision Making Amount and/or Complexity of Data Reviewed Labs: ordered. ECG/medicine tests: ordered and independent interpretation performed.  Risk Prescription drug management.   56 y.o. F brought in by EMS after she was found running naked and eating her own feces at a local gas station.  She is yelling, cursing, refusing any kind of care on arrival.  Continues taking off her clothes, now yelling at other patients, etc.  Attempted to redirect several times by staff, security, and [employer] without success.  IVC papers files along with first exam.  She will be given geodon for staff and patient safety.  12:20 AM Patient much more calm at this time after geodon, she is allowing vitals to be taken.  Will attempt blood draw.  Labs as above-- no profound anemia or electrolyte derangement.  Negative APAP, salicylate, and ethanol levels.  UDS pending.  Medically cleared, will get TTS consult.  Final Clinical Impression(s) / ED Diagnoses Final diagnoses:  Involuntary commitment    Rx / DC Orders ED Discharge Orders     None         Garlon Hatchet, PA-C 03/07/22 1610    Gilda Crease, MD 03/07/22 [phone]

## 2022-03-07 ENCOUNTER — Emergency Department (HOSPITAL_COMMUNITY): Payer: Medicaid Other

## 2022-03-07 DIAGNOSIS — Z046 Encounter for general psychiatric examination, requested by authority: Secondary | ICD-10-CM | POA: Diagnosis not present

## 2022-03-07 DIAGNOSIS — F14250 Cocaine dependence with cocaine-induced psychotic disorder with delusions: Secondary | ICD-10-CM

## 2022-03-07 DIAGNOSIS — F250 Schizoaffective disorder, bipolar type: Secondary | ICD-10-CM | POA: Diagnosis not present

## 2022-03-07 DIAGNOSIS — F259 Schizoaffective disorder, unspecified: Secondary | ICD-10-CM | POA: Diagnosis present

## 2022-03-07 DIAGNOSIS — I96 Gangrene, not elsewhere classified: Secondary | ICD-10-CM | POA: Diagnosis not present

## 2022-03-07 DIAGNOSIS — E119 Type 2 diabetes mellitus without complications: Secondary | ICD-10-CM

## 2022-03-07 DIAGNOSIS — F29 Unspecified psychosis not due to a substance or known physiological condition: Secondary | ICD-10-CM

## 2022-03-07 LAB — CBC WITH DIFFERENTIAL/PLATELET
Abs Immature Granulocytes: 0.01 10*3/uL (ref 0.00–0.07)
Basophils Absolute: 0.1 10*3/uL (ref 0.0–0.1)
Basophils Relative: 1 %
Eosinophils Absolute: 0.2 10*3/uL (ref 0.0–0.5)
Eosinophils Relative: 3 %
HCT: 34.6 % — ABNORMAL LOW (ref 36.0–46.0)
Hemoglobin: 10.9 g/dL — ABNORMAL LOW (ref 12.0–15.0)
Immature Granulocytes: 0 %
Lymphocytes Relative: 37 %
Lymphs Abs: 2.3 10*3/uL (ref 0.7–4.0)
MCH: 27.5 pg (ref 26.0–34.0)
MCHC: 31.5 g/dL (ref 30.0–36.0)
MCV: 87.4 fL (ref 80.0–100.0)
Monocytes Absolute: 0.5 10*3/uL (ref 0.1–1.0)
Monocytes Relative: 8 %
Neutro Abs: 3.1 10*3/uL (ref 1.7–7.7)
Neutrophils Relative %: 51 %
Platelets: 288 10*3/uL (ref 150–400)
RBC: 3.96 MIL/uL (ref 3.87–5.11)
RDW: 13.2 % (ref 11.5–15.5)
WBC: 6.1 10*3/uL (ref 4.0–10.5)
nRBC: 0 % (ref 0.0–0.2)

## 2022-03-07 LAB — COMPREHENSIVE METABOLIC PANEL
ALT: 22 U/L (ref 0–44)
AST: 21 U/L (ref 15–41)
Albumin: 3.6 g/dL (ref 3.5–5.0)
Alkaline Phosphatase: 100 U/L (ref 38–126)
Anion gap: 13 (ref 5–15)
BUN: 22 mg/dL — ABNORMAL HIGH (ref 6–20)
CO2: 23 mmol/L (ref 22–32)
Calcium: 9.3 mg/dL (ref 8.9–10.3)
Chloride: 101 mmol/L (ref 98–111)
Creatinine, Ser: 0.98 mg/dL (ref 0.44–1.00)
GFR, Estimated: >60 mL/min (ref >60)
Glucose, Bld: 338 mg/dL — ABNORMAL HIGH (ref 70–99)
Potassium: 3.8 mmol/L (ref 3.5–5.1)
Sodium: 137 mmol/L (ref 135–145)
Total Bilirubin: 0.5 mg/dL (ref 0.3–1.2)
Total Protein: 7.1 g/dL (ref 6.5–8.1)

## 2022-03-07 LAB — RAPID URINE DRUG SCREEN, HOSP PERFORMED
Amphetamines: NOT DETECTED
Barbiturates: NOT DETECTED
Benzodiazepines: NOT DETECTED
Cocaine: POSITIVE — AB
Opiates: NOT DETECTED
Tetrahydrocannabinol: NOT DETECTED

## 2022-03-07 LAB — RESP PANEL BY RT-PCR (FLU A&B, COVID) ARPGX2
Influenza A by PCR: NEGATIVE
Influenza B by PCR: NEGATIVE
SARS Coronavirus 2 by RT PCR: NEGATIVE

## 2022-03-07 LAB — ACETAMINOPHEN LEVEL: Acetaminophen (Tylenol), Serum: <10 ug/mL — ABNORMAL LOW (ref 10–30)

## 2022-03-07 LAB — SALICYLATE LEVEL: Salicylate Lvl: <7 mg/dL — ABNORMAL LOW (ref 7.0–30.0)

## 2022-03-07 LAB — ETHANOL: Alcohol, Ethyl (B): <10 mg/dL (ref <10)

## 2022-03-07 MED ORDER — BENZTROPINE MESYLATE 2 MG PO TABS
2.0000 mg | ORAL_TABLET | Freq: Every day | ORAL | Status: DC
  Filled 2022-03-07 (×3): qty 1

## 2022-03-07 MED ORDER — ZIPRASIDONE MESYLATE 20 MG IM SOLR
20.0000 mg | Freq: Two times a day (BID) | INTRAMUSCULAR | Status: DC | PRN
  Administered 2022-03-07: 20 mg via INTRAMUSCULAR
  Administered 2022-03-08: 10 mg via INTRAMUSCULAR
  Administered 2022-03-09: 20 mg via INTRAMUSCULAR
  Filled 2022-03-07 (×5): qty 20

## 2022-03-07 MED ORDER — ARIPIPRAZOLE 10 MG PO TABS
10.0000 mg | ORAL_TABLET | Freq: Every day | ORAL | Status: DC
  Filled 2022-03-07 (×2): qty 1

## 2022-03-07 MED ORDER — METRONIDAZOLE 500 MG PO TABS
500.0000 mg | ORAL_TABLET | Freq: Once | ORAL | Status: DC
  Filled 2022-03-07: qty 1

## 2022-03-07 MED ORDER — DIPHENHYDRAMINE HCL 50 MG/ML IJ SOLN
INTRAMUSCULAR | Status: AC
  Filled 2022-03-07: qty 1

## 2022-03-07 MED ORDER — LORAZEPAM 2 MG/ML IJ SOLN
2.0000 mg | Freq: Once | INTRAMUSCULAR | Status: AC
  Administered 2022-03-07: 2 mg via INTRAVENOUS

## 2022-03-07 MED ORDER — DIPHENHYDRAMINE HCL 50 MG/ML IJ SOLN
50.0000 mg | Freq: Once | INTRAMUSCULAR | Status: AC
  Administered 2022-03-07: 50 mg via INTRAVENOUS

## 2022-03-07 MED ORDER — LORAZEPAM 1 MG PO TABS
1.0000 mg | ORAL_TABLET | ORAL | Status: DC | PRN
  Filled 2022-03-07: qty 1

## 2022-03-07 MED ORDER — LORAZEPAM 2 MG/ML IJ SOLN
1.0000 mg | Freq: Four times a day (QID) | INTRAMUSCULAR | Status: DC | PRN
  Administered 2022-03-08: 1 mg via INTRAMUSCULAR
  Filled 2022-03-07: qty 1

## 2022-03-07 MED ORDER — SODIUM CHLORIDE 0.9 % IV SOLN
2.0000 g | Freq: Three times a day (TID) | INTRAVENOUS | Status: DC
  Filled 2022-03-07: qty 12.5

## 2022-03-07 MED ORDER — STERILE WATER FOR INJECTION IJ SOLN
INTRAMUSCULAR | Status: AC
  Filled 2022-03-07: qty 10

## 2022-03-07 MED ORDER — VANCOMYCIN HCL 750 MG/150ML IV SOLN
750.0000 mg | Freq: Two times a day (BID) | INTRAVENOUS | Status: DC
  Filled 2022-03-07: qty 150

## 2022-03-07 MED ORDER — VANCOMYCIN HCL 2000 MG/400ML IV SOLN
2000.0000 mg | Freq: Once | INTRAVENOUS | Status: DC
  Filled 2022-03-07: qty 400

## 2022-03-07 MED ORDER — LORAZEPAM 2 MG/ML IJ SOLN
INTRAMUSCULAR | Status: AC
  Filled 2022-03-07: qty 1

## 2022-03-07 NOTE — ED Notes (Signed)
Pt sitting quietly on stretcher

## 2022-03-07 NOTE — ED Notes (Signed)
Psychiatry at bedside.

## 2022-03-07 NOTE — ED Notes (Signed)
Review of patient clipboard IVC documentation reveals IVC'd 03/07/22 with an expiration of 03/14/22. Clipboard moved to [employer].

## 2022-03-07 NOTE — ED Notes (Signed)
Pt loud cursing using extra foul language undressed  not c0-operatvie   not co-operative

## 2022-03-07 NOTE — ED Notes (Signed)
Provider seen patient again at bedside to discuss taking her medications and having MRI completed. Patient initially was pleasant, however, became aggressive quickly when provided explained that she had to remain at the ED until we could find an impatient bed. Patient yelled and screamed that she is not taking any medication, she used multiple expletives, subsequent pushed the tray table into the wall and call this writer a "stupid bitch" for keeping here and she is not homicidal or suicidal.  Security was being summoned to bedside

## 2022-03-07 NOTE — ED Notes (Addendum)
Pt found walking in yellow zone, agitated and yelling about salad dressing; tech brought pt salad dressing, pt now refusing, states "I'll just starve" (pt just given lunch, had snacks and breakfast earlier); pt escorted back to stretcher, security at bedside; pt refusing meds; will continue to monitor as able

## 2022-03-07 NOTE — ED Notes (Signed)
Pt transported to xray 

## 2022-03-07 NOTE — BH Assessment (Addendum)
Per chart, pt is still in the triage and doesn't have a room. There is no nurse assigned to the pt. TTS to assess pt once medically cleared and placed in a private room.     Vertell Novak, [place], [facility], [facility] Triage Specialist [phone]

## 2022-03-07 NOTE — Progress Notes (Signed)
Pharmacy Antibiotic Note  Katherine Franklin is a 56 y.o. female admitted on 03/06/2022 with  concern for gas forming infection on R second toe  .  Pharmacy has been consulted for vancomycin and cefepime dosing.  Plan: Vancomycin 2000mg  IV once, then 750mg  Q12h (eAUC 464.3, Scr 0.98) Cefepime 2g Q8h IV Monitor renal function and cultures for pathogen directed therapy    Temp (24hrs), Avg:98.4 F (36.9 C), Min:98.3 F (36.8 C), Max:98.4 F (36.9 C)  Recent Labs  Lab 03/04/22 0703 03/06/22 2111  WBC 5.1 6.1  CREATININE 0.93 0.98    Estimated Creatinine Clearance: 83.3 mL/min (by C-G formula based on SCr of 0.98 mg/dL).    Allergies  Allergen Reactions   Sulfa Antibiotics Shortness Of Breath, Rash and Swelling   Latex Rash    Antimicrobials this admission: Vancomycin 10/25 >>  Cefepime 10/25 >>  Flagyl 10/25 >>  Dose adjustments this admission: N/a  Microbiology results: 10/25 Resp panel negative  Thank you for allowing pharmacy to be a part of this patient's care.  Titus Dubin, PharmD PGY1 Pharmacy Resident 03/07/2022 11:28 PM

## 2022-03-07 NOTE — ED Notes (Signed)
Lunch tray at bedside. ?

## 2022-03-07 NOTE — ED Notes (Signed)
Pt alert, calm, cooperative at present; pt given coloring sheets and dull pencils; no sitter available at present; CN aware

## 2022-03-07 NOTE — Assessment & Plan Note (Signed)
Cocaine use with substance-induced mood disorder Under IVC Seen by psychiatry who have recommended inpatient psychiatric treatment.  She has been started on Abilify 10 mg daily with Ativan and Geodon prn q12h for agitation.

## 2022-03-07 NOTE — ED Notes (Signed)
Pt asking if they're going to cut off her foot; explained to pt that provider wants to get MRI for further evaluation of feet; pt yelling "I don't want my foot cut off, I don't want the MRI!"; PA Verdis Frederickson and psych team notified notified

## 2022-03-07 NOTE — ED Notes (Signed)
Pt alert, eating breakfast; calm and cooperative at present; pt currently denies si/hi, A/V hallucinations

## 2022-03-07 NOTE — ED Notes (Signed)
Pt resting with eyes closed; respirations spontaneous, even, unlabored 

## 2022-03-07 NOTE — Progress Notes (Signed)
Inpatient Behavioral Health Placement  Pt meets inpatient criteria per Molli Barrows, FNP. There are no available beds at [facility] per [facility] Carolyne Littles, RN.  Referral was sent to the following facilities;   Destination Service Provider Address Phone Fax  [place] [place] [address] [phone]  [facility]  [place], [place] [address] [address]  [facility]  [address] [place] [address] [phone]  [facility]  [place], [place] [address] [phone]  [facility]  [address], [place] [address] [phone]  [place] [place] [address] [phone]  [facility]  [address] [place] [address] [phone] [facility]  [address] [place] [address] [phone]  [facility]  [address] [place] [address] [phone] [facility]  [address] [place] [address] [phone]  [facility]  [address], [place] [address] [phone] [facility]  [address], [place] [address] [phone] [place], [place] [address] [place]  [facility]  [address], [place] [address] [phone]    Situation ongoing,  CSW will follow up.   Benjaman Kindler, MSW, LCSWA 03/07/2022  @ 10:30 AM

## 2022-03-07 NOTE — Progress Notes (Signed)
Patient has been denied by [facility] due to no appropriate beds available. Patient meets [place] inpatient criteria per Molli Barrows, FNP. Patient has been faxed out to the following facilities:    [place] [place] [address] [phone] [facility]  [place], [place] [address] [phone]  [facility]  [address] [place] [address] [phone] [place]  [place], [place] [address] [place]  [facility]  [address], [place] [address] [phone] [place] [place] [address] [phone]  [facility]  [address], [place] [address] [phone] [facility]  [address] [place] [address] [phone]  [facility]  [address], [place] [address] [phone] [facility]  [address] [place] [address] [phone]  [facility]  [address], [place] [address] [phone] [facility]  [address], [place] [address] [phone] [place]  [place], [place] [address] [place]  [facility]  [address], [place] [address] [phone] [place], MSW, LCSW-A  8:57 PM 03/07/2022

## 2022-03-07 NOTE — Progress Notes (Signed)
Per Evlyn Courier, PA-C, patient will be transferred to the medical floor for her foot to be evaluated. [facility] was reviewing patient for potential acceptance.    Mariea Clonts, MSW, LCSW-A  9:22 PM 03/07/2022

## 2022-03-07 NOTE — ED Provider Notes (Addendum)
56 year old female who presented for psych clearance, cleared by prior provider.  I was approached by RN as social work was inquiring about her toes and whether or not they need to be amputated.  On my exam, she does have evidence of necrosis on almost all toes of her feet bilaterally, 2+ DP pulses.  No signs of active infection however x-ray shows questionable gas-forming soft tissue infection of the right second digit.  Plan to obtain MRI of bilateral feet.   Patient refusing MRI of her feet.  She reports that she would not want surgery for amputation even if she needed it.  She appears to be able to make her own decisions.  I relayed this to the social work team.  Per chart review, she has had necrosis of her toes for several months, was seen in the ER back in September with negative imaging.  I do not see any acute signs of infection at this time and she is not septic.  Given this and her refusal for further pursuit of work-up, I believe she is medically stable for psychiatric placement.                 Results for orders placed or performed during the hospital encounter of 03/06/22  Resp Panel by RT-PCR (Flu A&B, Covid) Anterior Nasal Swab   Specimen: Anterior Nasal Swab  Result Value Ref Range   SARS Coronavirus 2 by RT PCR NEGATIVE NEGATIVE   Influenza A by PCR NEGATIVE NEGATIVE   Influenza B by PCR NEGATIVE NEGATIVE  CBC with Differential  Result Value Ref Range   WBC 6.1 4.0 - 10.5 K/uL   RBC 3.96 3.87 - 5.11 MIL/uL   Hemoglobin 10.9 (L) 12.0 - 15.0 g/dL   HCT 34.6 (L) 36.0 - 46.0 %   MCV 87.4 80.0 - 100.0 fL   MCH 27.5 26.0 - 34.0 pg   MCHC 31.5 30.0 - 36.0 g/dL   RDW 13.2 11.5 - 15.5 %   Platelets 288 150 - 400 K/uL   nRBC 0.0 0.0 - 0.2 %   Neutrophils Relative % 51 %   Neutro Abs 3.1 1.7 - 7.7 K/uL   Lymphocytes Relative 37 %   Lymphs Abs 2.3 0.7 - 4.0 K/uL   Monocytes Relative 8 %   Monocytes Absolute 0.5 0.1 - 1.0 K/uL   Eosinophils Relative 3 %    Eosinophils Absolute 0.2 0.0 - 0.5 K/uL   Basophils Relative 1 %   Basophils Absolute 0.1 0.0 - 0.1 K/uL   Immature Granulocytes 0 %   Abs Immature Granulocytes 0.01 0.00 - 0.07 K/uL  Comprehensive metabolic panel  Result Value Ref Range   Sodium 137 135 - 145 mmol/L   Potassium 3.8 3.5 - 5.1 mmol/L   Chloride 101 98 - 111 mmol/L   CO2 23 22 - 32 mmol/L   Glucose, Bld 338 (H) 70 - 99 mg/dL   BUN 22 (H) 6 - 20 mg/dL   Creatinine, Ser 0.98 0.44 - 1.00 mg/dL   Calcium 9.3 8.9 - 10.3 mg/dL   Total Protein 7.1 6.5 - 8.1 g/dL   Albumin 3.6 3.5 - 5.0 g/dL   AST 21 15 - 41 U/L   ALT 22 0 - 44 U/L   Alkaline Phosphatase 100 38 - 126 U/L   Total Bilirubin 0.5 0.3 - 1.2 mg/dL   GFR, Estimated >60 >60 mL/min   Anion gap 13 5 - 15  Ethanol  Result Value Ref Range  Alcohol, Ethyl (B) <10 <10 mg/dL  Rapid urine drug screen (hospital performed)  Result Value Ref Range   Opiates NONE DETECTED NONE DETECTED   Cocaine POSITIVE (A) NONE DETECTED   Benzodiazepines NONE DETECTED NONE DETECTED   Amphetamines NONE DETECTED NONE DETECTED   Tetrahydrocannabinol NONE DETECTED NONE DETECTED   Barbiturates NONE DETECTED NONE DETECTED  Salicylate level  Result Value Ref Range   Salicylate Lvl <7.0 (L) 7.0 - 30.0 mg/dL  Acetaminophen level  Result Value Ref Range   Acetaminophen (Tylenol), Serum <10 (L) 10 - 30 ug/mL   DG Chest Portable 1 View  Result Date: 02/24/2022 CLINICAL DATA:  chest pain, cocaine EXAM: PORTABLE CHEST 1 VIEW COMPARISON:  04/19/2019 FINDINGS: Heart and mediastinal contours are within normal limits. No focal opacities or effusions. No acute bony abnormality. IMPRESSION: No active disease. Electronically Signed   By: Charlett Nose M.D.   On: 02/24/2022 20:20       Mare Ferrari, PA-C 03/07/22 1524        Mare Ferrari, PA-C 03/08/22 1308    Gloris Manchester, MD 03/10/22 2128

## 2022-03-07 NOTE — Consult Note (Signed)
Initial Consultation Note   Patient: Katherine Franklin WER:[id number] DOB: 08-Jan-1966 PCP: Patient, No Pcp Per DOA: 03/06/2022 DOS: the patient was seen and examined on 03/08/2022 Primary service: Ezequiel Essex, MD  Referring physician: Evlyn Courier, PA-C Reason for consult: Dry gangrene right second toe   Assessment and Plan: * Schizoaffective disorder ([place]) Cocaine use with substance-induced mood disorder Under IVC Seen by psychiatry who have recommended inpatient psychiatric treatment.  She has been started on Abilify 10 mg daily with Ativan and Geodon prn q12h for agitation.  Dry gangrene toes of bilateral feet ([place]) This is a chronic issue that has been noted on prior ED visits with similar appearance on previous photos.  She is hemodynamically stable and without leukocytosis.  X-rays showed changes concerning for gas-forming infection at the tip of the right second toe but otherwise no sign of active systemic infection.  Pedal pulses are easily palpable.  Patient refused further work-up with MRI and also stated that she would not want any type of surgical intervention or amputation if felt necessary.  She has been refusing medication and is not adherent to her diabetes or hypertension meds as well.  EDP spoke with podiatry who recommended broad-spectrum antibiotics with vancomycin, cefepime, Flagyl. -This is a chronic issue with limited management options due to patient cooperation and preference -Obtain MRI of feet if patient is willing -She has been started on broad-spectrum IV antibiotics, I think this can likely be de-escalated to oral antibiotics if she is willing to take them and can be managed on an outpatient basis especially if she remains unwilling to proceed with further work-up or surgical intervention  Type 2 diabetes mellitus ([place]) Hyperglycemic without evidence of DKA or HHS.  Not adherent to home meds.  Would place on sliding scale insulin if she is cooperative with CBG  checks and willing to receive subq insulin.  TRH will continue to follow the patient.  HPI: Katherine Franklin is a 56 y.o. female with medical history significant for schizoaffective disorder, cocaine use with substance-induced mood disorder, T2DM, HTN who presented to the ED evening of 10/24 for agitation and delusions.  History is limited from patient due to somnolence and is otherwise supplemented by EDP and chart review.  Patient is frequently seen in the ED for manic or aggressive behavior.  She was brought back to the ED evening of 10/24 after she was found wandering naked and reportedly eating her own feces.  She was agitated and screaming.  Patient was refusing any kind of care on arrival.  She was placed under IVC.  Psychiatry were consulted and recommended inpatient psychiatric admission for stabilization and treatment of schizoaffective disorder with acute mania.  Labs show WBC 6.1, hemoglobin 10.9, platelets 288,000, sodium 137, potassium 3.8, bicarb 23, BUN 22, creatinine 0.98, serum glucose 338, LFTs within normal limits.  Serum ethanol, salicylate, and acetaminophen levels undetectable.  SARS-CoV-2 and influenza PCR negative.  UDS positive for cocaine.  She was noted to have chronic dry gangrenous appearing changes to toes on both of her feet.  X-rays of both feet were obtained.  There was small subcutaneous air loculation seen at the tip of the right second digit concerning for gas-forming infection.  EDP ordered MRI of the feet however patient refused.  She also stated that she would not want surgery even if it was indicated.  Review of Systems: Unable to review all systems due to lack of cooperation from patient. Past Medical History:  Diagnosis Date   Diabetes mellitus  without complication [facility])    Drug abuse (HCC) 09/22/2018   GERD (gastroesophageal reflux disease)    Hypertension    PTSD (post-traumatic stress disorder)    Schizo affective schizophrenia (HCC)    Past  Surgical History:  Procedure Laterality Date   SKIN GRAFT     TUBAL LIGATION     Social History:  reports that she has been smoking cigarettes. She has been smoking an average of 2 packs per day. She has never used smokeless tobacco. She reports that she does not currently use alcohol. She reports current drug use. Frequency: 7.00 times per week. Drugs: "Crack" cocaine and Cocaine.  Allergies  Allergen Reactions   Sulfa Antibiotics Shortness Of Breath, Rash and Swelling   Latex Rash    Family History  Family history unknown: Yes    Prior to Admission medications   Medication Sig Start Date End Date Taking? Authorizing Provider  lisinopril (ZESTRIL) 20 MG tablet Take 1 tablet (20 mg total) by mouth daily. Patient not taking: Reported on 03/07/2022 01/29/22   Molpus, Jonny Ruiz, MD  metFORMIN (GLUCOPHAGE-XR) 500 MG 24 hr tablet Take 4 tablets (2,000 mg total) by mouth every morning. Patient not taking: Reported on 03/07/2022 01/29/22 04/21/22  Molpus, Jonny Ruiz, MD  ondansetron (ZOFRAN) 8 MG tablet Take 1 tablet (8 mg total) by mouth every 8 (eight) hours as needed for nausea or vomiting. Patient not taking: Reported on 03/07/2022 01/29/22   Molpus, John, MD  pantoprazole (PROTONIX) 40 MG tablet Take 1 tablet (40 mg total) by mouth daily. Patient not taking: Reported on 03/07/2022 01/29/22   Molpus, Jonny Ruiz, MD    Physical Exam: Vitals:   03/07/22 0020 03/07/22 0952 03/07/22 1900  BP: 127/77 116/77 126/70  Pulse: 96 96   Resp: 16 20 18   Temp: 98.4 F (36.9 C) 98.3 F (36.8 C)   TempSrc: Oral Oral   SpO2: 97% 100% 91%   General: resting in bed, somnolent, will arouse briefly to answer questions HEENT: EOMI, no scleral icterus Cardiac: RRR, no rubs, murmurs or gallops Pulm: clear to auscultation bilaterally Abd: soft, nontender, nondistended Ext: Dry gangrenous changes multiple toes of both feet, chronic appearing as pictured below.  No active drainage or erythema.  Pedal pulses +2  bilaterally. Neuro: Somnolent, arouses briefly to answer questions.  Alert and oriented X3.                Data Reviewed:   There are no new results to review at this time.    Family Communication: None present. Primary team communication: Discussed with ED provider Thank you very much for involving in the care of your patient.  Author: [place], MD 03/08/2022 12:04 AM  For on call review [email or web address].

## 2022-03-07 NOTE — BH Assessment (Signed)
Clinician spoke to the secretary, pt is currently in triage and doesn't  have a room. Pt is not assigned a nurse. Clinician to check back.    Vertell Novak, [place], [facility], [facility] Triage Specialist [phone]

## 2022-03-07 NOTE — ED Notes (Signed)
Pt resting with eyes closed; respirations spontaneous, even, unlabored; sitter at bedside  

## 2022-03-07 NOTE — ED Notes (Signed)
The pt  is out of control security is standing at  the bedside  all the pt is being very disruptive   and security is standing by at  the bedside  she remains in the hallway

## 2022-03-07 NOTE — ED Notes (Signed)
Ativan 2mg  I'm  lt upper outer quadrNT

## 2022-03-07 NOTE — ED Provider Notes (Addendum)
Signout received on this 56 year old female who presented for psychiatric eval however while undergoing evaluation she was noted to have gangrene toes bilaterally.  MRI was ordered however patient was refusing.  She is under IVC.  She was initially found to be was naked running around and eating her own feces.  Initially deemed appropriate to make decision regarding her treatment however given her IVC and circumstances she was found and I do question capacity to make these decisions.  See attached pictures in reference to her toes.  I discussed patient with on-call podiatry who recommends obtaining the MRI but in the meantime starting patient on vancomycin, cefepime, and Flagyl for concern of gas forming right second toe.   Physical Exam  BP 126/70 (BP Location: Right Leg)   Pulse 96   Temp 98.3 F (36.8 C) (Oral)   Resp 18   SpO2 91%     Procedures  Procedures  ED Course / MDM    Medical Decision Making Amount and/or Complexity of Data Reviewed Labs: ordered. Radiology: ordered.  Risk Prescription drug management.   Case discussed with hospitalist team who state they will not be able to force patient to undergo treatment.  They recommend obtaining a formal capacity eval by psychiatry.  I did place a TTS consult.  They state if patient is not determined to have capacity admission to medicine can be revisited at that time.  Otherwise patient will board in the emergency room medicine will consult until she is medically cleared and placed in psych.   Signed out to oncoming provider Army Melia, PA-C.     Marita Kansas, PA-C 03/07/22 2316    Marita [place], PA-C 03/08/22 0036    Glynn Octave, MD 03/08/22 [phone]

## 2022-03-07 NOTE — Progress Notes (Signed)
Pt was under review at [facility] and RN Intake requested more information about pt's mobility, ADL's, and needed a to amputation. CSW inquire to care team and nursing advised that X-ray will be done.   Care Team notified: [facility] Lynnda Shields, RN, Sharyn Lull, PA-C, Molli Barrows, FNP, Godfrey Pick, MD   CSW received a call back from [facility] to advise that pt has been denied due to medical acuity.  CSW received a call from [employer] advising pt is denied due to no SI/HI. Advising that pt is seeking detox and they do not complete detox. Care team updated.   Benjaman Kindler, MSW, [facility] 03/07/2022 3:18 PM

## 2022-03-07 NOTE — Consult Note (Addendum)
[facility] ED ASSESSMENT   Reason for Consult:  Psychiatric Consult Referring Physician:  Larene Pickett, PA-C Patient Identification: Katherine Franklin MRN:  [id number] ED Chief Complaint: Schizoaffective disorder [facility])  Diagnosis:  Principal Problem:   Schizoaffective disorder ([place]) Active Problems:   Cocaine dependence ([place])   Substance induced mood disorder [facility])   ED Assessment Time Calculation: Start Time: 0930 Stop Time: 1000 Total Time in Minutes (Assessment Completion): [place], 56 y.o., female patient seen face to face by this provider, consulted with Dr. Dwyane Dee; and chart reviewed on 03/07/22.    Subjective:   Katherine Franklin is a 56 y.o. female, with schizoaffective disorder, cocaine use disorder,  patient admitted with under IVC petition, after being found wondering around naked and eating her own feces. Patient brought to [facility] by EMS.  HPI:   Katherine Franklin, 56 y.o. female, seen face to face, per psychiatric consult for evaluation. Patient arrived to [facility] under IVC petition, last evening and requiring Geodon due to agitation, yelling, and cursing at staff. She also made comments per EDP documentation, "I know where the bodies are buried". After receiving Geodon, patients agitation resolved and she required no further agitation medications throughout the night.   IVC petitioned by EPD, states the following, "History of PTSD, schizophrenia, polysubstance abuse, who was found running around naked at local gas station, eating her feces, and yelling about buried dead bodies."  On evaluation today, patient is eating breakfast and sitting upright on a stretcher in the hallway. She is oriented although, speech is pressured, however, patient endorses walking around somewhere without any clothing however does not recall eating her own feces.  She reports that she is homeless and uses crack cocaine nearly daily however has been without crack cocaine use for the last 3 days.  She endorses  that she has not slept in over several days.  Patient also endorses a history of schizoaffective disorder and is not actively taking any of her psychotropic medications.  She reports the last medications that she reports being prescribed was Abilify and Cogentin.  She reports at some point been established with [facility] in [place] but reports [place] is now where she considers home. She denies any family local. Reports being from [facility] and that she has been homeless since the age of 56 years old. 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 She endorse AH and VH, but denies experiencing any current hallucinations while speaking with this Probation officer. She reports history of inpatient mental heath admissions, thinks her last admission was for "drug treatment" at [facility]. Reports receiving disability and food stamp and appointment today to have all of "business moved to [place]". During the interview while asking patient questions, she is asking herself questions and then answering questions asked of herself. She is oriented to person, place, time, and situation. Katherine Franklin admits she feels better when on her medications and states, "I don't like having mental breakdowns". "I can't deal with it".  During evaluation Katherine Franklin is sitting upright on a stretcher, in no acute distress. She is alert, oriented x 4, appears anxious, cooperative with inattentiveness at times. Her mood is euphoric, depressed, and anxious (liable) with congruent affect.  Katherine Franklin speak is pressured. Objectively patient appears manic with grandiose thought patterns. Patient is easily distracted with circumstantial speech patterns. She also denies suicidal/self-harm/homicidal ideation and denies current auditory and visual hallucinations. Patient meets inpatient criteria due to acute exacerbation  of schizoaffective disorder with mania and psychotic features.   Past Psychiatric History:   Schizoaffective Disorder  PTSD Last chronic psychoactive medications: Abilify  and Cogentin (patient reports)     Risk to Self or Others: Is the patient at risk to self? Yes Has the patient been a risk to self in the past 6 months? Yes Has the patient been a risk to self within the distant past? Yes Is the patient a risk to others? No Has the patient been a risk to others in the past 6 months? No Has the patient been a risk to others within the distant past? No  [place] Scale:  Flowsheet Row ED [facility]  C-SSRS RISK CATEGORY No Risk No Risk No Risk       AIMS:  , , ,  ,   ASAM:    Substance Abuse:     Past Medical History:  Past Medical History:  Diagnosis Date   Diabetes mellitus without complication (HCC)    Drug abuse (HCC) 09/22/2018   GERD (gastroesophageal reflux disease)    Hypertension    PTSD (post-traumatic stress disorder)    Schizo affective schizophrenia (HCC)     Past Surgical History:  Procedure Laterality Date   SKIN GRAFT     TUBAL LIGATION     Family History:  Family History  Family history unknown: Yes    Social History:  Social History   Substance and Sexual Activity  Alcohol Use Not Currently     Social History   Substance and Sexual Activity  Drug Use Yes   Frequency: 7.0 times per week   Types: "Crack" cocaine, Cocaine   Comment: undetermined amount    Social History   Socioeconomic History   Marital status: Single    Spouse name: Not on file   Number of children: Not on file   Years of education: Not on file   Highest education level: Not on file  Occupational History   Occupation: Prostitute  Tobacco Use   Smoking status: Every Day    Packs/day: 2.00    Types: Cigarettes   Smokeless tobacco: Never  Vaping Use    Vaping Use: Never used  Substance and Sexual Activity   Alcohol use: Not Currently   Drug use: Yes    Frequency: 7.0 times per week    Types: "Crack" cocaine, Cocaine    Comment: undetermined amount   Sexual activity: Not Currently  Other Topics Concern   Not on file  Social History Narrative          Pt indicated that she lives in [place], 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   Social Determinants of Health   Financial Resource Strain: Not on file  Food Insecurity: Not on file  Transportation Needs: Not on file  Physical Activity: Not on file  Stress: Not on file  Social Connections: Not on file   Additional Social History:    Allergies:   Allergies  Allergen Reactions   Sulfa Antibiotics Shortness Of Breath, Rash and Swelling   Latex Rash    Labs:  Results for orders placed or performed during the hospital encounter of 03/06/22 (from the past 48 hour(s))  CBC with Differential     Status: Abnormal   Collection Time: 03/06/22  9:11 PM  Result Value Ref  Range   WBC 6.1 4.0 - 10.5 K/uL   RBC 3.96 3.87 - 5.11 MIL/uL   Hemoglobin 10.9 (L) 12.0 - 15.0 g/dL   HCT 15.1 (L) 76.1 - 60.7 %   MCV 87.4 80.0 - 100.0 fL   MCH 27.5 26.0 - 34.0 pg   MCHC 31.5 30.0 - 36.0 g/dL   RDW 37.1 06.2 - 69.4 %   Platelets 288 150 - 400 K/uL   nRBC 0.0 0.0 - 0.2 %   Neutrophils Relative % 51 %   Neutro Abs 3.1 1.7 - 7.7 K/uL   Lymphocytes Relative 37 %   Lymphs Abs 2.3 0.7 - 4.0 K/uL   Monocytes Relative 8 %   Monocytes Absolute 0.5 0.1 - 1.0 K/uL   Eosinophils Relative 3 %   Eosinophils Absolute 0.2 0.0 - 0.5 K/uL   Basophils Relative 1 %   Basophils Absolute 0.1 0.0 - 0.1 K/uL   Immature Granulocytes 0 %   Abs Immature Granulocytes 0.01 0.00 - 0.07 K/uL    Comment: Performed at [facility], [address] [place], [place] [address]  Comprehensive metabolic panel     Status: Abnormal   Collection Time: 03/06/22  9:11 PM  Result Value Ref Range    Sodium 137 135 - 145 mmol/L   Potassium 3.8 3.5 - 5.1 mmol/L   Chloride 101 98 - 111 mmol/L   CO2 23 22 - 32 mmol/L   Glucose, Bld 338 (H) 70 - 99 mg/dL    Comment: Glucose reference range applies only to samples taken after fasting for at least 8 hours.   BUN 22 (H) 6 - 20 mg/dL   Creatinine, Ser 7.03 0.44 - 1.00 mg/dL   Calcium 9.3 8.9 - 50.0 mg/dL   Total Protein 7.1 6.5 - 8.1 g/dL   Albumin 3.6 3.5 - 5.0 g/dL   AST 21 15 - 41 U/L   ALT 22 0 - 44 U/L   Alkaline Phosphatase 100 38 - 126 U/L   Total Bilirubin 0.5 0.3 - 1.2 mg/dL   GFR, Estimated >93 >81 mL/min    Comment: (NOTE) Calculated using the CKD-EPI Creatinine Equation (2021)    Anion gap 13 5 - 15    Comment: Performed at [facility], [address], [place], [place] [address]  Ethanol     Status: None   Collection Time: 03/06/22  9:11 PM  Result Value Ref Range   Alcohol, Ethyl (B) <10 <10 mg/dL    Comment: (NOTE) Lowest detectable limit for serum alcohol is 10 mg/dL.  For medical purposes only. Performed at [facility], [address], [place], [place] [address]   Salicylate level     Status: Abnormal   Collection Time: 03/06/22  9:11 PM  Result Value Ref Range   Salicylate Lvl <7.0 (L) 7.0 - 30.0 mg/dL    Comment: Performed at [facility], [address] [place], [place] [address]  Acetaminophen level     Status: Abnormal   Collection Time: 03/06/22  9:11 PM  Result Value Ref Range   Acetaminophen (Tylenol), Serum <10 (L) 10 - 30 ug/mL    Comment: (NOTE) Therapeutic concentrations vary significantly. A range of 10-30 ug/mL  may be an effective concentration for many patients. However, some  are best treated at concentrations outside of this range. Acetaminophen concentrations >150 ug/mL at 4 hours after ingestion  and >50 ug/mL at 12 hours after ingestion are often associated with  toxic reactions.  Performed at [facility], [address] [place], [place] [address]      Current Facility-Administered Medications  Medication Dose Route Frequency Provider Last Rate Last Admin   LORazepam (ATIVAN) injection 1 mg  1 mg Intramuscular Q6H PRN Bing Neighbors, FNP       ziprasidone (GEODON) injection 20 mg  20 mg Intramuscular Q12H PRN Bing Neighbors, FNP       And   LORazepam (ATIVAN) tablet 1 mg  1 mg Oral PRN Bing Neighbors, FNP       Current Outpatient Medications  Medication Sig Dispense Refill   lisinopril (ZESTRIL) 20 MG tablet Take 1 tablet (20 mg total) by mouth daily. (Patient not taking: Reported on 03/07/2022) 30 tablet 0   metFORMIN (GLUCOPHAGE-XR) 500 MG 24 hr tablet Take 4 tablets (2,000 mg total) by mouth every morning. (Patient not taking: Reported on 03/07/2022) 120 tablet 0   ondansetron (ZOFRAN) 8 MG tablet Take 1 tablet (8 mg total) by mouth every 8 (eight) hours as needed for nausea or vomiting. (Patient not taking: Reported on 03/07/2022) 10 tablet 0   pantoprazole (PROTONIX) 40 MG tablet Take 1 tablet (40 mg total) by mouth daily. (Patient not taking: Reported on 03/07/2022) 30 tablet 0    Psychiatric Specialty Exam: Presentation  General Appearance:  Disheveled  Eye Contact: Good  Speech: Clear and Coherent  Speech Volume: Normal  Handedness: Right   Mood and Affect  Mood: Labile  Affect: Blunt   Thought Process  Thought Processes: Disorganized  Descriptions of Associations:Circumstantial  Orientation:Full (Time, Place and Person)  Thought Content:Rumination; Scattered  History of Schizophrenia/Schizoaffective disorder:No data recorded Duration of Psychotic Symptoms:No data recorded Hallucinations:Hallucinations: None  Ideas of Reference:Other (comment) ([employer] -she is Chartered loss adjuster and poet)  Suicidal Thoughts:Suicidal Thoughts: No  Homicidal Thoughts:Homicidal Thoughts: No   Sensorium  Memory: Immediate Fair; Recent Fair; Remote  Fair  Judgment: Poor  Insight: Lacking   Executive Functions  Concentration: Fair  Attention Span: Fair  Recall: [employer] of Knowledge: Fair  Language: Fair   Psychomotor Activity  Psychomotor Activity: Psychomotor Activity: Normal   Assets  Assets: Communication Skills; Desire for Improvement    Sleep  Sleep: Sleep: Poor Number of Hours of Sleep: 0 (awake for several days due to cocaine abuse)   Physical Exam: Physical Exam Constitutional:      General: She is not in acute distress.    Appearance: She is not ill-appearing or toxic-appearing.     Comments: Disheveled appearance   HENT:     Head: Normocephalic.  Eyes:     Extraocular Movements: Extraocular movements intact.     Pupils: Pupils are equal, round, and reactive to light.  Cardiovascular:     Rate and Rhythm: Normal rate.  Pulmonary:     Effort: Pulmonary effort is normal.     Breath sounds: Normal breath sounds.  Musculoskeletal:     Cervical back: Normal range of motion.  Neurological:     Mental Status: She is alert.  Psychiatric:        Behavior: Behavior is cooperative.    Review of Systems  Psychiatric/Behavioral:  Positive for substance abuse. Negative for depression and suicidal ideas. The patient is nervous/anxious and has insomnia.    Blood pressure 116/77, pulse 96, temperature 98.3 F (36.8 C), temperature source Oral, resp. rate 20, SpO2 100 %. There is no height or weight on file to calculate BMI.  Medical Decision Making: Patient  case review and discussed with Dr. Lucianne Muss. Patient meets criteria for inpatient psychiatric treatment. Due to current Schizoaffective disorder with acute mania and distorted thinking, patient poses risk for safety and needs inpatient psychiatric treatment for medication management, stabilization, and safety.  There is currently no bed availability at [facility]. CSW notified and will be faxing patient out. EDP, RN, LCSW, notified of disposition.    Schizoaffective Disorder- Restart Abilify 10 mg daily , ECG reviewed, no QT prolongation and WBC unremarkable Agitation protocol ordered, Ativan and Geodon PRN, every 12 hrs   Disposition: Recommend psychiatric inpatient admission    Joaquin Courts, FNP-C, PMHNP-BC 03/07/2022 10:14 AM

## 2022-03-07 NOTE — ED Notes (Signed)
IVC PAPERS COMPLETED 

## 2022-03-07 NOTE — ED Notes (Signed)
Benadryl 50mg  given I'm  by Jena Gauss

## 2022-03-08 ENCOUNTER — Encounter (HOSPITAL_COMMUNITY): Payer: Medicaid Other

## 2022-03-08 DIAGNOSIS — I96 Gangrene, not elsewhere classified: Secondary | ICD-10-CM | POA: Diagnosis present

## 2022-03-08 DIAGNOSIS — F250 Schizoaffective disorder, bipolar type: Secondary | ICD-10-CM | POA: Diagnosis not present

## 2022-03-08 DIAGNOSIS — F14250 Cocaine dependence with cocaine-induced psychotic disorder with delusions: Secondary | ICD-10-CM | POA: Diagnosis not present

## 2022-03-08 DIAGNOSIS — E1142 Type 2 diabetes mellitus with diabetic polyneuropathy: Secondary | ICD-10-CM | POA: Diagnosis not present

## 2022-03-08 LAB — CBC
HCT: 34.4 % — ABNORMAL LOW (ref 36.0–46.0)
Hemoglobin: 11.3 g/dL — ABNORMAL LOW (ref 12.0–15.0)
MCH: 27.8 pg (ref 26.0–34.0)
MCHC: 32.8 g/dL (ref 30.0–36.0)
MCV: 84.7 fL (ref 80.0–100.0)
Platelets: 273 10*3/uL (ref 150–400)
RBC: 4.06 MIL/uL (ref 3.87–5.11)
RDW: 12.8 % (ref 11.5–15.5)
WBC: 5.3 10*3/uL (ref 4.0–10.5)
nRBC: 0 % (ref 0.0–0.2)

## 2022-03-08 LAB — HIV ANTIBODY (ROUTINE TESTING W REFLEX): HIV Screen 4th Generation wRfx: NONREACTIVE

## 2022-03-08 LAB — COMPREHENSIVE METABOLIC PANEL
ALT: 19 U/L (ref 0–44)
AST: 14 U/L — ABNORMAL LOW (ref 15–41)
Albumin: 3.3 g/dL — ABNORMAL LOW (ref 3.5–5.0)
Alkaline Phosphatase: 94 U/L (ref 38–126)
Anion gap: 8 (ref 5–15)
BUN: 14 mg/dL (ref 6–20)
CO2: 26 mmol/L (ref 22–32)
Calcium: 9.4 mg/dL (ref 8.9–10.3)
Chloride: 103 mmol/L (ref 98–111)
Creatinine, Ser: 0.86 mg/dL (ref 0.44–1.00)
GFR, Estimated: >60 mL/min (ref >60)
Glucose, Bld: 272 mg/dL — ABNORMAL HIGH (ref 70–99)
Potassium: 3.9 mmol/L (ref 3.5–5.1)
Sodium: 137 mmol/L (ref 135–145)
Total Bilirubin: 0.4 mg/dL (ref 0.3–1.2)
Total Protein: 6.9 g/dL (ref 6.5–8.1)

## 2022-03-08 LAB — GLUCOSE, CAPILLARY
Glucose-Capillary: 306 mg/dL — ABNORMAL HIGH (ref 70–99)
Glucose-Capillary: 160 mg/dL — ABNORMAL HIGH (ref 70–99)

## 2022-03-08 LAB — HEMOGLOBIN A1C
Hgb A1c MFr Bld: 11.3 % — ABNORMAL HIGH (ref 4.8–5.6)
Mean Plasma Glucose: 277.61 mg/dL

## 2022-03-08 LAB — SEDIMENTATION RATE: Sed Rate: 29 mm/hr — ABNORMAL HIGH (ref 0–22)

## 2022-03-08 LAB — PROTIME-INR
INR: 0.9 (ref 0.8–1.2)
Prothrombin Time: 12.1 seconds (ref 11.4–15.2)

## 2022-03-08 LAB — LACTIC ACID, PLASMA: Lactic Acid, Venous: 1.3 mmol/L (ref 0.5–1.9)

## 2022-03-08 LAB — C-REACTIVE PROTEIN: CRP: 0.8 mg/dL (ref <1.0)

## 2022-03-08 LAB — TSH: TSH: 0.19 u[IU]/mL — ABNORMAL LOW (ref 0.350–4.500)

## 2022-03-08 MED ORDER — ZIPRASIDONE MESYLATE 20 MG IM SOLR
20.0000 mg | Freq: Once | INTRAMUSCULAR | Status: AC
  Administered 2022-03-08: 20 mg via INTRAMUSCULAR

## 2022-03-08 MED ORDER — SODIUM CHLORIDE 0.9 % IV SOLN
2.0000 g | Freq: Three times a day (TID) | INTRAVENOUS | Status: DC
  Administered 2022-03-09 – 2022-03-10 (×3): 2 g via INTRAVENOUS
  Filled 2022-03-08 (×4): qty 12.5

## 2022-03-08 MED ORDER — ACETAMINOPHEN 325 MG PO TABS: 650.0000 mg | ORAL_TABLET | Freq: Four times a day (QID) | ORAL | Status: DC | PRN

## 2022-03-08 MED ORDER — ACETAMINOPHEN 650 MG RE SUPP: 650.0000 mg | Freq: Four times a day (QID) | RECTAL | Status: DC | PRN

## 2022-03-08 MED ORDER — STERILE WATER FOR INJECTION IJ SOLN
INTRAMUSCULAR | Status: AC
  Filled 2022-03-08: qty 10

## 2022-03-08 MED ORDER — HALOPERIDOL LACTATE 5 MG/ML IJ SOLN
5.0000 mg | Freq: Once | INTRAMUSCULAR | Status: AC
  Administered 2022-03-08: 5 mg via INTRAMUSCULAR
  Filled 2022-03-08: qty 1

## 2022-03-08 MED ORDER — AMOXICILLIN-POT CLAVULANATE 875-125 MG PO TABS: 1.0000 | ORAL_TABLET | Freq: Two times a day (BID) | ORAL | Status: DC

## 2022-03-08 MED ORDER — LORAZEPAM 2 MG/ML IJ SOLN
2.0000 mg | Freq: Once | INTRAMUSCULAR | Status: AC
  Administered 2022-03-08: 2 mg via INTRAMUSCULAR
  Filled 2022-03-08: qty 1

## 2022-03-08 MED ORDER — METRONIDAZOLE 500 MG/100ML IV SOLN
500.0000 mg | Freq: Two times a day (BID) | INTRAVENOUS | Status: DC
  Administered 2022-03-08 – 2022-03-09 (×3): 500 mg via INTRAVENOUS
  Filled 2022-03-08 (×3): qty 100

## 2022-03-08 MED ORDER — INSULIN ASPART 100 UNIT/ML IJ SOLN
0.0000 [IU] | Freq: Three times a day (TID) | INTRAMUSCULAR | Status: DC
  Administered 2022-03-08 – 2022-03-09 (×2): 7 [IU] via SUBCUTANEOUS
  Administered 2022-03-10: 9 [IU] via SUBCUTANEOUS
  Administered 2022-03-10: 3 [IU] via SUBCUTANEOUS
  Administered 2022-03-11: 7 [IU] via SUBCUTANEOUS
  Administered 2022-03-12: 5 [IU] via SUBCUTANEOUS
  Administered 2022-03-13: 7 [IU] via SUBCUTANEOUS
  Administered 2022-03-14 (×2): 9 [IU] via SUBCUTANEOUS
  Administered 2022-03-14: 5 [IU] via SUBCUTANEOUS
  Administered 2022-03-15: 9 [IU] via SUBCUTANEOUS

## 2022-03-08 MED ORDER — DIPHENHYDRAMINE HCL 50 MG/ML IJ SOLN: 50.0000 mg | Freq: Once | INTRAMUSCULAR | Status: DC

## 2022-03-08 MED ORDER — LORAZEPAM 2 MG/ML IJ SOLN
2.0000 mg | Freq: Four times a day (QID) | INTRAMUSCULAR | Status: DC | PRN
  Administered 2022-03-08 (×2): 2 mg via INTRAMUSCULAR
  Filled 2022-03-08 (×2): qty 1

## 2022-03-08 MED ORDER — VANCOMYCIN HCL 750 MG/150ML IV SOLN
750.0000 mg | Freq: Two times a day (BID) | INTRAVENOUS | Status: DC
  Administered 2022-03-08 – 2022-03-09 (×3): 750 mg via INTRAVENOUS
  Filled 2022-03-08 (×3): qty 150

## 2022-03-08 NOTE — Plan of Care (Signed)

## 2022-03-08 NOTE — ED Notes (Signed)
Pt refuses vitals  

## 2022-03-08 NOTE — ED Notes (Signed)
Pt awake after dose of Ativan and Haldol

## 2022-03-08 NOTE — Consult Note (Signed)
PODIATRY CONSULTATION  NAME Katherine Franklin MRN [id number] DOB 1966/03/10 DOA 03/06/2022   Reason for consult:  Chief Complaint  Patient presents with   Mental Health Problem    Attending/Consulting physician: Darreld Mclean MD  History of present illness: 56 y.o. female with medical history significant for schizoaffective disorder, cocaine use with substance-induced mood disorder, T2DM, HTN who presented to the ED evening of 10/24 for agitation and delusions. History is limited from patient due to somnolence and is otherwise supplemented by EDP and chart review. Brought into ED for wandering naked and eating her own feces. She is somnolent and difficult to understand / does not answer questions with complete sentences / mumbles this AM.  She was noted to have chronic dry gangrenous appearing changes to toes on both of her feet.  X-rays of both feet were obtained.  There was small subcutaneous air loculation seen at the tip of the right second digit concerning for gas-forming infection. EDP ordered MRI of the feet however patient refused.  She also stated that she would not want surgery even if it was indicated. This morning, she states to do what we need to do, though unclear if she actually understands my recommendation to get MRI.   Past Medical History:  Diagnosis Date   Diabetes mellitus without complication (HCC)    Drug abuse (HCC) 09/22/2018   GERD (gastroesophageal reflux disease)    Hypertension    PTSD (post-traumatic stress disorder)    Schizo affective schizophrenia (HCC)        Latest Ref Rng & Units 03/06/2022    9:11 PM 03/04/2022    7:03 AM 02/24/2022    8:35 PM  CBC  WBC 4.0 - 10.5 K/uL 6.1  5.1  5.6   Hemoglobin 12.0 - 15.0 g/dL 89.3  81.0  17.5   Hematocrit 36.0 - 46.0 % 34.6  38.9  33.3   Platelets 150 - 400 K/uL 288  348  310        Latest Ref Rng & Units 03/06/2022    9:11 PM 03/04/2022    7:03 AM 02/24/2022    8:35 PM  BMP  Glucose 70 - 99 mg/dL 102   585  277   BUN 6 - 20 mg/dL 22  26  17    Creatinine 0.44 - 1.00 mg/dL  8.24  2.35   Sodium 135 - 145 mmol/L 137  134  136   Potassium 3.5 - 5.1 mmol/L 3.8  4.0  3.7   Chloride 98 - 111 mmol/L 101  98  102   CO2 22 - 32 mmol/L 23  24  24    Calcium 8.9 - 10.3 mg/dL 9.3  9.8  9.3       Physical Exam: Lower Extremity Exam Vasc: R - PT non palpable, DP 2+ palpable.   L - PT non palpable, DP 2+ palpable.   Derm: R - 2nd digit with distal ulceration and crepitus noted distal digit with hyperkeratotic tissue and dry gangrenous changes no active drainage or malodor. 3rd and 4th digit also with discoloration and early gangrenous changes but no open wound. No significant erythema on exam, mild edema of the right foot.        L - Hyperkeratotic tissue at tips of digits, no evidence of deep ulceration or significant gangrenous changes.       MSK:  R - HAV deformity, hammertoe deformity lesser digits  L - HAV deformity, hammertoe deformity lesser digits  Neuro: R -  Gross sensation diminished. Gross motor function intact   L - Gross sensation diminished. Gross motor function intact    ASSESSMENT/PLAN OF CARE 56 y.o. female with PMHx significant for  schizoaffective disorder, cocaine use with substance-induced mood disorder, T2DM, HTN with dry gangrene and likely chronic osteomyelitis of the distal phalanx Right 2nd toe.  No evidence of rapidly progressing infectious process at this time.  - Recommend MRI to the Right foot if patient agreeable. Recommend ABI studies given non palpable PT pulses bilaterally despite good DP pulse - Surgical plans pending ongoing workup, likely will recommend her to have R 2nd toe amputation this admission - Agree with psych eval for medical decision making capacity - Continue IV abx broad spectrum pending further culture data - Anticoagulation: per primary - Wound care: betadine paint applied to R 2nd toe per RN - WB status: WBAT to BLE at this  time - Will continue to follow   Thank you for the consult.  Please contact me directly with any questions or concerns.           Everitt Amber, DPM [facility]    [address], [place] [address]                Office [phone]  Fax [phone]

## 2022-03-08 NOTE — ED Notes (Signed)
Pt redirected to her room but Pt continues to curse at staff

## 2022-03-08 NOTE — ED Notes (Signed)
Pt has been awake and ate lunch. Pt had an violent out burst . Pt dug feces out of rectum and smeared feces on door to her room and on computer Sitter was using in hall

## 2022-03-08 NOTE — ED Notes (Signed)
PT now in recliner and sleeping after meds given earlier.

## 2022-03-08 NOTE — Assessment & Plan Note (Signed)
UDS positive for cocaine Avoid beta blockers Follow psych recommendations SW consult
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
## 2022-03-08 NOTE — ED Notes (Signed)
Attempted to start IV on pt. Pt began kicking and screaming. MD paged, awaiting call back

## 2022-03-08 NOTE — ED Notes (Signed)
PT refuses vitals.

## 2022-03-08 NOTE — ED Provider Notes (Addendum)
Care of the patient received from overnight team.  I evaluated the patient yesterday.  Ongoing issues with the patient refusing MRI of her feet with concerns for possible soft tissue infection in the setting of psychosis.  Evaluated by Tera Mater w/ Psychiatry the patient and deemed her unable to make her own medical decisions, she is acutely psychotic, refusing any interviews, urinating on the floor.  I consulted Dr. Eliberto Ivory with the [facility] hospitalist team for admission as she will likely need to get these MRIs under anesthesia.  Dr. Eliberto Ivory will admit the patient for ongoing treatment.     Garald Balding, PA-C 03/08/22 [place], Ankit, MD 03/09/22 [phone]

## 2022-03-08 NOTE — Progress Notes (Signed)
Consult follow-up note  Reason for consultation: Dry gangrene   History of Present Illness: 56 year old black female known continued cocaine abuse, DM TY 2, HTN, schizoaffective disorder + PTSD-has not taken meds in 5 years patient has visited the Emergency room several times over the past 2 months She started coming into the ED in 01/2022-actually had imaging 01/29/2022 which showed dry gangrene not requiring intervention she is coming several times for hypoglycemia sugars into the 400s frequently eloping as well--1 of these episodes involved homicidal ideation and she was going to be admitted but then decided against it  Patient family came back to the emergency room 10/24--- was walking around naked at [facility] eating her own feces agitated aggressive delusions of persecution--- in the ED Rx Geodon Was medically cleared and TTS consult was attempted--was seen by psychiatry and IVC They initiated Abilify 10 daily with Ativan and Geodon every 12 as needed for agitation  Hospitalist service was consulted because of dry gangrene bilateral feet X-ray showed changes concerning for gas formation but otherwise no sign of active systemic infection--- White count 6  Patient absolutely declined any surgical intervention   BP (!) 172/90 (BP Location: Right Leg)   Pulse 81   Temp 97.9 F (36.6 C) (Oral)   Resp 18   SpO2 97%  I discussed with nursing staff at the bedside patient is completely obtunded and had received both Phenergan as well as Haldol this morning because was agitated I also had a discussion with Dr. Dwyane Dee briefly about this patient-it is unclear whether the patient has capacity which is likely doubtful-as patient is still belligerent agitated and refusing all interventions, I would switch antibiotics to Augmentin orally to see if she is willing to take the same.  If her mind changes and or she consents to MRI and interventions inclusive of IV antibiotics we are certainly happy to  facilitate that direction of her care.   She remains under care of both the emergency room and psychiatry and is probably awaiting placement to an inpatient psychiatric facility-let me know if there are other issues that arise.  We will follow only peripherally at this time--and I will try to visit with her when she is more awake  Verneita Griffes, MD [facility] 11:20 AM

## 2022-03-08 NOTE — Progress Notes (Signed)
Patient has refused evening CBG at this time

## 2022-03-08 NOTE — ED Notes (Signed)
PT refuse all meds and vitals
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
## 2022-03-08 NOTE — Progress Notes (Signed)
Pt has been denied at [facility] due to medical. Provider Molli Barrows, FNP was update.   Katherine Franklin, MSW, LCSWA 03/08/2022 1:01 PM

## 2022-03-08 NOTE — Assessment & Plan Note (Addendum)
No recent A1C in system Repeat pending, but has refused lab draws  Not adherent to home meds- hold metformin while inpatient SSI and accuchecks QAC/HS if patient tolerates

## 2022-03-08 NOTE — ED Notes (Signed)
Pt out of room ,shouting at staff and other patients. Pt stood at bathroom door and defecated on floor of BR.

## 2022-03-08 NOTE — Progress Notes (Signed)
[facility]  03/08/2022 12:29 PM Katherine SchimkeMechell Franklin  MRN:  [id number] Subjective:  Bizarre Behavior and Aggression Principal Problem: Psychosis (HCC) Diagnosis: Principal Problem:   Psychosis (HCC) Active Problems:   Cocaine dependence (HCC)   Substance induced mood disorder (HCC)   Type 2 diabetes mellitus (HCC)   Dry gangrene toes of bilateral feet (HCC)  Total Time spent with patient: 45 minutes   Katherine Franklin is a 56 y.o. female currently under IVC  petition, with a psychiatric history of  currently Schizoaffective disorder (HCC), Cocaine Dependence,  Psychiatry was originally consulted to address the patient's symptoms of out of control behavior and psychosis.   Patient evaluated face-to-face to determine mental capacity to consent to medical procedure.  This Clinical research associatewriter along with Hillery Jacksanika Lewis, psychiatric mental health nurse practitioner attempted to evaluate patient to determine capacity today, however on multiple attempts patient was sleeping and difficult to arouse.  After patient had been awakened for 30 minutes and had ambulated to the restroom, this writer went into her room to perform a mental capacity evaluation, patient immediately became agitated, refused to interact in any form of conversation, proceeded to jump out of bed, stated she needed to go to the bathroom however pulled her pants down and sat on a bedside commode which she was informed did not have a hat to catch her urine and proceeded to urinate on the floor.  This morning the RN reported that patient stood in front of the bathroom and defecated in the floor.  When patient is not sedated she is belligerent, agitated, constantly cursing at staff, and has required multiple doses of Geodon yesterday while in the emergency department.   Background patient was brought in under IVC on 03/06/2022, after being found wandering naked at a convenience store eating her own feces.  In review of EMR patient was prescribed Abilify  which was restarted, however patient has refused all psychiatric and chronic condition medications.  On medical evaluation by EDT patient was found to darken toes on the right foot and there was some concern for necrosis.  Patient agreed to x-ray however has continuously refused to obtain an MRI, receive IV antibiotics as prescribed or take oral antibiotics.  Patient has severe uncontrolled diabetes and hypertension and has refused all diabetic medications since being here in the emergency department.  During evaluation Katherine Franklin is initially laying in bed with HOB elevated, however, when this writer begins interview, patient becomes belligerent and request to be removed from the "crib".  This Clinical research associatewriter did inform patient that she was in a bed she immediately slid to the end of the bed jumped up and was advised by the sitter that she would escort her to the restroom however she proceeded to pull her pants down and urinate on a bedside commode but did not have a hat attached.  She is alert, and agitated unable to answer questions pertaining orientation.  She is inattentive and uncooperative.  Her mood is liable, angry, irritable and anxious.  Her speech is pressured.     Capacity Evaluation  Objectively, Katherine Franklin, does not have the current mental capacity to make any medical decision and she is actively psychotic which is further impairing her ability , judgement, and capacity to make sound logical decisions.   Past Medical History:  Past Medical History:  Diagnosis Date   Diabetes mellitus without complication (HCC)    Drug abuse (HCC) 09/22/2018   GERD (gastroesophageal reflux disease)    Hypertension  PTSD (post-traumatic stress disorder)    Schizo affective schizophrenia (HCC)     Past Surgical History:  Procedure Laterality Date   SKIN GRAFT     TUBAL LIGATION     Family History:  Family History  Family history unknown: Yes    Social History:  Social History   Substance  and Sexual Activity  Alcohol Use Not Currently     Social History   Substance and Sexual Activity  Drug Use Yes   Frequency: 7.0 times per week   Types: "Crack" cocaine, Cocaine   Comment: undetermined amount    Social History   Socioeconomic History   Marital status: Single    Spouse name: Not on file   Number of children: Not on file   Years of education: Not on file   Highest education level: Not on file  Occupational History   Occupation: Prostitute  Tobacco Use   Smoking status: Every Day    Packs/day: 2.00    Types: Cigarettes   Smokeless tobacco: Never  Vaping Use   Vaping Use: Never used  Substance and Sexual Activity   Alcohol use: Not Currently   Drug use: Yes    Frequency: 7.0 times per week    Types: "Crack" cocaine, Cocaine    Comment: undetermined amount   Sexual activity: Not Currently  Other Topics Concern   Not on file  Social History Narrative   ** Merged History Encounter **       Pt indicated that she lives in [place], 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   Social Determinants of Health   Financial Resource Strain: Not on file  Food Insecurity: Not on file  Transportation Needs: Not on file  Physical Activity: Not on file  Stress: Not on file  Social Connections: Not on file   Additional Social History:                      Sleep:  Requires sedative medications in order to achieve sleep during this hospital admission.  Appetite:  Good  Current Medications: Current Facility-Administered Medications  Medication Dose Route Frequency Provider Last Rate Last Admin   amoxicillin-clavulanate (AUGMENTIN) 875-125 MG per tablet 1 tablet  1 tablet Oral BID Rhetta Mura, MD       ARIPiprazole (ABILIFY) tablet 10 mg  10 mg Oral Daily Bing Neighbors, FNP       benztropine (COGENTIN) tablet 2 mg  2 mg Oral Daily Bing Neighbors, FNP       LORazepam (ATIVAN) injection 1 mg  1 mg Intramuscular Q6H PRN Bing Neighbors, FNP   1 mg at 03/08/22 0047   ziprasidone (GEODON) injection 20 mg  20 mg Intramuscular Q12H PRN Bing Neighbors, FNP   20 mg at 03/07/22 1653   And   LORazepam (ATIVAN) tablet 1 mg  1 mg Oral PRN Bing Neighbors, FNP       vancomycin (VANCOREADY) IVPB 2000 mg/400 mL  2,000 mg Intravenous Once Rancour, Stephen, MD       Current Outpatient Medications  Medication Sig Dispense Refill   lisinopril (ZESTRIL) 20 MG tablet Take 1 tablet (20 mg total) by mouth daily. (Patient not taking: Reported on 03/07/2022) 30 tablet 0   metFORMIN (GLUCOPHAGE-XR) 500 MG 24 hr tablet Take 4 tablets (2,000 mg total) by mouth every morning. (Patient not taking: Reported on 03/07/2022) 120 tablet 0   ondansetron (ZOFRAN) 8 MG tablet Take  1 tablet (8 mg total) by mouth every 8 (eight) hours as needed for nausea or vomiting. (Patient not taking: Reported on 03/07/2022) 10 tablet 0   pantoprazole (PROTONIX) 40 MG tablet Take 1 tablet (40 mg total) by mouth daily. (Patient not taking: Reported on 03/07/2022) 30 tablet 0    Lab Results:  Results for orders placed or performed during the hospital encounter of 03/06/22 (from the past 48 hour(s))  CBC with Differential     Status: Abnormal   Collection Time: 03/06/22  9:11 PM  Result Value Ref Range   WBC 6.1 4.0 - 10.5 K/uL   RBC 3.96 3.87 - 5.11 MIL/uL   Hemoglobin 10.9 (L) 12.0 - 15.0 g/dL   HCT 34.6 (L) 36.0 - 46.0 %   MCV 87.4 80.0 - 100.0 fL   MCH 27.5 26.0 - 34.0 pg   MCHC 31.5 30.0 - 36.0 g/dL   RDW 13.2 11.5 - 15.5 %   Platelets 288 150 - 400 K/uL   nRBC 0.0 0.0 - 0.2 %   Neutrophils Relative % 51 %   Neutro Abs 3.1 1.7 - 7.7 K/uL   Lymphocytes Relative 37 %   Lymphs Abs 2.3 0.7 - 4.0 K/uL   Monocytes Relative 8 %   Monocytes Absolute 0.5 0.1 - 1.0 K/uL   Eosinophils Relative 3 %   Eosinophils Absolute 0.2 0.0 - 0.5 K/uL   Basophils Relative 1 %   Basophils Absolute 0.1 0.0 - 0.1 K/uL   Immature Granulocytes 0 %   Abs Immature  Granulocytes 0.01 0.00 - 0.07 K/uL    Comment: Performed at [facility], [address], [place], [place] [address]  Comprehensive metabolic panel     Status: Abnormal   Collection Time: 03/06/22  9:11 PM  Result Value Ref Range   Sodium 137 135 - 145 mmol/L   Potassium 3.8 3.5 - 5.1 mmol/L   Chloride 101 98 - 111 mmol/L   CO2 23 22 - 32 mmol/L   Glucose, Bld 338 (H) 70 - 99 mg/dL    Comment: Glucose reference range applies only to samples taken after fasting for at least 8 hours.   BUN 22 (H) 6 - 20 mg/dL   Creatinine, Ser 0.98 0.44 - 1.00 mg/dL   Calcium 9.3 8.9 - 10.3 mg/dL   Total Protein 7.1 6.5 - 8.1 g/dL   Albumin 3.6 3.5 - 5.0 g/dL   AST 21 15 - 41 U/L   ALT 22 0 - 44 U/L   Alkaline Phosphatase 100 38 - 126 U/L   Total Bilirubin 0.5 0.3 - 1.2 mg/dL   GFR, Estimated >60 >60 mL/min    Comment: (NOTE) Calculated using the CKD-EPI Creatinine Equation (2021)    Anion gap 13 5 - 15    Comment: Performed at [facility] [address], [place], [place] [address]  Ethanol     Status: None   Collection Time: 03/06/22  9:11 PM  Result Value Ref Range   Alcohol, Ethyl (B) <10 <10 mg/dL    Comment: (NOTE) Lowest detectable limit for serum alcohol is 10 mg/dL.  For medical purposes only. Performed at [facility], [place] [address] [place], [place] [address]   Salicylate level     Status: Abnormal   Collection Time: 03/06/22  9:11 PM  Result Value Ref Range   Salicylate Lvl <3.9 (L) 7.0 - 30.0 mg/dL    Comment: Performed at [facility], [address] [place], [place] [address]  Acetaminophen level     Status: Abnormal   Collection Time: 03/06/22  9:11 PM  Result Value Ref Range   Acetaminophen (Tylenol), Serum <10 (L) 10 - 30 ug/mL    Comment: (NOTE) Therapeutic concentrations vary significantly. A range of 10-30 ug/mL  may be an effective concentration for many patients. However, some  are best treated at concentrations outside of this  range. Acetaminophen concentrations >150 ug/mL at 4 hours after ingestion  and >50 ug/mL at 12 hours after ingestion are often associated with  toxic reactions.  Performed at [facility], [address] [place], [place] [address]   Resp Panel by RT-PCR (Flu A&B, Covid) Anterior Nasal Swab     Status: None   Collection Time: 03/07/22  2:13 AM   Specimen: Anterior Nasal Swab  Result Value Ref Range   SARS Coronavirus 2 by RT PCR NEGATIVE NEGATIVE    Comment: (NOTE) SARS-CoV-2 target nucleic acids are NOT DETECTED.  The SARS-CoV-2 RNA is generally detectable in upper respiratory specimens during the acute phase of infection. The lowest concentration of SARS-CoV-2 viral copies this assay can detect is 138 copies/mL. A negative result does not preclude SARS-Cov-2 infection and should not be used as the sole basis for treatment or other patient management decisions. A negative result may occur with  improper specimen collection/handling, submission of specimen other than nasopharyngeal swab, presence of viral mutation(s) within the areas targeted by this assay, and inadequate number of viral copies(<138 copies/mL). A negative result must be combined with clinical observations, patient history, and epidemiological information. The expected result is Negative.  Fact Sheet for Patients:  BloggerCourse.com  Fact Sheet for Healthcare Providers:  SeriousBroker.it  This test is no t yet approved or cleared by the [place] FDA and  has been authorized for detection and/or diagnosis of SARS-CoV-2 by FDA under an Emergency Use Authorization (EUA). This EUA will remain  in effect (meaning this test can be used) for the duration of the COVID-19 declaration under Section 564(b)(1) of the Act, 21 U.S.C.section 360bbb-3(b)(1), unless the authorization is terminated  or revoked sooner.       Influenza A by PCR NEGATIVE NEGATIVE    Influenza B by PCR NEGATIVE NEGATIVE    Comment: (NOTE) The Xpert Xpress SARS-CoV-2/FLU/RSV plus assay is intended as an aid in the diagnosis of influenza from Nasopharyngeal swab specimens and should not be used as a sole basis for treatment. Nasal washings and aspirates are unacceptable for Xpert Xpress SARS-CoV-2/FLU/RSV testing.  Fact Sheet for Patients: BloggerCourse.com  Fact Sheet for Healthcare Providers: SeriousBroker.it  This test is not yet approved or cleared by the [place] FDA and has been authorized for detection and/or diagnosis of SARS-CoV-2 by FDA under an Emergency Use Authorization (EUA). This EUA will remain in effect (meaning this test can be used) for the duration of the COVID-19 declaration under Section 564(b)(1) of the Act, 21 U.S.C. section 360bbb-3(b)(1), unless the authorization is terminated or revoked.  Performed at [facility], [address] [place], [place] [address]   Rapid urine drug screen (hospital performed)     Status: Abnormal   Collection Time: 03/07/22 12:22 PM  Result Value Ref Range   Opiates NONE DETECTED NONE DETECTED   Cocaine POSITIVE (A) NONE DETECTED   Benzodiazepines NONE DETECTED NONE DETECTED   Amphetamines NONE DETECTED NONE DETECTED   Tetrahydrocannabinol NONE DETECTED NONE DETECTED   Barbiturates NONE DETECTED NONE DETECTED  Comment: (NOTE) DRUG SCREEN FOR MEDICAL PURPOSES ONLY.  IF CONFIRMATION IS NEEDED FOR ANY PURPOSE, NOTIFY LAB WITHIN 5 DAYS.  LOWEST DETECTABLE LIMITS FOR URINE DRUG SCREEN Drug Class                     Cutoff (ng/mL) Amphetamine and metabolites    1000 Barbiturate and metabolites    200 Benzodiazepine                 200 Opiates and metabolites        300 Cocaine and metabolites        300 THC                            50 Performed at [facility], [address], [place], [place] [address]     Blood Alcohol level:   Lab Results  Component Value Date   [facility] <10 03/06/2022   ETH <10 03/04/2022    Metabolic Disorder Labs: Lab Results  Component Value Date   HGBA1C 14.0 (H) 06/27/2019   MPG 355.1 06/27/2019    Physical Findings:  Musculoskeletal: Strength & Muscle Tone: within normal limits Gait & Station: normal Patient leans: N/A  Psychiatric Specialty Exam:  Presentation  General Appearance:  Disheveled  Eye Contact: Fair  Speech: Pressured  Speech Volume: Increased  Handedness: Right   Mood and Affect  Mood: Labile; Angry; Anxious  Affect: Blunt; Inappropriate; Labile   Thought Process  Thought Processes: Disorganized  Descriptions of Associations:Loose  Orientation:Full (Time, Place and Person)  Thought Content:Illogical  History of Schizophrenia/Schizoaffective disorder:No data recorded Duration of Psychotic Symptoms:No data recorded Hallucinations:Hallucinations: None  Ideas of Reference:None  Suicidal Thoughts:Suicidal Thoughts: No  Homicidal Thoughts:Homicidal Thoughts: No   Sensorium  Memory: Immediate Fair; Recent Fair; Remote Fair  Judgment: Poor  Insight: Lacking   Executive Functions  Concentration: Poor  Attention Span: Poor  Recall: Poor  Fund of Knowledge: Poor  Language: Fair   Psychomotor Activity  Psychomotor Activity: Psychomotor Activity: Normal   Assets  Assets: Communication Skills; Resilience   Sleep  Sleep: Sleep: Poor Number of Hours of Sleep: 0 (unable to determine due to patient's mental state)    Physical Exam: Physical Exam HENT:     Head: Normocephalic and atraumatic.  Eyes:     Extraocular Movements: Extraocular movements intact.     Pupils: Pupils are equal, round, and reactive to light.  Cardiovascular:     Rate and Rhythm: Normal rate.  Pulmonary:     Effort: Pulmonary effort is normal.  Skin:    Capillary Refill: Capillary refill takes less than 2 seconds.  Neurological:      General: No focal deficit present.     Mental Status: She is alert.    Review of Systems  Unable to perform ROS: Mental acuity   Blood pressure (!) 172/90, pulse 81, temperature 97.9 F (36.6 C), temperature source Oral, resp. rate 18, SpO2 97 %. There is no height or weight on file to calculate BMI.   Treatment Plan Summary: Plan : Given patient has lacks the mental capacity to make decisions concerning her medical care and procedures, patient is currently being admitted to the medical floor for further evaluation and management of gangrene he is right foot.  Reviewed recent labs, of note patient has not had a TSH ordered and has prominent protruding eyes concern for possible underlying thyroid disease which may also be  contributing to psychosis.  TSH ordered and pending. Patient continues to meet inpatient criteria for psychiatric inpatient treatment once medically cleared. Once patient has been transferred to medically, Psych Consult should be ordered to continue oversight of psychiatric conditions and psychotropic medication management.  Agitation continue as needed Ativan 2 mg IM every 6 hours. Schizoaffective disorder with mania and Psychosis -continue to offer Abilify and Cogentin. For severe agitation and aggression Geodon 20 mg IM every 12 hours  Joaquin Courts, FNP-C, PMHNP-BC 03/08/2022, 12:29 PM

## 2022-03-08 NOTE — ED Notes (Signed)
Pt awake and shouting in hall .

## 2022-03-08 NOTE — ED Notes (Signed)
Pt agitated walking in hall. Pt cursing at staff . Pt went to Bathroom and defecated on floor . Pt was given wash cloth and soap to clean up after defecating on floor. Pt was given blue scrubs because Purple scrubs .

## 2022-03-08 NOTE — Assessment & Plan Note (Signed)
This is a chronic issue that has been noted on prior ED visits with similar appearance on previous photos.  She is hemodynamically stable and without leukocytosis.  X-rays showed changes concerning for gas-forming infection at the tip of the right second toe but otherwise no sign of active systemic infection.  Pedal pulses are easily palpable.  Patient refused further work-up with MRI and also stated that she would not want any type of surgical intervention or amputation if felt necessary.  She has been refusing medication and is not adherent to her diabetes or hypertension meds as well.  EDP spoke with podiatry who recommended broad-spectrum antibiotics with vancomycin, cefepime, Flagyl. -This is a chronic issue with limited management options due to patient cooperation and preference -Obtain MRI of feet if patient is willing -She has been started on broad-spectrum IV antibiotics, I think this can likely be de-escalated to oral antibiotics if she is willing to take them and can be managed on an outpatient basis especially if she remains unwilling to proceed with further work-up or surgical intervention

## 2022-03-09 DIAGNOSIS — R451 Restlessness and agitation: Secondary | ICD-10-CM

## 2022-03-09 DIAGNOSIS — F1410 Cocaine abuse, uncomplicated: Secondary | ICD-10-CM

## 2022-03-09 DIAGNOSIS — I96 Gangrene, not elsewhere classified: Secondary | ICD-10-CM

## 2022-03-09 DIAGNOSIS — Z5900 Homelessness unspecified: Secondary | ICD-10-CM | POA: Diagnosis not present

## 2022-03-09 DIAGNOSIS — F14259 Cocaine dependence with cocaine-induced psychotic disorder, unspecified: Secondary | ICD-10-CM | POA: Diagnosis present

## 2022-03-09 DIAGNOSIS — Z781 Physical restraint status: Secondary | ICD-10-CM | POA: Diagnosis not present

## 2022-03-09 DIAGNOSIS — F4310 Post-traumatic stress disorder, unspecified: Secondary | ICD-10-CM | POA: Diagnosis present

## 2022-03-09 DIAGNOSIS — Z7984 Long term (current) use of oral hypoglycemic drugs: Secondary | ICD-10-CM | POA: Diagnosis not present

## 2022-03-09 DIAGNOSIS — Z91199 Patient's noncompliance with other medical treatment and regimen due to unspecified reason: Secondary | ICD-10-CM | POA: Diagnosis not present

## 2022-03-09 DIAGNOSIS — I10 Essential (primary) hypertension: Secondary | ICD-10-CM | POA: Diagnosis present

## 2022-03-09 DIAGNOSIS — E1165 Type 2 diabetes mellitus with hyperglycemia: Secondary | ICD-10-CM | POA: Diagnosis present

## 2022-03-09 DIAGNOSIS — Z882 Allergy status to sulfonamides status: Secondary | ICD-10-CM | POA: Diagnosis not present

## 2022-03-09 DIAGNOSIS — Z1152 Encounter for screening for COVID-19: Secondary | ICD-10-CM | POA: Diagnosis not present

## 2022-03-09 DIAGNOSIS — Z91040 Latex allergy status: Secondary | ICD-10-CM | POA: Diagnosis not present

## 2022-03-09 DIAGNOSIS — Z79899 Other long term (current) drug therapy: Secondary | ICD-10-CM | POA: Diagnosis not present

## 2022-03-09 DIAGNOSIS — Z046 Encounter for general psychiatric examination, requested by authority: Secondary | ICD-10-CM | POA: Diagnosis not present

## 2022-03-09 DIAGNOSIS — F14250 Cocaine dependence with cocaine-induced psychotic disorder with delusions: Secondary | ICD-10-CM | POA: Diagnosis not present

## 2022-03-09 DIAGNOSIS — F17210 Nicotine dependence, cigarettes, uncomplicated: Secondary | ICD-10-CM | POA: Diagnosis present

## 2022-03-09 DIAGNOSIS — F1420 Cocaine dependence, uncomplicated: Secondary | ICD-10-CM | POA: Diagnosis not present

## 2022-03-09 DIAGNOSIS — F250 Schizoaffective disorder, bipolar type: Secondary | ICD-10-CM | POA: Diagnosis present

## 2022-03-09 DIAGNOSIS — F1994 Other psychoactive substance use, unspecified with psychoactive substance-induced mood disorder: Secondary | ICD-10-CM | POA: Diagnosis not present

## 2022-03-09 DIAGNOSIS — E1152 Type 2 diabetes mellitus with diabetic peripheral angiopathy with gangrene: Secondary | ICD-10-CM | POA: Diagnosis present

## 2022-03-09 DIAGNOSIS — F29 Unspecified psychosis not due to a substance or known physiological condition: Secondary | ICD-10-CM | POA: Diagnosis not present

## 2022-03-09 DIAGNOSIS — K219 Gastro-esophageal reflux disease without esophagitis: Secondary | ICD-10-CM | POA: Diagnosis present

## 2022-03-09 LAB — CBC
HCT: 33.9 % — ABNORMAL LOW (ref 36.0–46.0)
Hemoglobin: 11.1 g/dL — ABNORMAL LOW (ref 12.0–15.0)
MCH: 28.2 pg (ref 26.0–34.0)
MCHC: 32.7 g/dL (ref 30.0–36.0)
MCV: 86.3 fL (ref 80.0–100.0)
Platelets: 267 10*3/uL (ref 150–400)
RBC: 3.93 MIL/uL (ref 3.87–5.11)
RDW: 12.9 % (ref 11.5–15.5)
WBC: 4.9 10*3/uL (ref 4.0–10.5)
nRBC: 0 % (ref 0.0–0.2)

## 2022-03-09 LAB — BASIC METABOLIC PANEL
Anion gap: 8 (ref 5–15)
BUN: 16 mg/dL (ref 6–20)
CO2: 26 mmol/L (ref 22–32)
Calcium: 9.2 mg/dL (ref 8.9–10.3)
Chloride: 100 mmol/L (ref 98–111)
Creatinine, Ser: 0.98 mg/dL (ref 0.44–1.00)
GFR, Estimated: >60 mL/min (ref >60)
Glucose, Bld: 363 mg/dL — ABNORMAL HIGH (ref 70–99)
Potassium: 4.4 mmol/L (ref 3.5–5.1)
Sodium: 134 mmol/L — ABNORMAL LOW (ref 135–145)

## 2022-03-09 LAB — GLUCOSE, CAPILLARY
Glucose-Capillary: 337 mg/dL — ABNORMAL HIGH (ref 70–99)
Glucose-Capillary: 339 mg/dL — ABNORMAL HIGH (ref 70–99)

## 2022-03-09 MED ORDER — STERILE WATER FOR INJECTION IJ SOLN
INTRAMUSCULAR | Status: AC
  Filled 2022-03-09: qty 10

## 2022-03-09 MED ORDER — LORAZEPAM 2 MG/ML IJ SOLN: 2.0000 mg | Freq: Four times a day (QID) | INTRAMUSCULAR | Status: DC | PRN

## 2022-03-09 MED ORDER — LORAZEPAM 2 MG/ML IJ SOLN
2.0000 mg | Freq: Two times a day (BID) | INTRAMUSCULAR | Status: DC
  Administered 2022-03-09 – 2022-03-11 (×4): 2 mg via INTRAMUSCULAR
  Filled 2022-03-09 (×5): qty 1

## 2022-03-09 MED ORDER — LORAZEPAM 1 MG PO TABS: 1.0000 mg | ORAL_TABLET | Freq: Four times a day (QID) | ORAL | Status: DC

## 2022-03-09 MED ORDER — DIPHENHYDRAMINE HCL 50 MG/ML IJ SOLN: 50.0000 mg | Freq: Four times a day (QID) | INTRAMUSCULAR | Status: DC | PRN

## 2022-03-09 MED ORDER — OLANZAPINE 5 MG PO TBDP: 15.0000 mg | ORAL_TABLET | Freq: Two times a day (BID) | ORAL | Status: DC

## 2022-03-09 MED ORDER — LIVING WELL WITH DIABETES BOOK
Freq: Once | Status: DC
  Filled 2022-03-09: qty 1

## 2022-03-09 MED ORDER — HALOPERIDOL LACTATE 5 MG/ML IJ SOLN
5.0000 mg | Freq: Four times a day (QID) | INTRAMUSCULAR | Status: DC | PRN
  Administered 2022-03-09 – 2022-03-11 (×3): 5 mg via INTRAMUSCULAR
  Filled 2022-03-09 (×3): qty 1

## 2022-03-09 MED ORDER — LORAZEPAM 2 MG/ML IJ SOLN
2.0000 mg | Freq: Four times a day (QID) | INTRAMUSCULAR | Status: DC | PRN
  Administered 2022-03-09: 2 mg via INTRAVENOUS
  Filled 2022-03-09 (×2): qty 1

## 2022-03-09 MED ORDER — DIPHENHYDRAMINE HCL 50 MG/ML IJ SOLN
50.0000 mg | Freq: Four times a day (QID) | INTRAMUSCULAR | Status: DC | PRN
  Administered 2022-03-09 – 2022-03-11 (×3): 50 mg via INTRAMUSCULAR
  Filled 2022-03-09 (×3): qty 1

## 2022-03-09 MED ORDER — OLANZAPINE 10 MG IM SOLR
10.0000 mg | Freq: Two times a day (BID) | INTRAMUSCULAR | Status: DC
  Administered 2022-03-09 – 2022-03-11 (×4): 10 mg via INTRAMUSCULAR
  Filled 2022-03-09 (×16): qty 10

## 2022-03-09 MED ORDER — LORAZEPAM 2 MG/ML IJ SOLN
2.0000 mg | Freq: Four times a day (QID) | INTRAMUSCULAR | Status: DC | PRN
  Administered 2022-03-09 – 2022-03-11 (×3): 2 mg via INTRAMUSCULAR
  Filled 2022-03-09 (×4): qty 1

## 2022-03-09 MED ORDER — OLANZAPINE 5 MG PO TBDP
10.0000 mg | ORAL_TABLET | Freq: Two times a day (BID) | ORAL | Status: DC
  Administered 2022-03-10 – 2022-03-16 (×10): 10 mg via ORAL
  Filled 2022-03-09 (×10): qty 2

## 2022-03-09 MED ORDER — OLANZAPINE 10 MG IM SOLR: 15.0000 mg | Freq: Two times a day (BID) | INTRAMUSCULAR | Status: DC

## 2022-03-09 MED ORDER — LORAZEPAM 1 MG PO TABS
2.0000 mg | ORAL_TABLET | Freq: Two times a day (BID) | ORAL | Status: DC
  Administered 2022-03-10 – 2022-03-14 (×6): 2 mg via ORAL
  Filled 2022-03-09 (×6): qty 2

## 2022-03-09 NOTE — Progress Notes (Signed)
Patient stripped off gown and removed IV access. Ran to elevator, despite staff attempts to redirect her. Screaming obscenities and spitting at staff. Security was called. This RN attempted to hold elevator to prevent patient from further escalation in the lobby. Patient threatened to break RN's nose, etc. After repeated physical advances by patient and confirmation that security was aware of the situation, elevator allowed to leave and patient ended up on 1st floor. Security was able to detain patient at this time. Gown put on patient and mask placed for staff safety. Patient returned to floor, given IM geodon and placed in restraints. MD notified. Patient still verbally aggressive and threatening, despite medication administration.

## 2022-03-09 NOTE — Progress Notes (Signed)
[facility] Second Physician Opinion Progress Note for Medication Administration to Non-consenting Patients (For Involuntarily Committed Patients)  Patient: Katherine Franklin Date of Birth: [birth year] MRN: [id number]  Reason for the Medication: The patient, without the benefit of the specific treatment measure, is incapable of participating in any available treatment plan that will give the patient a realistic opportunity of improving the patient's condition. There is, without the benefit of the specific treatment measure, a significant possibility that the patient will harm self or others before improvement of the patient's condition is realized.  Consideration of Side Effects: Consideration of the side effects related to the medication plan has been given.  Rationale for Medication Administration: Patient is acutely psychotic and disoriented.  She is agitated and aggressive towards staff. At this time, she does not have capacity for medical decision making.  Will plan for forced medication orders for scheduled Zyprexa and Ativan IM if patient refuses PO.  Forced medication order in place until patient psychiatrically stabilized. Psychiatry will continue to follow.   Lavella Hammock, MD 03/09/22  11:58 AM   This documentation is good for (7) seven days from the date of the MD signature. New documentation must be completed every seven (7) days with detailed justification in the medical record if the patient requires continued non-emergent administration of psychotropic medications.

## 2022-03-09 NOTE — Inpatient Diabetes Management (Signed)
Inpatient Diabetes Program Recommendations  AACE/ADA: New Consensus Statement on Inpatient Glycemic Control (2015)  Target Ranges:  Prepandial:   less than 140 mg/dL      Peak postprandial:   less than 180 mg/dL (1-2 hours)      Critically ill patients:  140 - 180 mg/dL   Lab Results  Component Value Date   GLUCAP 160 (H) 03/08/2022   HGBA1C 11.3 (H) 03/08/2022    Review of Glycemic Control  Latest Reference Range & Units 03/09/22 04:59  Glucose 70 - 99 mg/dL 363 (H)  (H): Data is abnormally high  Diabetes history: DM2 Outpatient Diabetes medications: Metformin 2000 mg QAM (not taking) Current orders for Inpatient glycemic control: Novolog 0-9 units TID   Inpatient Diabetes Program Recommendations:    Semglee 22 units QD (0.2 units x 108.9 kg)  Ordered Living Well with Diabetes booklet.  Will attempt to speak with her today regarding A1C.  Will continue to follow while inpatient.  Thank you, Reche Dixon, MSN, [place] Diabetes Coordinator Inpatient Diabetes Program [phone] (team pager from 8a-5p)

## 2022-03-09 NOTE — Progress Notes (Signed)
Patient has received breakfast tray, and refused blood sugar check "I don't want nobody to mess with me right now"

## 2022-03-09 NOTE — Progress Notes (Signed)
Throughout the majority of night, pt was amenable to care provided and appeared to rest comfortably. However, at approximately 0525 pt became agitated, stripping all of her clothing off, removing her telemetry, and demanding her IV be removed. This RN attempted redirection/reorientation,explaining to pt the need for cardiac monitoring as well as IV access. Pt belligerent and security called, but with the assistance of other floor staff, pt calmed down and allowed replacement of tele and gown. IV tubing and pump removed from room but catheter remains intact and wrapped on pt's forearm. Security came to floor, but ultimately their assistance was not required. Will continue to monitor closely.

## 2022-03-09 NOTE — Consult Note (Addendum)
[facility] Psychiatry New Face-to-Face Psychiatric Evaluation   Service Date: March 09, 2022 LOS:  LOS: 0 days    Assessment  Katherine Franklin is a 56 y.o. female admitted medically for 03/06/2022 10:48 PM for psychotic behavior, brought in by EMS and found to have concern for gas forming infection in bilateral lower extremities. She carries the psychiatric diagnoses of schizoaffective disorder, cocaine abuse, PTSD and has a past medical history of  hypertension and uncontrolled diabetes.Psychiatry was consulted for "PSYCHOSIS, AGGRESSION, AND AGITATION BEING MEDICALLY ADMITTED" by [facility].    Her current presentation of impulsivity, lability, agitation, elopement is most consistent with psychosis secondary to schizoaffective disorder vs substance induced psychotic disorder. She is under IVC and will require inpatient psychiatric admission based on initial assessment given her active risk of harm to self and others. Not on any psychotropic medications.  Given her refusal to take medications and lack of realistic opportunity for improvement without medication, she will be put on forced medication protocol.  Please see plan below for detailed recommendations.   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 This decision is based on my review of the chart including patient's history and current presentation, interview of the patient, mental status examination, and consideration of suicide risk including evaluating suicidal ideation, plan, intent, suicidal or  self-harm behaviors, risk factors, and protective factors. This judgment is based on our ability to directly address suicide risk, implement suicide prevention strategies and develop a safety plan while the patient is in the clinical setting. Please contact our team if there is a concern that risk level has changed. - Requires non-violent restraint at this time due to elopement risk   ##Schizoaffective Disorder ##Substance induced Psychotic Disorder ##Cocaine Use Disorder --Discontinue Abilify 10mg   FORCED MEDICATION PROTOCOL (initiated 03/09/22) --START Zyprexa 10 mg BID PO or IM --START Ativan 2 mg BID PO or IM  --Daily ECG to check for Qtc prolongation  --Requires renewal every 7 days (next due: 03/16/22)  --Agitation Protocol: Haldol 5 mg + Benadryl 50 mg + Ativan 2 mg q6h   ## Medical Decision Making Capacity:  Not formally assessed today  ## Further Work-up:  -- defer to primary regarding dry gangrene and infectious workup  -- most recent EKG on 03/07/22 had QtC of 439 -- Pertinent labwork reviewed earlier this admission includes:   -HGB 10.9  -BG 338  -UDS pending from this hospitalization   - TSH 0.19  -A1c 11.3  ## Disposition:  -- Inpatient psychiatry once medically stable  ## Behavioral / Environmental:  -- Patient has been aggressive with staff and attempted to elope multiple times (see separate nursing note for further details)  ##Legal Status --Under IVC  Thank you for this consult request. Recommendations have been communicated to the primary team.  We will follow at this time.   03/09/22, MD   NEW history  Relevant Aspects of Hospital Course:  Admitted on 03/06/2022 for psychotic behavior brought in by EMS, found to have bilateral lower extremities infection  in the setting of uncontrolled diabetes.  Patient Report:  Per chart review, the patient was also seen at the ED 10/4 (hyperglycemia), 10/7 (HI and detox request), 10/11 (hyperglycemia,  homelessness), 10/14 (concern for drug overdose), 10/16 (voluntary transfer to [facility], discharged with resources, not eligible for [facility] for insurance), 10/20 (requesting BG check), 10/22 (agitated behavior), 10/23 (agitated behavior),  10/24 (eating feces).  Was called by RN to see patient as she had attempted to elope. She was just given geodon 20 mg and ativan 2 mg <1 hour prior to elopement.  Patient on evaluation was in 4 point non-violent restraints, shouting and disorganized. Per RN, patient had thought that if she was released from restraints, she would be able to leave. Patient perseverative that she can leave after her toe is amputated, so she asked for a pair of scissors to amputate toes herself. Patient continues to shout and attempts to use her teeth to pull off soft restraints. Difficult to redirect. She did appear slightly more somnolent by end of brief assessment.  ROS:  Unable to assess due to lability   Psychiatric History:  Information collected from chart: Multiple ED visits, hx of schizoaffective disorder and cocaine use disorder   Social History:  Tobacco use: unknown Alcohol use: unknown Drug use: UDS positive for cocaine last ED visit  Family History:  The patient's Family history is unknown by patient.  Medical History: Past Medical History:  Diagnosis Date   Diabetes mellitus without complication ([place])    Drug abuse ([place]) 09/22/2018   GERD (gastroesophageal reflux disease)    Hypertension    PTSD (post-traumatic stress disorder)    Schizo affective schizophrenia ([place])     Surgical History: Past Surgical History:  Procedure Laterality Date   SKIN GRAFT     TUBAL LIGATION      Medications:   Current Facility-Administered Medications:    acetaminophen (TYLENOL) tablet 650 mg, 650 mg, Oral, Q6H PRN **OR** acetaminophen (TYLENOL) suppository 650 mg, 650 mg, Rectal, Q6H PRN, Orma Flaming, MD   ceFEPIme (MAXIPIME) 2 g in sodium chloride 0.9 % 100 mL  IVPB, 2 g, Intravenous, Q8H, Rumbarger, Valeda Malm, RPH, Stopped at 03/09/22 0054   haloperidol lactate (HALDOL) injection 5 mg, 5 mg, Intramuscular, Q6H PRN **AND** diphenhydrAMINE (BENADRYL) injection 50 mg, 50 mg, Intramuscular, Q6H PRN **AND** LORazepam (ATIVAN) injection 2 mg, 2 mg, Intramuscular, Q6H PRN, [name], MD   insulin aspart (novoLOG) injection 0-9 Units, 0-9 Units, Subcutaneous, TID WC, Orma Flaming, MD, 7 Units at 03/08/22 1811   living well with diabetes book MISC, , Does not apply, Once, Nita Sells, MD   LORazepam (ATIVAN) tablet 2 mg, 2 mg, Oral, BID **OR** LORazepam (ATIVAN) injection 2 mg, 2 mg, Intramuscular, BID, [name], MD   metroNIDAZOLE (FLAGYL) IVPB 500 mg, 500 mg, Intravenous, Q12H, Orma Flaming, MD, Last Rate: 100 mL/hr at 03/09/22 1203, 500 mg at 03/09/22 1203   OLANZapine zydis (ZYPREXA) disintegrating tablet 10 mg, 10 mg, Oral, BID **OR** OLANZapine (ZYPREXA) injection 10 mg, 10 mg, Intramuscular, BID, [name], MD   sterile water (preservative free) injection, , , ,    vancomycin (VANCOREADY) IVPB 750 mg/150 mL, 750 mg, Intravenous, Q12H, Rumbarger, Valeda Malm, RPH, Last Rate: 150 mL/hr at 03/09/22 1202, 750 mg at 03/09/22 1202  Allergies: Allergies  Allergen Reactions   Sulfa Antibiotics Shortness Of Breath, Rash and Swelling   Latex Rash       Objective  Vital signs:  Temp:  [97.8 F (36.6  C)-98.7 F (37.1 C)] 98.7 F (37.1 C) (10/27 0433) Pulse Rate:  [75-91] 86 (10/27 0433) Resp:  [16-20] 18 (10/27 0433) BP: (150-170)/(80-89) 150/80 (10/27 0433) SpO2:  [96 %-100 %] 96 % (10/27 0433)  Psychiatric Specialty Exam:  Presentation  General Appearance:  Disheveled  Eye Contact: None  Speech: Pressured  Speech Volume: Increased  Handedness: Right   Mood and Affect  Mood: Angry; Labile  Affect: Labile   Thought Process  Thought Processes: Disorganized  Descriptions of Associations:Tangential  Orientation:--  (Unable to assess due to lability)  Thought Content:-- (Unable to assess due to lability)  History of Schizophrenia/Schizoaffective disorder:No data recorded Reported in chart review Duration of Psychotic Symptoms:No data recorded >6 months Hallucinations:Hallucinations: -- (Unable to assess due to lability)  Ideas of Reference:-- (Unable to assess due to lability)  Suicidal Thoughts:Suicidal Thoughts: -- (Unable to assess due to lability)  Homicidal Thoughts:Homicidal Thoughts: -- (Unable to assess due to lability)   Sensorium  Memory: -- (Unable to assess due to lability)  Judgment: Impaired  Insight: None   Executive Functions  Concentration: Poor  Attention Span: Poor  Recall: Poor  Fund of Knowledge: Poor  Language: Poor   Psychomotor Activity  Psychomotor Activity: Psychomotor Activity: Increased   Assets  Assets: Communication Skills; Resilience   Sleep  Sleep: Sleep: Poor Number of Hours of Sleep: 0 (unable to determine due to patient's mental state)    Physical Exam: Physical Exam Constitutional:      General: She is in acute distress.     Appearance: She is obese. She is ill-appearing.  HENT:     Head: Normocephalic and atraumatic.  Cardiovascular:     Rate and Rhythm: Normal rate.  Pulmonary:     Effort: No respiratory distress.  Musculoskeletal:        General: Normal range of motion.  Skin:    Comments: Scarring on bilateral thighs with evidence of graft on right thigh  Neurological:     Mental Status: She is disoriented.    ROS Blood pressure (!) 150/80, pulse 86, temperature 98.7 F (37.1 C), temperature source Oral, resp. rate 18, height 5\' 9"  (1.753 m), SpO2 96 %. Body mass index is 35.44 kg/m.
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
## 2022-03-09 NOTE — Consult Note (Signed)
PODIATRY CONSULTATION  NAME Katherine Franklin MRN [id number] DOB 29-Mar-1966 DOA 03/06/2022   Reason for consult:  Chief Complaint  Patient presents with   Mental Health Problem    Consulting physician: Edd Fabian, MD  History of present illness: 56 y.o. female PMHx she is affective disorder, cocaine abuse, T2DM, HTN admitted for agitation and delusions.  History limited due to patient's psychosis and noncompliance.  Found to have dry gangrene of the right second toe.  Podiatry consulted.   Upon presentation patient immediately states that she does not want surgery.  She did allow me to evaluate her toes and feet however.  Past Medical History:  Diagnosis Date   Diabetes mellitus without complication ([place])    Drug abuse ([place]) 09/22/2018   GERD (gastroesophageal reflux disease)    Hypertension    PTSD (post-traumatic stress disorder)    Schizo affective schizophrenia ([place])        Latest Ref Rng & Units 03/09/2022    4:59 AM 03/08/2022    7:23 PM 03/06/2022    9:11 PM  CBC  WBC 4.0 - 10.5 K/uL 4.9  5.3  6.1   Hemoglobin 12.0 - 15.0 g/dL 11.1  11.3  10.9   Hematocrit 36.0 - 46.0 % 33.9  34.4  34.6   Platelets 150 - 400 K/uL 267  273  288        Latest Ref Rng & Units 03/09/2022    4:59 AM 03/08/2022    7:23 PM 03/06/2022    9:11 PM  BMP  Glucose 70 - 99 mg/dL 363  272  338   BUN 6 - 20 mg/dL 16  14  22    Creatinine 0.44 - 1.00 mg/dL 0.98  0.86  0.98   Sodium 135 - 145 mmol/L 134  137  137   Potassium 3.5 - 5.1 mmol/L 4.4  3.9  3.8   Chloride 98 - 111 mmol/L 100  103  101   CO2 22 - 32 mmol/L 26  26  23    Calcium 8.9 - 10.3 mg/dL 9.2  9.4  9.3      Physical Exam: General: The patient is alert and oriented x3 in no acute distress.   Dermatology: Hyperkeratotic callus tissue noted to the distal tips of the toes bilateral most prevalent to the right second toe.  No malodor.  No fluctuance.  No drainage.  There is no wound noted on any of the digits with  exception of the right second toe.  The wound to the right second toe appears very dry and stable.  No exposed bone.  Vascular: Skin warm to touch. No edema or erythema noted. Capillary refill within normal limits.  Neurological: grossly intact via light touch bilaterally.   Musculoskeletal Exam: No structural deformity noted.  No prior amputations noted    ASSESSMENT/PLAN OF CARE Ischemic dry stable gangrene lesser digits bilateral toes. Most concerning RT second toe -Patient refuses surgery or any additional work-up regarding the toes -The toe, specifically the right second toe most concerning, appears very dry and stable.  No fluctuance or drainage.  X-rays reviewed by myself and 'air loculations' clinically correlate to multiple layers of hyperkeratotic skin and tissue around the distal tuft of the toe.  This does not correlate with any gas gangrene or acute infection -Recommend Betadine to the distal tips of the toes daily -Recommend follow-up in office postdischarge. Podiatry to sign off.      Thank you for the consult.  Please contact me  via secure chat with any questions or concerns.    Felecia Shelling, DPM [facility]  Dr. Felecia Shelling, DPM    [address] [place], [place] [address]                Office [phone]  Fax [phone]
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
## 2022-03-09 NOTE — Progress Notes (Signed)
Pt had a BM in the bed then wiped feces on the bed rail. RN and Probation officer cleaned pt up and changed all bed linens. Pt stated "this would not have happened if you hadn't tied me up" and "why wont you let me out. Earlie Server are stuck in the past. I am looking to the future. Yall cant let go of the past and that is why you are going to hell" pt reeducated that if she can show compliance and safe behavior then she can be released from restraints but thus far she has continued to be verbally aggressive towards staff and continued to spit at staff and make threats.

## 2022-03-09 NOTE — Progress Notes (Signed)
IV Nurse at bedside to initiate new IV access. Pt stated that she had to pee. Writer obtained a Purewick and a bedpan for pt. Pt initially refused stating "since she was tied to the bed she was just gonna pee right thereNurse, mental health informed pt that if she peed in the bed, staff would not assist her in cleaning it up and that she would independently need to clean up her own urine since she was offered assistance with alternatives. Pt agreeable with purewick. Writer then applied PW.   Pt agreeable to allow IV RN to initiate IV access.   At this time pt still not agreeable to allowing staff to check CBG. Numerous attempts have been made and each time pt will start screaming and yelling at staff.
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
## 2022-03-10 DIAGNOSIS — I96 Gangrene, not elsewhere classified: Secondary | ICD-10-CM | POA: Diagnosis not present

## 2022-03-10 LAB — GLUCOSE, CAPILLARY
Glucose-Capillary: 215 mg/dL — ABNORMAL HIGH (ref 70–99)
Glucose-Capillary: 387 mg/dL — ABNORMAL HIGH (ref 70–99)
Glucose-Capillary: 308 mg/dL — ABNORMAL HIGH (ref 70–99)

## 2022-03-10 MED ORDER — INSULIN ASPART 100 UNIT/ML IJ SOLN
4.0000 [IU] | Freq: Once | INTRAMUSCULAR | Status: AC
  Administered 2022-03-10: 4 [IU] via SUBCUTANEOUS

## 2022-03-10 MED ORDER — AMOXICILLIN-POT CLAVULANATE 875-125 MG PO TABS
1.0000 | ORAL_TABLET | Freq: Two times a day (BID) | ORAL | Status: DC
  Administered 2022-03-10 – 2022-03-13 (×7): 1 via ORAL
  Filled 2022-03-10 (×7): qty 1

## 2022-03-10 NOTE — Consult Note (Signed)
[facility] New Face-to-Face Psychiatric Evaluation   Service Date: March 10, 2022 LOS:  LOS: 1 day    Assessment  Katherine Franklin is a 56 y.o. female admitted medically for 03/06/2022 10:48 PM for psychotic behavior, brought in by EMS and found to have concern for gas forming infection in bilateral lower extremities. She carries the psychiatric diagnoses of schizoaffective disorder, cocaine abuse, PTSD and has a past medical history of  hypertension and uncontrolled diabetes.Psychiatry was consulted for "PSYCHOSIS, AGGRESSION, AND AGITATION BEING MEDICALLY ADMITTED" by [facility].   Progress note: Patient is seen in her room, chart reviewed and treatment discussed with the team. Today, she a bit cooperative as evidenced by decreased impulsivity, lability, agitation and compliance with medications. However, patient still has psychosis, paranoia and disorganized thought process. She is under IVC and will benefit from inpatient psychiatric admission based on initial assessment given her active risk of harm to self and others.   Given her refusal to take medications and lack of realistic opportunity for improvement without medication, she will be put on forced medication protocol.  Please see plan below for detailed recommendations.   Diagnoses:  Active Hospital problems: Principal Problem:   Dry gangrene toes of bilateral feet (HCC) Active Problems:   Cocaine dependence (HCC)   Substance induced mood disorder (HCC)   Psychosis ([place])   Type 2 diabetes mellitus ([place])   Gangrene ([place])     Plan  ## Safety and Observation Level:  - Based on my clinical evaluation, I estimate the patient to be at high risk of self harm in the current setting - At this time, we recommend a 1:1 level of observation. This decision is based on my review of the chart including patient's history and current presentation, interview of the patient, mental status examination, and consideration of  suicide risk including evaluating suicidal ideation, plan, intent, suicidal or self-harm behaviors, risk factors, and protective factors. This judgment is based on our ability to directly address suicide risk, implement suicide prevention strategies and develop a safety plan while the patient is in the clinical setting. Please contact our team if there is a concern that risk level has changed. - Requires non-violent restraint at this time due to elopement risk   ##Schizoaffective Disorder ##Substance induced Psychotic Disorder ##Cocaine Use Disorder --Discontinue Abilify 10mg   FORCED MEDICATION PROTOCOL (initiated 03/09/22) --Continue Zyprexa 10 mg BID PO or IM --Continue  Ativan 2 mg BID PO or IM  --Daily ECG to check for Qtc prolongation  --Requires renewal every 7 days (next due: 03/16/22)  --Continue Agitation Protocol: Haldol 5 mg + Benadryl 50 mg + Ativan 2 mg q6h   ## Medical Decision Making Capacity:  Not formally assessed today  ## Further Work-up:  -- defer to primary regarding dry gangrene and infectious workup  -- most recent EKG on 03/07/22 had QtC of 439 -- Pertinent labwork reviewed earlier this admission includes:   -HGB 10.9  -BG 338  -UDS pending from this hospitalization   - TSH 0.19  -A1c 11.3  ## Disposition:  -- Inpatient psychiatry once medically stable  ## Behavioral / Environmental:  -- Patient has been aggressive with staff and attempted to elope multiple times (see separate nursing note for further details)  ##Legal Status --Under IVC  Thank you for this consult request. Recommendations have been communicated to the primary team.  We will follow at this time.   Corena Pilgrim, MD   NEW history  Relevant Aspects of Hospital Course:  Admitted on 03/06/2022 for psychotic behavior brought in by EMS, found to have bilateral lower extremities infection in the setting of uncontrolled diabetes.  Patient Report:  Per chart review, the patient was  also seen at the ED 10/4 (hyperglycemia), 10/7 (HI and detox request), 10/11 (hyperglycemia, homelessness), 10/14 (concern for drug overdose), 10/16 (voluntary transfer to [facility], discharged with resources, not eligible for [facility] for insurance), 10/20 (requesting BG check), 10/22 (agitated behavior), 10/23 (agitated behavior),  10/24 (eating feces).  Was called by RN to see patient as she had attempted to elope. She was just given geodon 20 mg and ativan 2 mg <1 hour prior to elopement.  Patient on evaluation was in 4 point non-violent restraints, shouting and disorganized. Per RN, patient had thought that if she was released from restraints, she would be able to leave. Patient perseverative that she can leave after her toe is amputated, so she asked for a pair of scissors to amputate toes herself. Patient continues to shout and attempts to use her teeth to pull off soft restraints. Difficult to redirect. She did appear slightly more somnolent by end of brief assessment.  ROS:  Unable to assess due to lability   Psychiatric History:  Information collected from chart: Multiple ED visits, hx of schizoaffective disorder and cocaine use disorder   Social History:  Tobacco use: unknown Alcohol use: unknown Drug use: UDS positive for cocaine last ED visit  Family History:  The patient's Family history is unknown by patient.  Medical History: Past Medical History:  Diagnosis Date   Diabetes mellitus without complication ([place])    Drug abuse ([place]) 09/22/2018   GERD (gastroesophageal reflux disease)    Hypertension    PTSD (post-traumatic stress disorder)    Schizo affective schizophrenia ([place])     Surgical History: Past Surgical History:  Procedure Laterality Date   SKIN GRAFT     TUBAL LIGATION      Medications:   Current Facility-Administered Medications:    acetaminophen (TYLENOL) tablet 650 mg, 650 mg, Oral, Q6H PRN **OR** acetaminophen (TYLENOL) suppository 650 mg, 650 mg,  Rectal, Q6H PRN, Orma Flaming, MD   amoxicillin-clavulanate (AUGMENTIN) 875-125 MG per tablet 1 tablet, 1 tablet, Oral, Q12H, Samtani, Jai-Gurmukh, MD, 1 tablet at 03/10/22 0925   haloperidol lactate (HALDOL) injection 5 mg, 5 mg, Intramuscular, Q6H PRN, 5 mg at 03/09/22 1755 **AND** diphenhydrAMINE (BENADRYL) injection 50 mg, 50 mg, Intramuscular, Q6H PRN, 50 mg at 03/09/22 1756 **AND** LORazepam (ATIVAN) injection 2 mg, 2 mg, Intramuscular, Q6H PRN, [name], MD, 2 mg at 03/09/22 1756   insulin aspart (novoLOG) injection 0-9 Units, 0-9 Units, Subcutaneous, TID WC, Orma Flaming, MD, 3 Units at 03/10/22 7782   living well with diabetes book MISC, , Does not apply, Once, Nita Sells, MD   LORazepam (ATIVAN) tablet 2 mg, 2 mg, Oral, BID, 2 mg at 03/10/22 4235 **OR** LORazepam (ATIVAN) injection 2 mg, 2 mg, Intramuscular, BID, [name], MD, 2 mg at 03/09/22 2026   OLANZapine zydis (ZYPREXA) disintegrating tablet 10 mg, 10 mg, Oral, BID, 10 mg at 03/10/22 0925 **OR** OLANZapine (ZYPREXA) injection 10 mg, 10 mg, Intramuscular, BID, [name], MD, 10 mg at 03/09/22 2215  Allergies: Allergies  Allergen Reactions   Sulfa Antibiotics Shortness Of Breath, Rash and Swelling   Latex Rash       Objective  Vital signs:  Temp:  [98.4 F (36.9 C)] 98.4 F (36.9 C) (10/28 0619) Pulse Rate:  [72] 72 (10/28 0619) Resp:  [19]  19 (10/28 0619) BP: (154)/(89) 154/89 (10/28 0619) SpO2:  [98 %] 98 % (10/28 0619)  Psychiatric Specialty Exam:  Presentation  General Appearance:  Disheveled  Eye Contact: None  Speech: Pressured  Speech Volume: Increased  Handedness: Right   Mood and Affect  Mood: Angry; Labile  Affect: Labile   Thought Process  Thought Processes: Disorganized  Descriptions of Associations:Tangential  Orientation:-- (Unable to assess due to lability)  Thought Content:-- (Unable to assess due to lability)  History of Schizophrenia/Schizoaffective  disorder:No data recorded Reported in chart review Duration of Psychotic Symptoms:No data recorded >6 months Hallucinations:Hallucinations: -- (Unable to assess due to lability)  Ideas of Reference:-- (Unable to assess due to lability)  Suicidal Thoughts:Suicidal Thoughts: -- (Unable to assess due to lability)  Homicidal Thoughts:Homicidal Thoughts: -- (Unable to assess due to lability)   Sensorium  Memory: -- (Unable to assess due to lability)  Judgment: Impaired  Insight: None   Executive Functions  Concentration: Poor  Attention Span: Poor  Recall: Poor  Fund of Knowledge: Poor  Language: Poor   Psychomotor Activity  Psychomotor Activity: Psychomotor Activity: Increased   Assets  Assets: Communication Skills; Resilience   Sleep  Sleep: Sleep: Poor    Physical Exam: Physical Exam Constitutional:      General: She is in acute distress.     Appearance: She is obese. She is ill-appearing.  HENT:     Head: Normocephalic and atraumatic.  Cardiovascular:     Rate and Rhythm: Normal rate.  Pulmonary:     Effort: No respiratory distress.  Musculoskeletal:        General: Normal range of motion.  Skin:    Comments: Scarring on bilateral thighs with evidence of graft on right thigh  Neurological:     Mental Status: She is disoriented.    ROS Blood pressure (!) 154/89, pulse 72, temperature 98.4 F (36.9 C), temperature source Oral, resp. rate 19, height 5\' 9"  (1.753 m), SpO2 98 %. Body mass index is 35.44 kg/m.

## 2022-03-10 NOTE — Progress Notes (Signed)
Patient states, "I am not suicidal and they said my feet were fine why are you holding me here?" Explained IVC to patient and requirement of psych clearance prior to discharge. Patient demands psych doctor see her now. Informed he saw her earlier on this date. Redirection provided, although patient resistant.

## 2022-03-10 NOTE — Progress Notes (Signed)
Patient had BM in multiple areas of the room and proceeded to throw feces covered towels at sitter. Acting belligerent and aggressive. PRN medications administered as ordered.

## 2022-03-10 NOTE — Plan of Care (Signed)
  Problem: Nutritional: Goal: Maintenance of adequate nutrition will improve Outcome: Progressing   Problem: Skin Integrity: Goal: Risk for impaired skin integrity will decrease Outcome: Progressing   Problem: Tissue Perfusion: Goal: Adequacy of tissue perfusion will improve Outcome: Progressing   Problem: Safety: Goal: Non-violent Restraint(s) Outcome: Progressing   Problem: Education: Goal: Knowledge of General Education information will improve Description: Including pain rating scale, medication(s)/side effects and non-pharmacologic comfort measures Outcome: Progressing

## 2022-03-10 NOTE — Progress Notes (Signed)
PROGRESS NOTE   Katherine Franklin  [id number] DOB: 01/17/1966 DOA: 03/06/2022 PCP: Patient, No Pcp Per  Brief Narrative:  56 year old black female prior schizoaffective disorder mood induced disorder--no meds in the past 5 years DM TY 2 HTN Several emergency room visits starting 01/2022--imaging 9/18 showed dry gangrene not requiring intervention several other episodes emergency room visits for hyperglycemia with a sugar in the 400s  Vacillating mood finally admitted to the hospital 10/24 after being found naked at the sit to go eating her own feces with agitation and aggression and delusions of persecution  Psychiatry has been consulted as she is remained belligerent aggressive and delusional and this seems more in keeping with some type of substance abuse  Hospital-Problem based course  Psychiatric concerns?  Substance abuse mood disorder Seems to have disorganized thoughts unrealistic expectation and seems quite delusional She is now out of restraints but still has episodes of belligerent behavior and throwing her own feces IVC per psychiatry renewal 11/3, one-to-one sitter, periodic EKG Agree with forced med protocol with Zydis, Ativan, Benadryl, lorazepam ATC Will require placement as per psychiatry  Persistent dry gangrene Tries to bargain about getting imaging and asks about discharge after imaging  Dr. Amalia Hailey of podiatry evaluated 10/27--no gas gangrene acute infection recommend better Dean to distal tips of toes and foot outpatient follow-up I have discontinued IV antibiotics and placed on Augmentin until 10/30-patient is medically stable from my perspective and can go to psych facility whenever psych can arrange  DM TY 2 on metformin at home Refusing meds, CBG checks intermittently CBG ranging 2 15-3 87 Nursing to attempt when patient willing  Uncontrolled HTN refusing care at times on lisinopril PTA-monitor  DVT prophylaxis: SCD Code Status: Presumed full Family  Communication: None present Disposition:  Status is: Inpatient Remains inpatient appropriate because:   Not appropriate for discharge.  IVC   Consultants:  Podiatry Psychiatry  Procedures: None yet  Antimicrobials: As above   Subjective:  Calmer but still quite disorganized writing on numerous sheets of paper her "plans" tells me he wants to leave and build a house and has 3 jobs in [place] all of which I cannot verify Also states she wants to go home Sitter reports was throwing feces an hour prior to my arrival and was belligerent   Objective: Vitals:   03/08/22 2002 03/09/22 0025 03/09/22 0433 03/10/22 0619  BP: (!) 154/84  (!) 150/80 (!) 154/89  Pulse: 75  86 72  Resp: 16 20 18 19   Temp: 97.8 F (36.6 C)  98.7 F (37.1 C) 98.4 F (36.9 C)  TempSrc: Oral  Oral Oral  SpO2: 100%  96% 98%  Height: 5\' 9"  (1.753 m)       Intake/Output Summary (Last 24 hours) at 03/10/2022 1549 Last data filed at 03/10/2022 0820 Gross per 24 hour  Intake 953.14 ml  Output --  Net 953.14 ml    There were no vitals filed for this visit.  Examination:  Awake coherent no distress less combative and labile but quite tangential and disorganized Chest is clear S1-S2 no murmur Toes reviewed no real change since yesterday Abdomen soft no rebound  I did not do a full neuro assessment    Data Reviewed: personally reviewed   CBC    Component Value Date/Time   WBC 4.9 03/09/2022 0459   RBC 3.93 03/09/2022 0459   HGB 11.1 (L) 03/09/2022 0459   HCT 33.9 (L) 03/09/2022 0459   PLT 267 03/09/2022 0459   MCV  86.3 03/09/2022 0459   MCH 28.2 03/09/2022 0459   MCHC 32.7 03/09/2022 0459   RDW 12.9 03/09/2022 0459   LYMPHSABS 2.3 03/06/2022 2111   MONOABS 0.5 03/06/2022 2111   EOSABS 0.2 03/06/2022 2111   BASOSABS 0.1 03/06/2022 2111      Latest Ref Rng & Units 03/09/2022    4:59 AM 03/08/2022    7:23 PM 03/06/2022    9:11 PM  CMP  Glucose 70 - 99 mg/dL 363  272  338   BUN  6 - 20 mg/dL 16  14  22    Creatinine 0.44 - 1.00 mg/dL 0.98  0.86  0.98   Sodium 135 - 145 mmol/L 134  137  137   Potassium 3.5 - 5.1 mmol/L 4.4  3.9  3.8   Chloride 98 - 111 mmol/L 100  103  101   CO2 22 - 32 mmol/L 26  26  23    Calcium 8.9 - 10.3 mg/dL 9.2  9.4  9.3   Total Protein 6.5 - 8.1 g/dL  6.9  7.1   Total Bilirubin 0.3 - 1.2 mg/dL  0.4  0.5   Alkaline Phos 38 - 126 U/L  94  100   AST 15 - 41 U/L  14  21   ALT 0 - 44 U/L  19  22      Radiology Studies: No results found.   Scheduled Meds:  amoxicillin-clavulanate  1 tablet Oral Q12H   insulin aspart  0-9 Units Subcutaneous TID WC   living well with diabetes book   Does not apply Once   LORazepam  2 mg Oral BID   Or   LORazepam  2 mg Intramuscular BID   OLANZapine zydis  10 mg Oral BID   Or   OLANZapine  10 mg Intramuscular BID   Continuous Infusions:     LOS: 1 day   Time spent: [place], MD [facility] To contact the attending provider between 7A-7P or the covering provider during after hours 7P-7A, please log into the web site [email or web address] and access using universal [place] password for that web site. If you do not have the password, please call the hospital operator.  03/10/2022, 3:49 PM

## 2022-03-11 DIAGNOSIS — I96 Gangrene, not elsewhere classified: Secondary | ICD-10-CM | POA: Diagnosis not present

## 2022-03-11 LAB — GLUCOSE, CAPILLARY
Glucose-Capillary: 443 mg/dL — ABNORMAL HIGH (ref 70–99)
Glucose-Capillary: 472 mg/dL — ABNORMAL HIGH (ref 70–99)
Glucose-Capillary: 141 mg/dL — ABNORMAL HIGH (ref 70–99)
Glucose-Capillary: 102 mg/dL — ABNORMAL HIGH (ref 70–99)
Glucose-Capillary: 311 mg/dL — ABNORMAL HIGH (ref 70–99)
Glucose-Capillary: 316 mg/dL — ABNORMAL HIGH (ref 70–99)

## 2022-03-11 MED ORDER — INSULIN ASPART 100 UNIT/ML IJ SOLN
5.0000 [IU] | Freq: Once | INTRAMUSCULAR | Status: AC
  Administered 2022-03-12: 5 [IU] via SUBCUTANEOUS

## 2022-03-11 MED ORDER — STERILE WATER FOR INJECTION IJ SOLN
INTRAMUSCULAR | Status: AC
  Administered 2022-03-11: 10 mL
  Filled 2022-03-11: qty 10

## 2022-03-11 MED ORDER — INSULIN ASPART 100 UNIT/ML IJ SOLN
20.0000 [IU] | Freq: Once | INTRAMUSCULAR | Status: AC
  Administered 2022-03-11: 20 [IU] via SUBCUTANEOUS

## 2022-03-11 NOTE — Consult Note (Signed)
[facility] Psychiatry New Face-to-Face Psychiatric Evaluation   Service Date: March 11, 2022 LOS:  LOS: 2 days    Assessment  Katherine Franklin is a 56 y.o. female admitted medically for 03/06/2022 10:48 PM for psychotic behavior, brought in by EMS and found to have concern for gas forming infection in bilateral lower extremities. She carries the psychiatric diagnoses of schizoaffective disorder, cocaine abuse, PTSD and has a past medical history of  hypertension and uncontrolled diabetes.Psychiatry was consulted for "PSYCHOSIS, AGGRESSION, AND AGITATION BEING MEDICALLY ADMITTED" by [facility].   Progress note: 03/11/22 Patient is seen in her room today, her chart is reviewed and treatment plan discussed with the team. Today, she is uncooperative, belligerent, easily agitated, labile, psychotic, paranoid with disorganized thought process. She is obsessed with food, still refuses oral medications but has been accepting intramuscular injections.  She is under IVC and will benefit from inpatient psychiatric admission based on initial assessment given her active risk of harm to self and others.   Given her refusal to take oral medications and lack of realistic opportunity for improvement without medication, she will be put on forced medication protocol.  Please see plan below for detailed recommendations.   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 This decision is based on my review of the chart including patient's history and current presentation, interview of  the patient, mental status examination, and consideration of suicide risk including evaluating suicidal ideation, plan, intent, suicidal or self-harm behaviors, risk factors, and protective factors. This judgment is based on our ability to directly address suicide risk, implement suicide prevention strategies and develop a safety plan while the patient is in the clinical setting. Please contact our team if there is a concern that risk level has changed. - Requires non-violent restraint at this time due to elopement risk   ##Schizoaffective Disorder ##Substance induced Psychotic Disorder ##Cocaine Use Disorder --Discontinue Abilify 10mg   FORCED MEDICATION PROTOCOL (initiated 03/09/22) --Continue Zyprexa 10 mg BID PO or IM --Continue  Ativan 2 mg BID PO or IM  --Daily ECG to check for Qtc prolongation  --Requires renewal every 7 days (next due: 03/16/22)  --Continue Agitation Protocol: Haldol 5 mg + Benadryl 50 mg + Ativan 2 mg q6h   ## Medical Decision Making Capacity:  Not formally assessed today  ## Further Work-up:  -- defer to primary regarding dry gangrene and infectious workup  -- most recent EKG on 03/07/22 had QtC of 439 -- Pertinent labwork reviewed earlier this admission includes:   -HGB 10.9  -BG 338  -UDS pending from this hospitalization   - TSH 0.19  -A1c 11.3  ## Disposition:  -- Inpatient psychiatry once medically stable  ## Behavioral / Environmental:  -- Patient has been aggressive with staff and attempted to elope multiple times (see separate nursing note for further details)  ##Legal Status --Under IVC  Thank you for this consult request. Recommendations have been communicated to the primary team.  We will follow at this time.   03/09/22, MD  NEW history  Relevant Aspects of Hospital Course:  Admitted on 03/06/2022 for psychotic behavior brought in by EMS, found to have bilateral lower extremities infection in the setting of uncontrolled  diabetes.  Patient Report:  Per chart review, the patient was also seen at the ED 10/4 (hyperglycemia), 10/7 (HI and detox request), 10/11 (hyperglycemia, homelessness), 10/14 (concern for drug overdose), 10/16 (voluntary transfer to [facility], discharged with resources, not eligible for [facility] for insurance), 10/20 (requesting BG check), 10/22 (agitated behavior), 10/23 (agitated behavior),  10/24 (eating feces).  Was called by RN to see patient as she had attempted to elope. She was just given geodon 20 mg and ativan 2 mg <1 hour prior to elopement.  Patient on evaluation was in 4 point non-violent restraints, shouting and disorganized. Per RN, patient had thought that if she was released from restraints, she would be able to leave. Patient perseverative that she can leave after her toe is amputated, so she asked for a pair of scissors to amputate toes herself. Patient continues to shout and attempts to use her teeth to pull off soft restraints. Difficult to redirect. She did appear slightly more somnolent by end of brief assessment.  ROS:  Unable to assess due to lability   Psychiatric History:  Information collected from chart: Multiple ED visits, hx of schizoaffective disorder and cocaine use disorder   Social History:  Tobacco use: unknown Alcohol use: unknown Drug use: UDS positive for cocaine last ED visit  Family History:  The patient's Family history is unknown by patient.  Medical History: Past Medical History:  Diagnosis Date   Diabetes mellitus without complication (HCC)    Drug abuse (HCC) 09/22/2018   GERD (gastroesophageal reflux disease)    Hypertension    PTSD (post-traumatic stress disorder)    Schizo affective schizophrenia (HCC)     Surgical History: Past Surgical History:  Procedure Laterality Date   SKIN GRAFT     TUBAL LIGATION      Medications:   Current Facility-Administered Medications:    acetaminophen (TYLENOL) tablet 650 mg, 650 mg, Oral, Q6H PRN  **OR** acetaminophen (TYLENOL) suppository 650 mg, 650 mg, Rectal, Q6H PRN, Orland Mustard, MD   amoxicillin-clavulanate (AUGMENTIN) 875-125 MG per tablet 1 tablet, 1 tablet, Oral, Q12H, Samtani, Jai-Gurmukh, MD, 1 tablet at 03/11/22 0809   haloperidol lactate (HALDOL) injection 5 mg, 5 mg, Intramuscular, Q6H PRN, 5 mg at 03/10/22 1404 **AND** diphenhydrAMINE (BENADRYL) injection 50 mg, 50 mg, Intramuscular, Q6H PRN, 50 mg at 03/10/22 1404 **AND** LORazepam (ATIVAN) injection 2 mg, 2 mg, Intramuscular, Q6H PRN, Park Pope, MD, 2 mg at 03/10/22 1404   insulin aspart (novoLOG) injection 0-9 Units, 0-9 Units, Subcutaneous, TID WC, Orland Mustard, MD, 9 Units at 03/10/22 1533   insulin aspart (novoLOG) injection 20 Units, 20 Units, Subcutaneous, Once, Rhetta Mura, MD   living well with diabetes book MISC, , Does not apply, Once, Rhetta Mura, MD   LORazepam (ATIVAN) tablet 2 mg, 2 mg, Oral, BID, 2 mg at 03/10/22 9528 **OR** LORazepam (ATIVAN) injection 2 mg, 2 mg, Intramuscular, BID, Park Pope, MD, 2 mg at 03/11/22 0808   OLANZapine zydis (ZYPREXA) disintegrating tablet 10 mg, 10 mg, Oral, BID, 10 mg at 03/10/22 0925 **OR** OLANZapine (ZYPREXA) injection 10 mg, 10 mg, Intramuscular, BID, Park Pope, MD, 10 mg at 03/11/22 4132  Allergies: Allergies  Allergen Reactions   Sulfa Antibiotics Shortness Of Breath, Rash and Swelling   Latex Rash       Objective  Vital  signs:  Temp:  [98.3 F (36.8 C)-98.9 F (37.2 C)] 98.9 F (37.2 C) (10/29 0810) Pulse Rate:  [95-96] 96 (10/29 0810) Resp:  [16] 16 (10/29 0810) BP: (131-140)/(80-89) 131/80 (10/29 0810) SpO2:  [90 %-97 %] 97 % (10/29 0810)  Psychiatric Specialty Exam:  Presentation  General Appearance:  Disheveled  Eye Contact: None  Speech: Pressured  Speech Volume: Increased  Handedness: Right   Mood and Affect  Mood: Angry; Labile  Affect: Labile   Thought Process  Thought  Processes: Disorganized  Descriptions of Associations:Tangential  Orientation:-- (Unable to assess due to lability)  Thought Content:-- (Unable to assess due to lability)  History of Schizophrenia/Schizoaffective disorder:No data recorded Reported in chart review Duration of Psychotic Symptoms:No data recorded >6 months Hallucinations:No data recorded  Ideas of Reference:-- (Unable to assess due to lability)  Suicidal Thoughts:No data recorded  Homicidal Thoughts:No data recorded   Sensorium  Memory: -- (Unable to assess due to lability)  Judgment: Impaired  Insight: None   Executive Functions  Concentration: Poor  Attention Span: Poor  Recall: Poor  Fund of Knowledge: Poor  Language: Poor   Psychomotor Activity  Psychomotor Activity: No data recorded   Assets  Assets: Communication Skills; Resilience   Sleep  Sleep: No data recorded    Physical Exam: Physical Exam Constitutional:      General: She is in acute distress.     Appearance: She is obese. She is ill-appearing.  HENT:     Head: Normocephalic and atraumatic.  Cardiovascular:     Rate and Rhythm: Normal rate.  Pulmonary:     Effort: No respiratory distress.  Musculoskeletal:        General: Normal range of motion.  Skin:    Comments: Scarring on bilateral thighs with evidence of graft on right thigh  Neurological:     Mental Status: She is disoriented.    ROS Blood pressure 131/80, pulse 96, temperature 98.9 F (37.2 C), temperature source Oral, resp. rate 16, height 5\' 9"  (1.753 m), SpO2 97 %. Body mass index is 35.44 kg/m.

## 2022-03-11 NOTE — Progress Notes (Signed)
RN called to room by sitter. Pt extremely agitated. Yelling, cursing and threatening staff. Pt then came out into hallway and began screaming that "We can't keep her here, and we're trying to kill her." This Probation officer and other staff attempted to de-escalate and calmly assist pt back into room. PRN meds administered. Pt went back into room but then proceeded to tear curtains from the ceiling, tore off cabinet door over sink, and turned over IV pole and bedside table. MD notified. Security notified and requested to bedside. When staff attempted to enter room from anteroom pt then grabbed trashbag from the can and placed over her head and attempted to tighten it. Bag removed by staff and pt placed in 4 point restraints. VSS. Placed on continuous pulse ox. Attempted EKG again, as pt has refused all day.  Pt began spitting at staff and threatening to "bite and give yall full blown AIDS if you come close to me." Will continue to monitor closely and attempt EKG at later time.

## 2022-03-11 NOTE — Progress Notes (Signed)
PROGRESS NOTE Patient cleared form Medicine perspective for placement--has dry gangrene requiring no new intervention  Katherine Franklin  WUJ:[id number] DOB: April 18, 1966 DOA: 03/06/2022 PCP: Patient, No Pcp Per  Brief Narrative:  56 year old black female prior schizoaffective disorder mood induced disorder--no meds in the past 5 years DM TY 2 HTN Several emergency room visits starting 01/2022--imaging 9/18 showed dry gangrene not requiring intervention several other episodes emergency room visits for hyperglycemia with a sugar in the 400s  Vacillating mood finally admitted to the hospital 10/24 after being found naked at the sit to go eating her own feces with agitation and aggression and delusions of persecution  Psychiatry has been consulted as she is remained belligerent aggressive and delusional and this seems more in keeping with some type of substance abuse  Hospital-Problem based course  Psychiatric concerns?  Substance abuse mood disorder out of restraints slightly calmer IVC per psychiatry renewal 11/3, one-to-one sitter, periodic EKG Agree with psychiatry forced med protocol with Zydis, Ativan, Benadryl, lorazepam ATC Will require placement as per psychiatry  Persistent dry gangrene Tries to bargain about getting imaging and asks about discharge after imaging  Dr. Amalia Hailey of podiatry evaluated 10/27--no gas gangrene acute infection recommend betadine to distal tips of toes and foot outpatient follow-up I have discontinued IV antibiotics and placed on Augmentin until 10/30-patient is medically stable from my perspective and can go to psych facility whenever psych can arrange  DM TY 2 on metformin at home Refusing meds, CBG checks intermittently CBGas high as 400 but easily controlled with ssi down to 100 Nursing to attempt when patient willing  Uncontrolled HTN refusing care at times on lisinopril PTA-monitor  DVT prophylaxis: SCD Code Status: Presumed full Family Communication:  None present Disposition:  Status is: Inpatient Remains inpatient appropriate because:   Not appropriate for discharge.  IVC   Consultants:  Podiatry Psychiatry  Procedures: None yet  Antimicrobials: As above   Subjective:  Calmer seems a little sedate Wants to go home.  I have explained to her several x's since yesterday psychiatry is the one who makes this call. I'm not sure if she is aware that she needs to be placed   Objective: Vitals:   03/09/22 0433 03/10/22 0619 03/10/22 2151 03/11/22 0810  BP: (!) 150/80 (!) 154/89 (!) 140/89 131/80  Pulse: 86 72 95 96  Resp: 18 19 16 16   Temp: 98.7 F (37.1 C) 98.4 F (36.9 C) 98.3 F (36.8 C) 98.9 F (37.2 C)  TempSrc: Oral Oral Oral Oral  SpO2: 96% 98% 90% 97%  Height:        Intake/Output Summary (Last 24 hours) at 03/11/2022 1334 Last data filed at 03/11/2022 0800 Gross per 24 hour  Intake 360 ml  Output 0 ml  Net 360 ml    There were no vitals filed for this visit.  Examination:  Awake coherent no distress less combative still slight tangetiality Chest is clear S1-S2 no murmur Did not examine toe today    Data Reviewed: personally reviewed   CBC    Component Value Date/Time   WBC 4.9 03/09/2022 0459   RBC 3.93 03/09/2022 0459   HGB 11.1 (L) 03/09/2022 0459   HCT 33.9 (L) 03/09/2022 0459   PLT 267 03/09/2022 0459   MCV 86.3 03/09/2022 0459   MCH 28.2 03/09/2022 0459   MCHC 32.7 03/09/2022 0459   RDW 12.9 03/09/2022 0459   LYMPHSABS 2.3 03/06/2022 2111   MONOABS 0.5 03/06/2022 2111   EOSABS 0.2 03/06/2022 2111  BASOSABS 0.1 03/06/2022 2111      Latest Ref Rng & Units 03/09/2022    4:59 AM 03/08/2022    7:23 PM 03/06/2022    9:11 PM  CMP  Glucose 70 - 99 mg/dL 161  096  045   BUN 6 - 20 mg/dL 16  14  22    Creatinine 0.44 - 1.00 mg/dL  4.09  8.11   Sodium 135 - 145 mmol/L 134  137  137   Potassium 3.5 - 5.1 mmol/L 4.4  3.9  3.8   Chloride 98 - 111 mmol/L 100  103  101   CO2 22 -  32 mmol/L 26  26  23    Calcium 8.9 - 10.3 mg/dL 9.2  9.4  9.3   Total Protein 6.5 - 8.1 g/dL  6.9  7.1   Total Bilirubin 0.3 - 1.2 mg/dL  0.4  0.5   Alkaline Phos 38 - 126 U/L  94  100   AST 15 - 41 U/L  14  21   ALT 0 - 44 U/L  19  22      Radiology Studies: No results found.   Scheduled Meds:  amoxicillin-clavulanate  1 tablet Oral Q12H   insulin aspart  0-9 Units Subcutaneous TID WC   insulin aspart  20 Units Subcutaneous Once   living well with diabetes book   Does not apply Once   LORazepam  2 mg Oral BID   Or   LORazepam  2 mg Intramuscular BID   OLANZapine zydis  10 mg Oral BID   Or   OLANZapine  10 mg Intramuscular BID   Continuous Infusions:     LOS: 2 days   Time spent: 24  9.14, MD [facility] To contact the attending provider between 7A-7P or the covering provider during after hours 7P-7A, please log into the web site [email or web address] and access using universal [place] password for that web site. If you do not have the password, please call the hospital operator.  03/11/2022, 1:34 PM

## 2022-03-12 LAB — GLUCOSE, CAPILLARY
Glucose-Capillary: 299 mg/dL — ABNORMAL HIGH (ref 70–99)
Glucose-Capillary: 356 mg/dL — ABNORMAL HIGH (ref 70–99)
Glucose-Capillary: 364 mg/dL — ABNORMAL HIGH (ref 70–99)
Glucose-Capillary: 373 mg/dL — ABNORMAL HIGH (ref 70–99)

## 2022-03-12 NOTE — TOC Initial Note (Signed)
Transition of Care [facility]) - Initial/Assessment Note    Patient Details  Name: Katherine Franklin MRN: [id number] Date of Birth: 1965/12/24  [facility] CM/SW Contact:    Joanne Chars, LCSW Phone Number: 03/12/2022, 3:40 PM  Clinical Narrative:    Pt under IVC.  No beds available at [facility].  Referral faxed to following facilities: [place]               Expected Discharge Plan: Psychiatric Hospital Barriers to Discharge: Psych Bed not available   Patient Goals and CMS Choice        Expected Discharge Plan and Services Expected Discharge Plan: [place] arrangements for the past 2 months: Homeless                                      Prior Living Arrangements/Services Living arrangements for the past 2 months: Homeless Lives with:: Self                   Activities of Daily Living      Permission Sought/Granted                  Emotional Assessment              Admission diagnosis:  Gangrene ([place]) [I96] Toe infection [L08.9] Involuntary commitment [Z04.6] Patient Active Problem List   Diagnosis Date Noted   Gangrene ([place]) 03/08/2022   Psychosis ([place]) 03/07/2022   Type 2 diabetes mellitus ([place]) 03/07/2022   Dry gangrene toes of bilateral feet ([place]) 03/07/2022   Inadequate community resources 02/26/2022   Cocaine use disorder ([place]) 02/26/2022   Cellulitis 04/19/2019   Substance induced mood disorder ([place]) 04/09/2019   Homelessness 04/09/2019   History of posttraumatic stress disorder (PTSD) 04/09/2019   Cocaine dependence ([place]) 08/18/2018   Major depressive disorder, recurrent severe without psychotic features ([place]) 08/18/2018   PCP:  Patient, No Pcp Per Pharmacy:   [facility] [place], [place] [address] [place] [address] Phone: [phone]  Fax: [phone]  [id number] [address] [place] [address] Phone: [phone] Fax: [phone]  [place] [address] [address] Phone: [phone] Fax: [place] [address] [address] Phone: [phone] Fax: [phone]     Social Determinants of Health (SDOH) Interventions    Readmission Risk Interventions     No data to display

## 2022-03-12 NOTE — Progress Notes (Signed)
She is medically stable, feet have stable dry gangrene and hyperkeratotic without acute infection  Behavior driven by volitional personality issues +/- some bizarre beliefs   HIV neg [despite her threat of biting and infecting people] "  Refused to interact with this examiner today -kept cover over her head entire time--bargaining for snack   " I'm gonna put lotion on my feet and it will heal"  When I said nursing was in charge of this and asked if I could examine her, she declined and said   "Get the Fu$% up out my room"  Discussed with psych resident-- She continues to refuse most aspects of medical care.  Please assume care of patient as her co morbid psych issues drive her Hospitalization  We can follow if needed-let us know  Thanks  No charge  Verneita Griffes, MD [facility] 4:02 PM

## 2022-03-12 NOTE — Progress Notes (Signed)
Patient asking to get her leg off restraints to go to the bathroom, restraints removed from legs. Patient is laying down in her bed at this moment. Will continue to monitor.

## 2022-03-12 NOTE — Progress Notes (Signed)
Patient calm at this time, asking to remove restraints off her hands to eat ice cream. Restraints removed from her hands. Patient sleeping. Will continue to monitor.

## 2022-03-12 NOTE — Consult Note (Signed)
[facility] Follow Up Face-to-Face Psychiatric Evaluation   Service Date: March 12, 2022 LOS:  LOS: 3 days    Assessment  Katherine Franklin is a 56 y.o. female admitted medically for 03/06/2022 10:48 PM for psychotic behavior, brought in by EMS and found to have concern for gas forming infection in bilateral lower extremities. She carries the psychiatric diagnoses of schizoaffective disorder, cocaine abuse, PTSD and has a past medical history of  hypertension and uncontrolled diabetes.Psychiatry was consulted for "PSYCHOSIS, AGGRESSION, AND AGITATION BEING MEDICALLY ADMITTED" by [facility].    She is under IVC and will benefit from inpatient psychiatric admission based on initial assessment given her active risk of harm to self and others. MEDICALLY STABLE per Dr. Verlon Au. Requires inpatient psychiatry ASAP.   Given her refusal to take oral medications and lack of realistic opportunity for improvement without medication, she is on forced medication protocol. Received IM admnistration for past 2 days due to refusal to take PO.   Continues to be intermittently agitated and throughout the weekend. Episode yesterday afternoon where she tore up multiple parts of room, spit at staff, and threatened to "bite and give yall full blown AIDS if you come close to me". Has required agitation protocol Continues to be agitated today.   Please see plan below for detailed recommendations.   Diagnoses:  Active Hospital problems: Principal Problem:   Dry gangrene toes of bilateral feet (HCC) Active Problems:   Cocaine dependence (HCC)   Substance induced mood disorder (HCC)   Psychosis ([place])   Type 2 diabetes mellitus ([place])   Gangrene ([place])     Plan  ## Safety and Observation Level:  - Based on my clinical evaluation, I estimate the patient to be at high risk of self harm in the current setting - Continues to be on 1:1 due to severe agitation.  - Requires non-violent restraint at  this time due to elopement risk   ##Schizoaffective Disorder ##Substance induced Psychotic Disorder ##Cocaine Use Disorder FORCED MEDICATION PROTOCOL (initiated 03/09/22) --Continue Zyprexa 10 mg BID PO or IM --Continue  Ativan 2 mg BID PO or IM  --discontinue daily ECG as her Qtc has been relatively stable and has not required multiple PRNs  --Requires renewal every 7 days (next due: 03/16/22) --Continue Agitation Protocol: Haldol 5 mg + Benadryl 50 mg + Ativan 2 mg q6h   ## Medical Decision Making Capacity:  Not formally assessed today  ## Further Work-up:  -MEDICALLY CLEARED  -- most recent EKG on 03/11/22 had QtC of 447 -- Pertinent labwork reviewed earlier this admission includes:   -HGB 10.9  -BG 338  -UDS pending from this hospitalization   - TSH 0.19  -A1c 11.3  ## Disposition:  [facility] admission coordinator reviewing  ## Behavioral / Environmental:  -- Patient has been aggressive with staff and attempted to elope multiple times (see separate nursing note for further details)  ##Legal Status --Under IVC  Thank you for this consult request. Recommendations have been communicated to the primary team.  We will follow at this time.   [name], MD   NEW history  Relevant Aspects of Hospital Course:  Admitted on 03/06/2022 for psychotic behavior brought in by EMS, found to have bilateral lower extremities infection in the setting of uncontrolled diabetes.  Patient Report:  Per chart review, the patient was also seen at the ED 10/4 (hyperglycemia), 10/7 (HI and detox request), 10/11 (hyperglycemia, homelessness), 10/14 (concern for drug overdose), 10/16 (voluntary transfer to [facility], discharged  with resources, not eligible for [facility] for insurance), 10/20 (requesting BG check), 10/22 (agitated behavior), 10/23 (agitated behavior),  10/24 (eating feces).  Upon evaluation today, patient is very insistent that she needs to leave hospital.  Patient stated she was very  frustrated because she had attempted to solicit money from people on the street.  Patient made it very clear that she had not eaten any feces, she had merely "licked it".  This distinction was very important to her as she states that she does not swallow any other feces.  Patient is adamant that she does not have any SI/HI.   Patient did state that she had previously been to [facility] in [place] but was attempting to establish care with outpatient psychiatry at [facility] but "you guys keep getting in my away from doing it".  Discussed that we were doing her best to adjust medications appropriately to manage her schizoaffective disorder but patient very difficult to redirect and align goals with.  Patient is very distrustful of medical staff and persistently attempts to negotiate to get more ice cream.  Patient appropriately discontinued assessment saying "bye" multiple times.  Discussed that should she not require restraints, she would be transferred to behavioral health hospital, which patient appeared to understand.  ROS:  Unable to assess due to lability   Psychiatric History:  Information collected from chart: Multiple ED visits, hx of schizoaffective disorder and cocaine use disorder   Social History:  Tobacco use: unknown Alcohol use: unknown Drug use: UDS positive for cocaine last ED visit  Family History:  The patient's Family history is unknown by patient.  Medical History: Past Medical History:  Diagnosis Date   Diabetes mellitus without complication ([place])    Drug abuse ([place]) 09/22/2018   GERD (gastroesophageal reflux disease)    Hypertension    PTSD (post-traumatic stress disorder)    Schizo affective schizophrenia ([place])     Surgical History: Past Surgical History:  Procedure Laterality Date   SKIN GRAFT     TUBAL LIGATION      Medications:   Current Facility-Administered Medications:    acetaminophen (TYLENOL) tablet 650 mg, 650  mg, Oral, Q6H PRN **OR** acetaminophen (TYLENOL) suppository 650 mg, 650 mg, Rectal, Q6H PRN, Orma Flaming, MD   amoxicillin-clavulanate (AUGMENTIN) 875-125 MG per tablet 1 tablet, 1 tablet, Oral, Q12H, Samtani, Jai-Gurmukh, MD, 1 tablet at 03/11/22 2248   haloperidol lactate (HALDOL) injection 5 mg, 5 mg, Intramuscular, Q6H PRN, 5 mg at 03/11/22 1544 **AND** diphenhydrAMINE (BENADRYL) injection 50 mg, 50 mg, Intramuscular, Q6H PRN, 50 mg at 03/11/22 1544 **AND** LORazepam (ATIVAN) injection 2 mg, 2 mg, Intramuscular, Q6H PRN, [name], MD, 2 mg at 03/11/22 1544   insulin aspart (novoLOG) injection 0-9 Units, 0-9 Units, Subcutaneous, TID WC, Orma Flaming, MD, 7 Units at 03/11/22 1702   living well with diabetes book MISC, , Does not apply, Once, Nita Sells, MD   LORazepam (ATIVAN) tablet 2 mg, 2 mg, Oral, BID, 2 mg at 03/10/22 [id number] **OR** LORazepam (ATIVAN) injection 2 mg, 2 mg, Intramuscular, BID, [name], MD, 2 mg at 03/11/22 2247   OLANZapine zydis (ZYPREXA) disintegrating tablet 10 mg, 10 mg, Oral, BID, 10 mg at 03/10/22 0925 **OR** OLANZapine (ZYPREXA) injection 10 mg, 10 mg, Intramuscular, BID, [name], MD, 10 mg at 03/11/22 2248  Allergies: Allergies  Allergen Reactions   Sulfa Antibiotics Shortness Of Breath, Rash and Swelling   Latex Rash       Objective  Vital  signs:  Temp:  [97.7 F (36.5 C)] 97.7 F (36.5 C) (10/29 1615) Resp:  [18] 18 (10/29 1615) BP: (142)/(85) 142/85 (10/29 1615)  Psychiatric Specialty Exam:  Presentation  General Appearance:  Disheveled  Eye Contact: None  Speech: Pressured  Speech Volume: Increased  Handedness: Right   Mood and Affect  Mood: Angry; Labile  Affect: Labile   Thought Process  Thought Processes: Disorganized  Descriptions of Associations:Tangential  Orientation:-- (Unable to assess due to lability)  Thought Content:-- (Unable to assess due to lability)  History of  Schizophrenia/Schizoaffective disorder:No data recorded Reported in chart review Duration of Psychotic Symptoms:No data recorded >6 months Hallucinations:No data recorded  Ideas of Reference:-- (Unable to assess due to lability)  Suicidal Thoughts:denies  Homicidal Thoughts:denies   Sensorium  Memory: -- (Unable to assess due to lability)  Judgment: Impaired  Insight: None   Executive Functions  Concentration: Poor  Attention Span: Poor  Recall: Poor  Fund of Knowledge: Poor  Language: Poor   Psychomotor Activity  Psychomotor Activity: No data recorded   Assets  Assets: Communication Skills; Resilience   Sleep  Sleep: No data recorded    Physical Exam: Physical Exam Constitutional:      General: She is in acute distress.     Appearance: She is obese. She is ill-appearing.  HENT:     Head: Normocephalic and atraumatic.  Cardiovascular:     Rate and Rhythm: Normal rate.  Pulmonary:     Effort: No respiratory distress.  Musculoskeletal:        General: Normal range of motion.  Skin:    Comments: Scarring on bilateral thighs with evidence of graft on right thigh  Neurological:     Mental Status: She is disoriented.    ROS Blood pressure (!) 142/85, pulse 96, temperature 97.7 F (36.5 C), temperature source Oral, resp. rate 18, height 5\' 9"  (1.753 m), SpO2 97 %. Body mass index is 35.44 kg/m.

## 2022-03-12 NOTE — Progress Notes (Signed)
Patient refused EKG. Told Tech that unless she gets her two chocolate ice creams and two vanilla puddings, then she "ain't doing a GD thing."  Repeated that twice and said "BYE"Tech left.

## 2022-03-12 NOTE — Progress Notes (Signed)
Patient has either refused BG monitoring or SSI coverage 3x today. Patient finally took her AM medications orally at 1543 after a discussion with charge nurse. Additionally, patient refused to have EKG completed. Writer has been told to get the "f" out of her room a few times. Patient refused to have shift assessment completed. Patient becomes very angry when it comes to her diet and demands the sitter to order simple carbs like cake and ice cream. Writer has requested the sitter to not order these foods as patient is not being compliant with her diabetic orders. Charge nurse and attending physician are aware.

## 2022-03-13 ENCOUNTER — Encounter (HOSPITAL_COMMUNITY): Payer: Medicaid Other

## 2022-03-13 DIAGNOSIS — Z046 Encounter for general psychiatric examination, requested by authority: Principal | ICD-10-CM

## 2022-03-13 DIAGNOSIS — I96 Gangrene, not elsewhere classified: Secondary | ICD-10-CM | POA: Diagnosis not present

## 2022-03-13 LAB — GLUCOSE, CAPILLARY
Glucose-Capillary: 300 mg/dL — ABNORMAL HIGH (ref 70–99)
Glucose-Capillary: 321 mg/dL — ABNORMAL HIGH (ref 70–99)
Glucose-Capillary: 457 mg/dL — ABNORMAL HIGH (ref 70–99)
Glucose-Capillary: 322 mg/dL — ABNORMAL HIGH (ref 70–99)
Glucose-Capillary: 141 mg/dL — ABNORMAL HIGH (ref 70–99)

## 2022-03-13 MED ORDER — INSULIN ASPART 100 UNIT/ML IJ SOLN
20.0000 [IU] | Freq: Once | INTRAMUSCULAR | Status: AC
  Administered 2022-03-13: 20 [IU] via SUBCUTANEOUS

## 2022-03-13 NOTE — Consult Note (Signed)
[name] Follow Up Face-to-Face Psychiatric Evaluation   Service Date: March 13, 2022 LOS:  LOS: 4 days    Assessment  Katherine Franklin is a 56 y.o. female admitted medically for 03/06/2022 10:48 PM for psychotic behavior, brought in by EMS and found to have concern for gas forming infection in bilateral lower extremities. She carries the psychiatric diagnoses of schizoaffective disorder, cocaine abuse, PTSD and has a past medical history of  hypertension and uncontrolled diabetes.Psychiatry was consulted for "PSYCHOSIS, AGGRESSION, AND AGITATION BEING MEDICALLY ADMITTED" by [facility].    She is under IVC and will benefit from inpatient psychiatric admission based on initial assessment given her active risk of harm to self and others. MEDICALLY STABLE per Dr. Mahala Menghini. Requires inpatient psychiatry ASAP. No inpatient beds at [facility] and requires  Given her refusal to take oral medications and lack of realistic opportunity for improvement without medication, she is on forced medication protocol. Received IM admnistration for past 2 days due to refusal to take PO.   Continues to be intermittently agitated and throughout the weekend. Slightly less agitated today but continues to be labile. Did take PO meds yesterday.   Please see plan below for detailed recommendations.   Diagnoses:  Active Hospital problems: Principal Problem:   Dry gangrene toes of bilateral feet (HCC) Active Problems:   Cocaine dependence (HCC)   Substance induced mood disorder (HCC)   Psychosis (HCC)   Type 2 diabetes mellitus (HCC)   Gangrene (HCC)   Involuntary commitment     Plan  ## Safety and Observation Level:  - Based on my clinical evaluation, I estimate the patient to be at high risk of self harm in the current setting - Continues to be on 1:1 due to severe agitation.  - Requires non-violent restraint at this time due to elopement risk   ##Schizoaffective Disorder ##Substance  induced Psychotic Disorder ##Cocaine Use Disorder FORCED MEDICATION PROTOCOL (initiated 03/09/22) --Continue Zyprexa 10 mg BID PO or IM --Continue  Ativan 2 mg BID PO or IM  --discontinue daily ECG as her Qtc has been relatively stable and has not required multiple PRNs  --Requires renewal every 7 days (next due: 03/16/22) --Continue Agitation Protocol: Haldol 5 mg + Benadryl 50 mg + Ativan 2 mg q6h   ## Medical Decision Making Capacity:  Not formally assessed today  ## Further Work-up:  -MEDICALLY CLEARED  -- most recent EKG on 03/11/22 had QtC of 447 -- Pertinent labwork reviewed earlier this admission includes:   -HGB 10.9  -BG 338  -UDS pending from this hospitalization   - TSH 0.19  -A1c 11.3  ## Disposition:  [facility] admission coordinator reviewing  ## Behavioral / Environmental:  -- Patient has been aggressive with staff and attempted to elope multiple times (see separate nursing note for further details)  ##Legal Status --Under IVC  Thank you for this consult request. Recommendations have been communicated to the primary team.  We will follow at this time.   Park Pope, MD   NEW history  Relevant Aspects of Hospital Course:  Admitted on 03/06/2022 for psychotic behavior brought in by EMS, found to have bilateral lower extremities infection in the setting of uncontrolled diabetes.  Patient Report:  Per chart review, the patient was also seen at the ED 10/4 (hyperglycemia), 10/7 (HI and detox request), 10/11 (hyperglycemia, homelessness), 10/14 (concern for drug overdose), 10/16 (voluntary transfer to [facility], discharged with resources, not eligible for [facility] for insurance), 10/20 (requesting BG check), 10/22 (agitated behavior),  10/23 (agitated behavior),  10/24 (eating feces).  Slightly less labile today. Was seen eating snacks today. Not in restraints. Reports being in a better mood due her disability check coming tomorrow. Continues to be intermittently agitated and  antagonizing staff.  ROS:  Unable to assess due to lability   Psychiatric History:  Information collected from chart: Multiple ED visits, hx of schizoaffective disorder and cocaine use disorder   Social History:  Tobacco use: unknown Alcohol use: unknown Drug use: UDS positive for cocaine last ED visit  Family History:  The patient's Family history is unknown by patient.  Medical History: Past Medical History:  Diagnosis Date   Diabetes mellitus without complication ([place])    Drug abuse ([place]) 09/22/2018   GERD (gastroesophageal reflux disease)    Hypertension    PTSD (post-traumatic stress disorder)    Schizo affective schizophrenia ([place])     Surgical History: Past Surgical History:  Procedure Laterality Date   SKIN GRAFT     TUBAL LIGATION      Medications:   Current Facility-Administered Medications:    acetaminophen (TYLENOL) tablet 650 mg, 650 mg, Oral, Q6H PRN **OR** acetaminophen (TYLENOL) suppository 650 mg, 650 mg, Rectal, Q6H PRN, Orma Flaming, MD   amoxicillin-clavulanate (AUGMENTIN) 875-125 MG per tablet 1 tablet, 1 tablet, Oral, Q12H, Samtani, Jai-Gurmukh, MD, 1 tablet at 03/13/22 0948   haloperidol lactate (HALDOL) injection 5 mg, 5 mg, Intramuscular, Q6H PRN, 5 mg at 03/11/22 1544 **AND** diphenhydrAMINE (BENADRYL) injection 50 mg, 50 mg, Intramuscular, Q6H PRN, 50 mg at 03/11/22 1544 **AND** LORazepam (ATIVAN) injection 2 mg, 2 mg, Intramuscular, Q6H PRN, [name], MD, 2 mg at 03/11/22 1544   insulin aspart (novoLOG) injection 0-9 Units, 0-9 Units, Subcutaneous, TID WC, Orma Flaming, MD, 7 Units at 03/13/22 6789   living well with diabetes book MISC, , Does not apply, Once, Nita Sells, MD   LORazepam (ATIVAN) tablet 2 mg, 2 mg, Oral, BID, 2 mg at 03/13/22 0948 **OR** [DISCONTINUED] LORazepam (ATIVAN) injection 2 mg, 2 mg, Intramuscular, BID, [name], MD, 2 mg at 03/11/22 2247   OLANZapine zydis (ZYPREXA) disintegrating tablet 10 mg, 10  mg, Oral, BID, 10 mg at 03/13/22 0948 **OR** OLANZapine (ZYPREXA) injection 10 mg, 10 mg, Intramuscular, BID, [name], MD, 10 mg at 03/11/22 2248  Allergies: Allergies  Allergen Reactions   Sulfa Antibiotics Shortness Of Breath, Rash and Swelling   Latex Rash       Objective  Vital signs:  Temp:  [98.7 F (37.1 C)] 98.7 F (37.1 C) (10/30 2202) Pulse Rate:  [91] 91 (10/30 2202) BP: (128)/(77) 128/77 (10/30 2202) SpO2:  [100 %] 100 % (10/30 2202)  Psychiatric Specialty Exam:  Presentation  General Appearance:  Disheveled  Eye Contact: None  Speech: Pressured  Speech Volume: Increased  Handedness: Right   Mood and Affect  Mood: Angry; Labile  Affect: Labile   Thought Process  Thought Processes: Disorganized  Descriptions of Associations:Tangential  Orientation:-- (Unable to assess due to lability)  Thought Content:-- (Unable to assess due to lability)  History of Schizophrenia/Schizoaffective disorder:No data recorded Reported in chart review Duration of Psychotic Symptoms:No data recorded >6 months Hallucinations:No data recorded  Ideas of Reference:-- (Unable to assess due to lability)  Suicidal Thoughts:denies  Homicidal Thoughts:denies   Sensorium  Memory: -- (Unable to assess due to lability)  Judgment: Impaired  Insight: None   Executive Functions  Concentration: Poor  Attention Span: Poor  Recall: Poor  Fund of Knowledge: Poor  Language:  Poor   Psychomotor Activity  Psychomotor Activity: No data recorded   Assets  Assets: Communication Skills; Resilience   Sleep  Sleep: No data recorded    Physical Exam: Physical Exam Constitutional:      General: She is in acute distress.     Appearance: She is obese. She is ill-appearing.  HENT:     Head: Normocephalic and atraumatic.  Cardiovascular:     Rate and Rhythm: Normal rate.  Pulmonary:     Effort: No respiratory distress.  Musculoskeletal:         General: Normal range of motion.  Skin:    Comments: Scarring on bilateral thighs with evidence of graft on right thigh  Neurological:     Mental Status: She is disoriented.    ROS Blood pressure 128/77, pulse 91, temperature 98.7 F (37.1 C), temperature source Oral, resp. rate 18, height 5\' 9"  (1.753 m), SpO2 100 %. Body mass index is 35.44 kg/m.

## 2022-03-13 NOTE — Progress Notes (Signed)
PROGRESS NOTE Patient cleared form Medicine perspective for placement--has dry gangrene requiring no new intervention  Katherine Franklin  FBP:[id number] DOB: 08/26/1965 DOA: 03/06/2022 PCP: Patient, No Pcp Per  Brief Narrative:  56 year old black female prior schizoaffective disorder mood induced disorder--no meds in the past 5 years DM TY 2 HTN Several emergency room visits starting 01/2022--imaging 9/18 showed dry gangrene not requiring intervention several other episodes emergency room visits for hyperglycemia with a sugar in the 400s  Vacillating mood finally admitted to the hospital 10/24 after being found naked at the sit to go eating her own feces with agitation and aggression and delusions of persecution  Psychiatry has been consulted as she is remained belligerent aggressive and delusional and this seems more in keeping with some type of substance abuse  Plan is for discharge to psych facility as per psychiatry  Hospital-Problem based course  Psychiatric concerns?  Substance abuse mood disorder out of restraints slightly calmer IVC per psychiatry renewal 11/3, one-to-one sitter, periodic EKG  psychiatry forced med protocol with Zydis, Ativan, Benadryl, lorazepam  Will require placement as per psychiatry  Persistent dry gangrene Tries to bargain about getting imaging and asks about discharge after imaging  Dr. Amalia Hailey of podiatry evaluated 10/27--no gas gangrene acute infection recommend betadine to distal tips of toes and foot outpatient follow-up--- she seems more agreeable on 10/31 so I put in the orders IV antibiotics and p.o. Augmentin discontinued wounds reviewed on 10/31 remain stable and direct  DM TY 2 on metformin at home Refusing meds, CBG checks intermittently--also very noncompliant eating sweets ice cream etc. CBGas high as 400 but easily controlled with ssi down to 100 Nursing to attempt when patient willing to give sliding scale  Uncontrolled HTN refusing care at  times on lisinopril PTA-monitor  DVT prophylaxis: SCD Code Status: Presumed full Family Communication: None present Disposition:  Status is: Inpatient Remains inpatient appropriate because:   Not appropriate for discharge.  IVC   Consultants:  Podiatry Psychiatry  Procedures: None yet  Antimicrobials: As above   Subjective:  Calmer Has had less outbursts  Objective: Vitals:   03/10/22 2151 03/11/22 0810 03/11/22 1615 03/12/22 2202  BP: (!) 140/89 131/80 (!) 142/85 128/77  Pulse: 95 96  91  Resp: 16 16 18    Temp: 98.3 F (36.8 C) 98.9 F (37.2 C) 97.7 F (36.5 C) 98.7 F (37.1 C)  TempSrc: Oral Oral Oral Oral  SpO2: 90% 97%  100%  Height:        Intake/Output Summary (Last 24 hours) at 03/13/2022 1550 Last data filed at 03/13/2022 1300 Gross per 24 hour  Intake 1200 ml  Output --  Net 1200 ml    There were no vitals filed for this visit.  Examination:  Awake slight tangential  chest is clear S1-S2 no murmur Toes both feet examined today looks dry slightly hyperkeratotic and no odor    Data Reviewed: personally reviewed   CBC    Component Value Date/Time   WBC 4.9 03/09/2022 0459   RBC 3.93 03/09/2022 0459   HGB 11.1 (L) 03/09/2022 0459   HCT 33.9 (L) 03/09/2022 0459   PLT 267 03/09/2022 0459   MCV 86.3 03/09/2022 0459   MCH 28.2 03/09/2022 0459   MCHC 32.7 03/09/2022 0459   RDW 12.9 03/09/2022 0459   LYMPHSABS 2.3 03/06/2022 2111   MONOABS 0.5 03/06/2022 2111   EOSABS 0.2 03/06/2022 2111   BASOSABS 0.1 03/06/2022 2111      Latest Ref Rng & Units  03/09/2022    4:59 AM 03/08/2022    7:23 PM 03/06/2022    9:11 PM  CMP  Glucose 70 - 99 mg/dL 865  784  696   BUN 6 - 20 mg/dL 16  14  22    Creatinine 0.44 - 1.00 mg/dL  2.95  2.84   Sodium 135 - 145 mmol/L 134  137  137   Potassium 3.5 - 5.1 mmol/L 4.4  3.9  3.8   Chloride 98 - 111 mmol/L 100  103  101   CO2 22 - 32 mmol/L 26  26  23    Calcium 8.9 - 10.3 mg/dL 9.2  9.4  9.3    Total Protein 6.5 - 8.1 g/dL  6.9  7.1   Total Bilirubin 0.3 - 1.2 mg/dL  0.4  0.5   Alkaline Phos 38 - 126 U/L  94  100   AST 15 - 41 U/L  14  21   ALT 0 - 44 U/L  19  22      Radiology Studies: No results found.   Scheduled Meds:  amoxicillin-clavulanate  1 tablet Oral Q12H   insulin aspart  0-9 Units Subcutaneous TID WC   living well with diabetes book   Does not apply Once   LORazepam  2 mg Oral BID   OLANZapine zydis  10 mg Oral BID   Or   OLANZapine  10 mg Intramuscular BID   Continuous Infusions:     LOS: 4 days   Time spent: 24  1.32, MD [facility] To contact the attending provider between 7A-7P or the covering provider during after hours 7P-7A, please log into the web site [email or web address] and access using universal [place] password for that web site. If you do not have the password, please call the hospital operator.  03/13/2022, 3:50 PM

## 2022-03-13 NOTE — Inpatient Diabetes Management (Addendum)
Inpatient Diabetes Program Recommendations  AACE/ADA: New Consensus Statement on Inpatient Glycemic Control   Target Ranges:  Prepandial:   less than 140 mg/dL      Peak postprandial:   less than 180 mg/dL (1-2 hours)      Critically ill patients:  140 - 180 mg/dL    Latest Reference Range & Units 03/13/22 01:53 03/13/22 09:47  Glucose-Capillary 70 - 99 mg/dL 300 (H) 321 (H)    Latest Reference Range & Units 03/12/22 10:11 03/12/22 15:39 03/12/22 19:33 03/12/22 23:35  Glucose-Capillary 70 - 99 mg/dL 373 (H) 299 (H) 356 (H) 364 (H)    Latest Reference Range & Units 03/08/22 19:23  Hemoglobin A1C 4.8 - 5.6 % 11.3 (H)   Review of Glycemic Control  Diabetes history: DM2 Outpatient Diabetes medications: Metformin 2000 mg QAM Current orders for Inpatient glycemic control: Novolog 0-9 units TID with meals  Inpatient Diabetes Program Recommendations:    Insulin: While inpatient, please consider ordering Semglee 15 units Q24H. If patient continues to refuse insulin injections, may want to consider ordering oral DM medication, such as Metformin 100 mg BID and Amaryl 4 mg daily.  Addendum 03/13/22@13 :15-Spoke with patient at bedside. Patient was completely covered up (over head) but did take the covers off her head initially. Patient reports that she does not take any DM medications and that [place] the streets. Discussed glucose trends and A1C of 11.3%.  Patient stated that her glucose is usually "HI" on glucometer when checked and she does not know why staff is "freaking out about sugars in the 300's".  Discussed glucose and A1C goals. Explained how hyperglycemia leads to damage within blood vessels which lead to the common complications seen with uncontrolled diabetes. Patient then stated "Is that all you need to talk about? Cause I am done talking about my diabetes so you can get out of my room now."  Asked patient if she is prescribed DM medication at discharge, could she get it filled and  keep it with her so she could take it as prescribed. Patient stated, "I will try. Bye, I want you out of my room."    Thanks, Barnie Alderman, RN, MSN, [place] Diabetes Coordinator Inpatient Diabetes Program [phone] (Team Pager from 8am to [place])

## 2022-03-13 NOTE — Plan of Care (Signed)
  Problem: Education: Goal: Ability to describe self-care measures that may prevent or decrease complications (Diabetes Survival Skills Education) will improve Outcome: Progressing Goal: Individualized Educational Video(s) Outcome: Progressing   Problem: Coping: Goal: Ability to adjust to condition or change in health will improve Outcome: Progressing   

## 2022-03-14 DIAGNOSIS — F250 Schizoaffective disorder, bipolar type: Secondary | ICD-10-CM | POA: Diagnosis not present

## 2022-03-14 DIAGNOSIS — I96 Gangrene, not elsewhere classified: Secondary | ICD-10-CM | POA: Diagnosis not present

## 2022-03-14 DIAGNOSIS — F14250 Cocaine dependence with cocaine-induced psychotic disorder with delusions: Secondary | ICD-10-CM | POA: Diagnosis not present

## 2022-03-14 DIAGNOSIS — Z046 Encounter for general psychiatric examination, requested by authority: Secondary | ICD-10-CM | POA: Diagnosis not present

## 2022-03-14 DIAGNOSIS — F1420 Cocaine dependence, uncomplicated: Secondary | ICD-10-CM

## 2022-03-14 DIAGNOSIS — F29 Unspecified psychosis not due to a substance or known physiological condition: Secondary | ICD-10-CM

## 2022-03-14 LAB — GLUCOSE, CAPILLARY
Glucose-Capillary: 255 mg/dL — ABNORMAL HIGH (ref 70–99)
Glucose-Capillary: 296 mg/dL — ABNORMAL HIGH (ref 70–99)
Glucose-Capillary: 303 mg/dL — ABNORMAL HIGH (ref 70–99)
Glucose-Capillary: 355 mg/dL — ABNORMAL HIGH (ref 70–99)
Glucose-Capillary: 356 mg/dL — ABNORMAL HIGH (ref 70–99)

## 2022-03-14 MED ORDER — INSULIN GLARGINE-YFGN 100 UNIT/ML ~~LOC~~ SOLN
30.0000 [IU] | Freq: Every day | SUBCUTANEOUS | Status: DC
  Administered 2022-03-14 – 2022-03-15 (×2): 30 [IU] via SUBCUTANEOUS
  Filled 2022-03-14 (×3): qty 0.3

## 2022-03-14 MED ORDER — LORAZEPAM 1 MG PO TABS
1.0000 mg | ORAL_TABLET | Freq: Two times a day (BID) | ORAL | Status: DC
  Administered 2022-03-14 – 2022-03-15 (×2): 1 mg via ORAL
  Filled 2022-03-14 (×2): qty 1

## 2022-03-14 NOTE — Inpatient Diabetes Management (Signed)
Inpatient Diabetes Program Recommendations  AACE/ADA: New Consensus Statement on Inpatient Glycemic Control  Target Ranges:  Prepandial:   less than 140 mg/dL      Peak postprandial:   less than 180 mg/dL (1-2 hours)      Critically ill patients:  140 - 180 mg/dL    Latest Reference Range & Units 03/13/22 09:47 03/13/22 15:39 03/13/22 17:26 03/13/22 18:17 03/13/22 20:09 03/14/22 00:10 03/14/22 08:29  Glucose-Capillary 70 - 99 mg/dL 321 (H)  Novolog 7 units 457 (H)     Novolog 20 units 322 (H) 141 (H) 255 (H) 356 (H)  Novolog 9 units   Review of Glycemic Control  Diabetes history: DM2 Outpatient Diabetes medications: Metformin 2000 mg QAM Current orders for Inpatient glycemic control: Novolog 0-9 units TID with meals   Inpatient Diabetes Program Recommendations:     Insulin: Note patient refusing CBGs and Novolog insulin at times.  While inpatient, please consider ordering Semglee 15 units Q24H. If patient continues to refuse insulin injections, may want to consider ordering oral DM medication, such as Metformin 100 mg BID and Amaryl 4 mg daily.  Thanks, Barnie Alderman, RN, MSN, [place] Diabetes Coordinator Inpatient Diabetes Program [phone] (Team Pager from 8am to [place])

## 2022-03-14 NOTE — Progress Notes (Signed)
[facility] PROGRESS NOTE    Progress Note  Katherine Franklin  WVP:[id number] DOB: [birth year] DOA: 03/06/2022 PCP: Patient, No Pcp Per     Brief Narrative:   Katherine Franklin is an 56 y.o. female past medical history of schizoaffective disorder, diabetes mellitus type 2 with multiple visits to the ED for dry gangrene requiring intervention and hyperglycemia in the 400s, came into the hospital psychotic in handcuffs covered in feces found to be afebrile hemoglobin of 10.9 UDS positive for glucose, blood glucose in the 400 x-ray showed subcutaneous air loculation of gas but no osteo.  MRI did show osteo.  Patient is medically stable for transfer.   Assessment/Plan:   Schizoaffective disorder with acute psychosis/  Involuntary commitment: Probably the outburst was likely due to substance abuse. Psychiatry was consulted she has been IVC, IVC has to be renewed on 03/16/2022. Psych force met protocol status, Ativan, Benadryl, lorazepam. She will require inpatient psychiatric admission.  Dry gangrene toes of bilateral feet (HCC) Podiatrist was consulted evaluated the x-ray and the patient and relates these are not gas loculations with multiple layers of hyperkeratotic skin. Recommended Betadine of the tip of the toes follow-up with podiatry as an outpatient. She was continued on Vantin.  Diabetes mellitus type 2: Restarted back on long-acting insulin her blood glucose 300.  She is refusing meds and CBG checks.  Uncontrolled hypertension: Refusing to take oral antihypertensive medication.     DVT prophylaxis: lovenox Family Communication:none Status is: Inpatient Remains inpatient appropriate because: Remains inpatient awaiting inpatient psychiatric admission    Code Status:     Code Status Orders  (From admission, onward)           Start     Ordered   03/08/22 1522  Full code  Continuous        03/08/22 1522           Code Status History     Date Active Date  Inactive Code Status Order ID Comments User Context   03/07/2022 0212 03/08/2022 1522 Full Code [id number]  Larene Pickett, PA-C ED   01/30/2022 0727 01/30/2022 1611 Full Code [id number]  Azucena Cecil, PA-C ED   06/27/2019 1302 06/27/2019 2328 Full Code [id number]  Tegeler, Gwenyth Allegra, MD ED   06/19/2019 1057 06/19/2019 2151 Full Code [id number]  Couture, Cortni S, PA-C ED   06/17/2019 2303 06/18/2019 1723 Full Code [id number]  Veryl Speak, MD ED   06/04/2019 2103 06/05/2019 1457 Full Code [id number]  Fransico Meadow, PA-C ED   04/28/2019 0339 04/28/2019 1643 Full Code [id number]  Rozetta Nunnery, NP Inpatient   04/19/2019 0212 04/19/2019 0632 Full Code [id number]  Jani Gravel, MD ED   04/09/2019 0745 04/09/2019 1607 Full Code [id number]  Lajean Saver, MD ED   08/18/2018 1047 08/19/2018 1330 Full Code [id number]  Jola Schmidt, MD ED   06/18/2018 1439 06/19/2018 1623 Full Code [id number]  Sherwood Gambler, MD ED         IV Access:   Peripheral IV   Procedures and diagnostic studies:   No results found.   Medical Consultants:   None.   Subjective:    Katherine Franklin no complaints does not want to talk  Objective:    Vitals:   03/11/22 1615 03/12/22 2202 03/14/22 0011 03/14/22 0850  BP: (!) 142/85 128/77 116/72 117/85  Pulse:  91 90 88  Resp: _0 Temp: 97.7 F (36.5 C) 98.7 F (37.1 C)  (!) 97.5 F (36.4  C)  TempSrc: Oral Oral  Oral  SpO2:  100% 98% 97%  Height:       SpO2: 97 %   Intake/Output Summary (Last 24 hours) at 03/14/2022 1002 Last data filed at 03/14/2022 0900 Gross per 24 hour  Intake 2040 ml  Output --  Net 2040 ml   There were no vitals filed for this visit.  Exam: General exam: In no acute distress. Respiratory system: Good air movement and clear to auscultation. Cardiovascular system: S1 & S2 heard, RRR. No JVD. Gastrointestinal system: Abdomen is nondistended, soft and nontender.  Extremities: No pedal edema. Skin: No rashes, lesions or  ulcers  Data Reviewed:    Labs: Basic Metabolic Panel: Recent Labs  Lab 03/08/22 1923 03/09/22 0459  NA 137 134*  K 3.9 4.4  CL 103 100  CO2 26 26  GLUCOSE 272* 363*  BUN 14 16  CREATININE 0.86 0.98  CALCIUM 9.4 9.2   GFR CrCl cannot be calculated (Unknown ideal weight.). Liver Function Tests: Recent Labs  Lab 03/08/22 1923  AST 14*  ALT 19  ALKPHOS 94  BILITOT 0.4  PROT 6.9  ALBUMIN 3.3*   No results for input(s): "LIPASE", "AMYLASE" in the last 168 hours. No results for input(s): "AMMONIA" in the last 168 hours. Coagulation profile Recent Labs  Lab 03/08/22 1923  INR 0.9   COVID-19 Labs  No results for input(s): "DDIMER", "FERRITIN", "LDH", "CRP" in the last 72 hours.  Lab Results  Component Value Date   SARSCOV2NAA NEGATIVE 03/07/2022   SARSCOV2NAA NEGATIVE 01/30/2022   SARSCOV2NAA NEGATIVE 06/27/2019   [place] NEGATIVE 06/17/2019    CBC: Recent Labs  Lab 03/08/22 1923 03/09/22 0459  WBC 5.3 4.9  HGB 11.3* 11.1*  HCT 34.4* 33.9*  MCV 84.7 86.3  PLT 273 267   Cardiac Enzymes: No results for input(s): "CKTOTAL", "CKMB", "CKMBINDEX", "TROPONINI" in the last 168 hours. BNP (last 3 results) No results for input(s): "PROBNP" in the last 8760 hours. CBG: Recent Labs  Lab 03/13/22 1539 03/13/22 1817 03/13/22 2009 03/14/22 0010 03/14/22 0829  GLUCAP 457* 322* 141* 255* 356*   D-Dimer: No results for input(s): "DDIMER" in the last 72 hours. Hgb A1c: No results for input(s): "HGBA1C" in the last 72 hours. Lipid Profile: No results for input(s): "CHOL", "HDL", "LDLCALC", "TRIG", "CHOLHDL", "LDLDIRECT" in the last 72 hours. Thyroid function studies: No results for input(s): "TSH", "T4TOTAL", "T3FREE", "THYROIDAB" in the last 72 hours.  Invalid input(s): "FREET3" Anemia work up: No results for input(s): "VITAMINB12", "FOLATE", "FERRITIN", "TIBC", "IRON", "RETICCTPCT" in the last 72 hours. Sepsis Labs: Recent Labs  Lab 03/08/22 1923  03/09/22 0459  WBC 5.3 4.9  LATICACIDVEN 1.3  --    Microbiology Recent Results (from the past 240 hour(s))  Resp Panel by RT-PCR (Flu A&B, Covid) Anterior Nasal Swab     Status: None   Collection Time: 03/07/22  2:13 AM   Specimen: Anterior Nasal Swab  Result Value Ref Range Status   SARS Coronavirus 2 by RT PCR NEGATIVE NEGATIVE Final    Comment: (NOTE) SARS-CoV-2 target nucleic acids are NOT DETECTED.  The SARS-CoV-2 RNA is generally detectable in upper respiratory specimens during the acute phase of infection. The lowest concentration of SARS-CoV-2 viral copies this assay can detect is 138 copies/mL. A negative result does not preclude SARS-Cov-2 infection and should not be used as the sole basis for treatment or other patient management decisions. A negative result may occur with  improper specimen collection/handling, submission of  specimen other than nasopharyngeal swab, presence of viral mutation(s) within the areas targeted by this assay, and inadequate number of viral copies(<138 copies/mL). A negative result must be combined with clinical observations, patient history, and epidemiological information. The expected result is Negative.  Fact Sheet for Patients:  EntrepreneurPulse.com.au  Fact Sheet for Healthcare Providers:  IncredibleEmployment.be  This test is no t yet approved or cleared by the [place] FDA and  has been authorized for detection and/or diagnosis of SARS-CoV-2 by FDA under an Emergency Use Authorization (EUA). This EUA will remain  in effect (meaning this test can be used) for the duration of the COVID-19 declaration under Section 564(b)(1) of the Act, 21 U.S.C.section 360bbb-3(b)(1), unless the authorization is terminated  or revoked sooner.       Influenza A by PCR NEGATIVE NEGATIVE Final   Influenza B by PCR NEGATIVE NEGATIVE Final    Comment: (NOTE) The Xpert Xpress SARS-CoV-2/FLU/RSV plus assay is  intended as an aid in the diagnosis of influenza from Nasopharyngeal swab specimens and should not be used as a sole basis for treatment. Nasal washings and aspirates are unacceptable for Xpert Xpress SARS-CoV-2/FLU/RSV testing.  Fact Sheet for Patients: EntrepreneurPulse.com.au  Fact Sheet for Healthcare Providers: IncredibleEmployment.be  This test is not yet approved or cleared by the [place] FDA and has been authorized for detection and/or diagnosis of SARS-CoV-2 by FDA under an Emergency Use Authorization (EUA). This EUA will remain in effect (meaning this test can be used) for the duration of the COVID-19 declaration under Section 564(b)(1) of the Act, 21 U.S.C. section 360bbb-3(b)(1), unless the authorization is terminated or revoked.  Performed at [facility], [place] [address] [place], [place] [address]      Medications:    insulin aspart  0-9 Units Subcutaneous TID WC   living well with diabetes book   Does not apply Once   LORazepam  1 mg Oral BID   OLANZapine zydis  10 mg Oral BID   Or   OLANZapine  10 mg Intramuscular BID   Continuous Infusions:    LOS: 5 days   Charlynne Cousins  [facility]  03/14/2022, 10:02 AM

## 2022-03-14 NOTE — TOC Progression Note (Addendum)
Transition of Care [facility]) - Progression Note    Patient Details  Name: Katherine Franklin MRN: [id number] Date of Birth: March 28, 1966  [facility]) CM/SW Contact  Joanne Chars, LCSW Phone Number: 03/14/2022, 2:48 PM  Clinical Narrative:    Referral re-faxed to following facilities: [facility]       [id number]: IVC paperwork redone, signed, faxed to magistrate.  Custody order received and placed on chart.  [facility] called and will send officer over to complete service.  Expected Discharge Plan: Psychiatric Hospital Barriers to Discharge: Psych Bed not available  Expected Discharge Plan and Services Expected Discharge Plan: [place] arrangements for the past 2 months: Homeless                                       Social Determinants of Health (SDOH) Interventions    Readmission Risk Interventions     No data to display

## 2022-03-14 NOTE — Consult Note (Addendum)
[facility]ry Follow Up Face-to-Face Psychiatric Evaluation   Service Date: March 14, 2022 LOS:  LOS: 5 days    Assessment  Katherine Franklin is a 56 y.o. female admitted medically for 03/06/2022 10:48 PM for psychotic behavior, brought in by EMS and found to have concern for gas forming infection in bilateral lower extremities. She carries the psychiatric diagnoses of schizoaffective disorder, cocaine abuse, PTSD and has a past medical history of  hypertension and uncontrolled diabetes.Psychiatry was consulted for "PSYCHOSIS, AGGRESSION, AND AGITATION BEING MEDICALLY ADMITTED" by [facility].   She is under IVC and will benefit from inpatient psychiatric admission based on initial assessment given her active risk of harm to self and others. MEDICALLY STABLE per Dr. Mahala Menghini. Requires inpatient psychiatry ASAP.   The patient has been placed on a forced medication protocol (details below) but has complied with oral medications (Ativan and Zyprexa) for the past 2 days. She has been intermittently refusing CBG checks and SSI.   She has not required PRN medication in several days but remains difficult to manage per reports from her nurses.  On interview she quests discharge and appears more logical than what is previously documented. She exhibits labile affect and still appears somewhat erratic. Ongoing discussion with [facility] regarding admission to psych hospital.   Please see plan below for detailed recommendations.   Diagnoses:  Active Hospital problems: Principal Problem:   Dry gangrene toes of bilateral feet (HCC) Active Problems:   Cocaine dependence (HCC)   Substance induced mood disorder (HCC)   Psychosis (HCC)   Type 2 diabetes mellitus (HCC)   Gangrene (HCC)   Involuntary commitment     Plan  ## Safety and Observation Level:  - Based on my clinical evaluation, I estimate the patient to be at high risk of self harm in the current setting - Continues to be on 1:1  due to severe agitation.  - Requires non-violent restraint at this time due to elopement risk   ##Schizoaffective Disorder ##Substance induced Psychotic Disorder ##Cocaine Use Disorder Forced medication protocol (initiated 03/09/22) --Continue Zyprexa 10 mg BID PO or IM --Discontinued daily ECG as her [place] has been relatively stable and has not required multiple PRNs --Requires renewal every 7 days (next due: 03/16/22) --Continue Agitation Protocol: Haldol 5 mg + Benadryl 50 mg + Ativan 2 mg q6h  Patient is also on PO Ativan (not a forced med). -Reduce Ativan from 2 mg BID to 1 mg BID given improvement in agitation   ## Medical Decision Making Capacity:  Not formally assessed today  ## Further Work-up:  -MEDICALLY CLEARED  -- most recent EKG on 03/11/22 had QtC of 447 -- Pertinent labwork reviewed earlier this admission includes:   -HGB 10.9  -BG 338  -UDS pending from this hospitalization   - TSH 0.19  -A1c 11.3  ## Disposition:  [facility] admission coordinator reviewing  ## Behavioral / Environmental:  -- Patient has been aggressive with staff and attempted to elope multiple times (see separate nursing note for further details)  ##Legal Status --Under IVC  Thank you for this consult request. Recommendations have been communicated to the primary team.  We will follow at this time.   Carlyn Reichert, MD   NEW history  Relevant Aspects of Hospital Course:  Admitted on 03/06/2022 for psychotic behavior brought in by EMS, found to have bilateral lower extremities infection in the setting of uncontrolled diabetes.  Patient Report:  Per chart review, the patient was also seen at the  ED 10/4 (hyperglycemia), 10/7 (HI and detox request), 10/11 (hyperglycemia, homelessness), 10/14 (concern for drug overdose), 10/16 (voluntary transfer to [facility], discharged with resources, not eligible for [facility] for insurance), 10/20 (requesting BG check), 10/22 (agitated behavior), 10/23 (agitated  behavior),  10/24 (eating feces).  Patient exhibits a relatively logical thought process today. Her affect is noted to be labile. She states that her SSI check has arrived and states that she wants to go get a room somewhere. She perseverates on the rooms being filled up by the time she is discharged. Discussed with her that we are looking for an inpatient psychiatric bed.  ROS:  Unable to assess due to lability   Psychiatric History:  Information collected from chart: Multiple ED visits, hx of schizoaffective disorder and cocaine use disorder   Social History:  Tobacco use: unknown Alcohol use: unknown Drug use: UDS positive for cocaine last ED visit  Family History:  The patient's Family history is unknown by patient.  Medical History: Past Medical History:  Diagnosis Date   Diabetes mellitus without complication ([place])    Drug abuse ([place]) 09/22/2018   GERD (gastroesophageal reflux disease)    Hypertension    PTSD (post-traumatic stress disorder)    Schizo affective schizophrenia ([place])     Surgical History: Past Surgical History:  Procedure Laterality Date   SKIN GRAFT     TUBAL LIGATION      Medications:   Current Facility-Administered Medications:    acetaminophen (TYLENOL) tablet 650 mg, 650 mg, Oral, Q6H PRN **OR** acetaminophen (TYLENOL) suppository 650 mg, 650 mg, Rectal, Q6H PRN, Orma Flaming, MD   haloperidol lactate (HALDOL) injection 5 mg, 5 mg, Intramuscular, Q6H PRN, 5 mg at 03/11/22 1544 **AND** diphenhydrAMINE (BENADRYL) injection 50 mg, 50 mg, Intramuscular, Q6H PRN, 50 mg at 03/11/22 1544 **AND** LORazepam (ATIVAN) injection 2 mg, 2 mg, Intramuscular, Q6H PRN, [name], MD, 2 mg at 03/11/22 1544   insulin aspart (novoLOG) injection 0-9 Units, 0-9 Units, Subcutaneous, TID WC, Orma Flaming, MD, 9 Units at 03/14/22 7371   living well with diabetes book MISC, , Does not apply, Once, Nita Sells, MD   LORazepam (ATIVAN) tablet 2 mg, 2 mg, Oral,  BID, 2 mg at 03/14/22 0841 **OR** [DISCONTINUED] LORazepam (ATIVAN) injection 2 mg, 2 mg, Intramuscular, BID, [name], MD, 2 mg at 03/11/22 2247   OLANZapine zydis (ZYPREXA) disintegrating tablet 10 mg, 10 mg, Oral, BID, 10 mg at 03/14/22 0841 **OR** OLANZapine (ZYPREXA) injection 10 mg, 10 mg, Intramuscular, BID, [name], MD, 10 mg at 03/11/22 2248  Allergies: Allergies  Allergen Reactions   Sulfa Antibiotics Shortness Of Breath, Rash and Swelling   Latex Rash       Objective  Vital signs:  Temp:  [97.5 F (36.4 C)] 97.5 F (36.4 C) (11/01 0850) Pulse Rate:  [88-90] 88 (11/01 0850) Resp:  [18-19] 19 (11/01 0850) BP: (116-117)/(72-85) 117/85 (11/01 0850) SpO2:  [97 %-98 %] 97 % (11/01 0850)  Psychiatric Specialty Exam:  Presentation  General Appearance:  Disheveled  Eye Contact: None  Speech: Pressured  Speech Volume: Increased  Handedness: Right   Mood and Affect  Mood: Angry; Labile  Affect: Labile   Thought Process  Thought Processes: Logical  Descriptions of Associations: inact  Orientation: full Thought Content: Patient denied SI/HI/AVH, delusions, paranoia, first rank symptoms. Patient is not grossly responding to internal/external stimuli on exam and did not make delusional statements.  History of Schizophrenia/Schizoaffective disorder:  Reported in chart review Duration of Psychotic Symptoms: >  6 months Hallucinations: none  Ideas of Reference: none Suicidal Thoughts:denies  Homicidal Thoughts:denies   Sensorium  Memory: fair  Judgment: Poor   Insight: poor  Executive Functions  Concentration: Poor  Attention Span: Poor  Recall: Poor  Fund of Knowledge: Poor  Language: Poor   Psychomotor Activity  Psychomotor Activity: No data recorded   Assets  Assets: Communication Skills; Resilience   Sleep  Sleep: fair    Physical Exam Constitutional:      Appearance: the patient is not toxic-appearing.   Pulmonary:     Effort: Pulmonary effort is normal.  Neurological:     General: No focal deficit present.     Mental Status: the patient is alert and oriented to person, place, and time.   Review of Systems  Respiratory:  Negative for shortness of breath.   Cardiovascular:  Negative for chest pain.  Gastrointestinal:  Negative for abdominal pain, constipation, diarrhea, nausea and vomiting.  Neurological:  Negative for headaches.   Blood pressure 117/85, pulse 88, temperature (!) 97.5 F (36.4 C), temperature source Oral, resp. rate 19, height 5\' 9"  (1.753 m), SpO2 97 %. Body mass index is 35.44 kg/m.    , MD PGY-2

## 2022-03-15 DIAGNOSIS — F14250 Cocaine dependence with cocaine-induced psychotic disorder with delusions: Secondary | ICD-10-CM | POA: Diagnosis not present

## 2022-03-15 DIAGNOSIS — F250 Schizoaffective disorder, bipolar type: Secondary | ICD-10-CM | POA: Diagnosis not present

## 2022-03-15 DIAGNOSIS — I96 Gangrene, not elsewhere classified: Secondary | ICD-10-CM | POA: Diagnosis not present

## 2022-03-15 DIAGNOSIS — Z046 Encounter for general psychiatric examination, requested by authority: Secondary | ICD-10-CM | POA: Diagnosis not present

## 2022-03-15 LAB — BASIC METABOLIC PANEL
Anion gap: 9 (ref 5–15)
BUN: 21 mg/dL — ABNORMAL HIGH (ref 6–20)
CO2: 27 mmol/L (ref 22–32)
Calcium: 9.6 mg/dL (ref 8.9–10.3)
Chloride: 100 mmol/L (ref 98–111)
Creatinine, Ser: 0.83 mg/dL (ref 0.44–1.00)
GFR, Estimated: >60 mL/min (ref >60)
Glucose, Bld: 279 mg/dL — ABNORMAL HIGH (ref 70–99)
Potassium: 4.4 mmol/L (ref 3.5–5.1)
Sodium: 136 mmol/L (ref 135–145)

## 2022-03-15 LAB — GLUCOSE, CAPILLARY
Glucose-Capillary: 407 mg/dL — ABNORMAL HIGH (ref 70–99)
Glucose-Capillary: 319 mg/dL — ABNORMAL HIGH (ref 70–99)
Glucose-Capillary: 110 mg/dL — ABNORMAL HIGH (ref 70–99)
Glucose-Capillary: 253 mg/dL — ABNORMAL HIGH (ref 70–99)

## 2022-03-15 MED ORDER — INSULIN ASPART 100 UNIT/ML IJ SOLN
0.0000 [IU] | Freq: Three times a day (TID) | INTRAMUSCULAR | Status: DC
  Administered 2022-03-15: 11 [IU] via SUBCUTANEOUS
  Administered 2022-03-16: 8 [IU] via SUBCUTANEOUS
  Administered 2022-03-16: 5 [IU] via SUBCUTANEOUS

## 2022-03-15 MED ORDER — INSULIN ASPART 100 UNIT/ML IJ SOLN
0.0000 [IU] | Freq: Every day | INTRAMUSCULAR | Status: DC
  Administered 2022-03-15: 3 [IU] via SUBCUTANEOUS

## 2022-03-15 MED ORDER — INSULIN ASPART 100 UNIT/ML IJ SOLN
3.0000 [IU] | Freq: Three times a day (TID) | INTRAMUSCULAR | Status: DC
  Administered 2022-03-15 – 2022-03-16 (×4): 3 [IU] via SUBCUTANEOUS

## 2022-03-15 MED ORDER — LORAZEPAM 1 MG PO TABS
1.0000 mg | ORAL_TABLET | Freq: Every day | ORAL | Status: DC
  Administered 2022-03-16: 1 mg via ORAL
  Filled 2022-03-15: qty 1

## 2022-03-15 MED ORDER — LORAZEPAM 1 MG PO TABS: 1.0000 mg | ORAL_TABLET | Freq: Every day | ORAL | Status: DC

## 2022-03-15 NOTE — Consult Note (Signed)
[facility] Follow Up Face-to-Face Psychiatric Evaluation   Service Date: March 15, 2022 LOS:  LOS: 6 days    Assessment  [facility] is a 56 y.o. female admitted medically for 03/06/2022 10:48 PM for psychotic behavior, brought in by EMS and found to have concern for gas forming infection in bilateral lower extremities. She carries the psychiatric diagnoses of schizoaffective disorder, cocaine abuse, PTSD and has a past medical history of  hypertension and uncontrolled diabetes.Psychiatry was consulted for "PSYCHOSIS, AGGRESSION, AND AGITATION BEING MEDICALLY ADMITTED" by [facility].   The patient has been placed on a forced medication protocol (details below) but has complied with oral medications (Ativan and Zyprexa) for the past 3 days. She has been intermittently refusing CBG checks and SSI. She has demonstrated improved behavior and exhibits a logical thought process, albeit, she does still have persecutory delusions involving staff members. She has outlined a logical plan for discharge involving her picking up her disability check and finding a room. Patient may be able to discharge tomorrow.   Please see plan below for detailed recommendations.   Diagnoses:  Active Hospital problems: Principal Problem:   Dry gangrene toes of bilateral feet (HCC) Active Problems:   Cocaine dependence (HCC)   Substance induced mood disorder (HCC)   Psychosis (HCC)   Type 2 diabetes mellitus (HCC)   Gangrene (HCC)   Involuntary commitment     Plan  ## Safety and Observation Level:  - Based on my clinical evaluation, I estimate the patient to be at high risk of self harm in the current setting - Continues to be on 1:1 due to severe agitation.  - Requires non-violent restraint at this time due to elopement risk   ##Schizoaffective Disorder ##Substance induced Psychotic Disorder ##Cocaine Use Disorder Forced medication protocol (initiated 03/09/22) --Continue Zyprexa  10 mg BID PO or IM --Discontinued daily ECG as her Burnard Leigh has been relatively stable and has not required multiple PRNs --Requires renewal every 7 days (next due: 03/16/22) --Continue Agitation Protocol: Haldol 5 mg + Benadryl 50 mg + Ativan 2 mg q6h  Will likely discontinue forced medication protocol tomorrow.   Patient is also on PO Ativan (not a forced med). -Reduce Ativan from 1 mg BID to 1 mg daily given improvement in agitation   ## Medical Decision Making Capacity:  Not formally assessed today  ## Further Work-up:  -MEDICALLY CLEARED  -- most recent EKG on 03/11/22 had QtC of 447 -- Pertinent labwork reviewed earlier this admission includes:   -HGB 10.9  -BG 338  -UDS pending from this hospitalization   - TSH 0.19  -A1c 11.3  ## Disposition:  -likely discharge tomorrow  ## Behavioral / Environmental:   ##Legal Status --Under IVC  Thank you for this consult request. Recommendations have been communicated to the primary team.  We will follow at this time.   Carlyn Reichert, MD   NEW history  Relevant Aspects of Hospital Course:  Admitted on 03/06/2022 for psychotic behavior brought in by EMS, found to have bilateral lower extremities infection in the setting of uncontrolled diabetes.  Patient Report:  Per chart review, the patient was also seen at the ED 10/4 (hyperglycemia), 10/7 (HI and detox request), 10/11 (hyperglycemia, homelessness), 10/14 (concern for drug overdose), 10/16 (voluntary transfer to [facility], discharged with resources, not eligible for [facility] for insurance), 10/20 (requesting BG check), 10/22 (agitated behavior), 10/23 (agitated behavior),  10/24 (eating feces).  Patient exhibits a relatively logical thought process today. Her affect is  noted to be labile. She states that her SSI check has arrived and states that she wants to go get a room somewhere. She perseverates on the rooms being filled up by the time she is discharged. Discussed with her that we  are looking for an inpatient psychiatric bed.  ROS:  Unable to assess due to lability   Psychiatric History:  Information collected from chart: Multiple ED visits, hx of schizoaffective disorder and cocaine use disorder   Social History:  Tobacco use: unknown Alcohol use: unknown Drug use: UDS positive for cocaine last ED visit  Family History:  The patient's Family history is unknown by patient.  Medical History: Past Medical History:  Diagnosis Date   Diabetes mellitus without complication (HCC)    Drug abuse (HCC) 09/22/2018   GERD (gastroesophageal reflux disease)    Hypertension    PTSD (post-traumatic stress disorder)    Schizo affective schizophrenia (HCC)     Surgical History: Past Surgical History:  Procedure Laterality Date   SKIN GRAFT     TUBAL LIGATION      Medications:   Current Facility-Administered Medications:    acetaminophen (TYLENOL) tablet 650 mg, 650 mg, Oral, Q6H PRN **OR** acetaminophen (TYLENOL) suppository 650 mg, 650 mg, Rectal, Q6H PRN, Orland Mustard, MD   haloperidol lactate (HALDOL) injection 5 mg, 5 mg, Intramuscular, Q6H PRN, 5 mg at 03/11/22 1544 **AND** diphenhydrAMINE (BENADRYL) injection 50 mg, 50 mg, Intramuscular, Q6H PRN, 50 mg at 03/11/22 1544 **AND** LORazepam (ATIVAN) injection 2 mg, 2 mg, Intramuscular, Q6H PRN, Park Pope, MD, 2 mg at 03/11/22 1544   insulin aspart (novoLOG) injection 0-15 Units, 0-15 Units, Subcutaneous, TID WC, Marinda Elk, MD, 11 Units at 03/15/22 1241   insulin aspart (novoLOG) injection 0-5 Units, 0-5 Units, Subcutaneous, QHS, Marinda Elk, MD   insulin aspart (novoLOG) injection 3 Units, 3 Units, Subcutaneous, TID WC, Marinda Elk, MD, 3 Units at 03/15/22 1803   insulin glargine-yfgn (SEMGLEE) injection 30 Units, 30 Units, Subcutaneous, Daily, Marinda Elk, MD, 30 Units at 03/15/22 0902   living well with diabetes book MISC, , Does not apply, Once, Rhetta Mura,  MD   LORazepam (ATIVAN) tablet 1 mg, 1 mg, Oral, BID, 1 mg at 03/15/22 0854 **OR** [DISCONTINUED] LORazepam (ATIVAN) injection 2 mg, 2 mg, Intramuscular, BID, Park Pope, MD, 2 mg at 03/11/22 2247   OLANZapine zydis (ZYPREXA) disintegrating tablet 10 mg, 10 mg, Oral, BID, 10 mg at 03/15/22 0854 **OR** OLANZapine (ZYPREXA) injection 10 mg, 10 mg, Intramuscular, BID, Park Pope, MD, 10 mg at 03/11/22 2248  Allergies: Allergies  Allergen Reactions   Sulfa Antibiotics Shortness Of Breath, Rash and Swelling   Latex Rash       Objective  Vital signs:  Temp:  [97.9 F (36.6 C)-98 F (36.7 C)] 98 F (36.7 C) (11/02 1347) Pulse Rate:  [86-87] 86 (11/02 1347) Resp:  [17-19] 17 (11/02 1347) BP: (117-129)/(73-74) 129/73 (11/02 1347) SpO2:  [97 %-98 %] 97 % (11/02 1347)  Psychiatric Specialty Exam:  Presentation  General Appearance:  Disheveled  Eye Contact: None  Speech: Pressured  Speech Volume: Increased  Handedness: Right   Mood and Affect  Mood: At times angry  Affect: Somewhat labile  Thought Process  Thought Processes: Logical  Descriptions of Associations: inact  Orientation: full Thought Content: Patient denied SI/HI/AVH, delusions, paranoia, first rank symptoms. Patient is not grossly responding to internal/external stimuli on exam  History of Schizophrenia/Schizoaffective disorder:  Reported in chart review Duration of  Psychotic Symptoms: >6 months Hallucinations: none  Ideas of Reference: none Suicidal Thoughts:denies  Homicidal Thoughts:denies   Sensorium  Memory: fair  Judgment: Poor   Insight: poor  Executive Functions  Concentration: Poor  Attention Span: Poor  Recall: Poor  Fund of Knowledge: Poor  Language: Poor   Psychomotor Activity  Psychomotor Activity: No data recorded   Assets  Assets: Communication Skills; Resilience   Sleep  Sleep: fair    Physical Exam Constitutional:      Appearance: the  patient is not toxic-appearing.  Pulmonary:     Effort: Pulmonary effort is normal.  Neurological:     General: No focal deficit present.     Mental Status: the patient is alert and oriented to person, place, and time.   Review of Systems  Respiratory:  Negative for shortness of breath.   Cardiovascular:  Negative for chest pain.  Gastrointestinal:  Negative for abdominal pain, constipation, diarrhea, nausea and vomiting.  Neurological:  Negative for headaches.   Blood pressure 129/73, pulse 86, temperature 98 F (36.7 C), temperature source Oral, resp. rate 17, height 5\' 9"  (1.753 m), SpO2 97 %. Body mass index is 35.44 kg/m.    Corky Sox, MD PGY-2

## 2022-03-15 NOTE — Plan of Care (Signed)
  Problem: Education: Goal: Ability to describe self-care measures that may prevent or decrease complications (Diabetes Survival Skills Education) will improve Outcome: Progressing Goal: Individualized Educational Video(s) Outcome: Progressing   Problem: Coping: Goal: Ability to adjust to condition or change in health will improve Outcome: Progressing   

## 2022-03-15 NOTE — Progress Notes (Signed)
[facility] PROGRESS NOTE    Progress Note  K[facility]  OEU:[id number] DOB: 1965-10-28 DOA: 03/06/2022 PCP: Patient, No Pcp Per     Brief Narrative:   [facility]atherine [facility] is an 56 y.o. female past medical history of schizoaffective disorder, diabetes mellitus type 2 with multiple visits to the ED for dry gangrene requiring intervention and hyperglycemia in the 400s, came into the hospital psychotic in handcuffs covered in feces found to be afebrile hemoglobin of 10.9 UDS positive for glucose, blood glucose in the 400 x-ray showed subcutaneous air loculation of gas but no osteo.  MRI did show osteo.  Patient is medically stable for transfer.   Assessment/Plan:   Schizoaffective disorder with acute psychosis/  Involuntary commitment: Probably the outburst was likely due to substance abuse. Psychiatry was consulted she has been IVC, IVC has to be renewed on 03/16/2022. Psych force met protocol status, Ativan, Benadryl, lorazepam. She will require inpatient psychiatric admission. Patient is medically stable to transfer to inpatient psychiatric facility.  Dry gangrene toes of bilateral feet (HCC) Podiatrist was consulted evaluated the x-ray and the patient and relates these are not gas loculations with multiple layers of hyperkeratotic skin. Recommended Betadine of the tip of the toes follow-up with podiatry as an outpatient. She was continued on Vantin.  Diabetes mellitus type 2: Restarted back on long-acting insulin her blood glucose 300.  She is refusing meds and CBG checks.  Uncontrolled hypertension: Refusing to take oral antihypertensive medication.     DVT prophylaxis: lovenox Family Communication:none Status is: Inpatient Remains inpatient appropriate because: Remains inpatient awaiting inpatient psychiatric admission    Code Status:     Code Status Orders  (From admission, onward)           Start     Ordered   03/08/22 1522  Full code  Continuous         03/08/22 1522           Code Status History     Date Active Date Inactive Code Status Order ID Comments User Context   03/07/2022 0212 03/08/2022 1522 Full Code [id number]  Larene Pickett, PA-C ED   01/30/2022 0727 01/30/2022 1611 Full Code [id number]  Azucena Cecil, PA-C ED   06/27/2019 1302 06/27/2019 2328 Full Code [id number]  Tegeler, Gwenyth Allegra, MD ED   06/19/2019 1057 06/19/2019 2151 Full Code [id number]  Couture, Cortni S, PA-C ED   06/17/2019 2303 06/18/2019 1723 Full Code [id number]  Veryl Speak, MD ED   06/04/2019 2103 06/05/2019 1457 Full Code [id number]  Fransico Meadow, PA-C ED   04/28/2019 0339 04/28/2019 1643 Full Code [id number]  Rozetta Nunnery, NP Inpatient   04/19/2019 0212 04/19/2019 0632 Full Code [id number]  Jani Gravel, MD ED   04/09/2019 0745 04/09/2019 1607 Full Code [id number]  Lajean Saver, MD ED   08/18/2018 1047 08/19/2018 1330 Full Code [id number]  Jola Schmidt, MD ED   06/18/2018 1439 06/19/2018 1623 Full Code [id number]  Sherwood Gambler, MD ED         IV Access:   Peripheral IV   Procedures and diagnostic studies:   No results found.   Medical Consultants:   None.   Subjective:    Rhianna Raulerson does not want to talk today.  Objective:    Vitals:   03/12/22 2202 03/14/22 0011 03/14/22 0850 03/14/22 2317  BP: 128/77 116/72 117/85 117/74  Pulse: 91 90 88 87  Resp:  _0 Temp: 98.7 F (37.1 C)  (!)  97.5 F (36.4 C) 97.9 F (36.6 C)  TempSrc: Oral  Oral Oral  SpO2: 100% 98% 97% 98%  Height:       SpO2: 98 %   Intake/Output Summary (Last 24 hours) at 03/15/2022 1041 Last data filed at 03/15/2022 0900 Gross per 24 hour  Intake 1200 ml  Output --  Net 1200 ml    There were no vitals filed for this visit.  Exam: General exam: In no acute distress. Respiratory system: Good air movement and clear to auscultation. Cardiovascular system: S1 & S2 heard, RRR. No JVD. Gastrointestinal system: Abdomen is nondistended, soft and nontender.   Extremities: No pedal edema. Skin: No rashes, lesions or ulcers  Data Reviewed:    Labs: Basic Metabolic Panel: Recent Labs  Lab 03/08/22 1923 03/09/22 0459  NA 137 134*  K 3.9 4.4  CL 103 100  CO2 26 26  GLUCOSE 272* 363*  BUN 14 16  CREATININE 0.86 0.98  CALCIUM 9.4 9.2    GFR CrCl cannot be calculated (Unknown ideal weight.). Liver Function Tests: Recent Labs  Lab 03/08/22 1923  AST 14*  ALT 19  ALKPHOS 94  BILITOT 0.4  PROT 6.9  ALBUMIN 3.3*    No results for input(s): "LIPASE", "AMYLASE" in the last 168 hours. No results for input(s): "AMMONIA" in the last 168 hours. Coagulation profile Recent Labs  Lab 03/08/22 1923  INR 0.9    COVID-19 Labs  No results for input(s): "DDIMER", "FERRITIN", "LDH", "CRP" in the last 72 hours.  Lab Results  Component Value Date   SARSCOV2NAA NEGATIVE 03/07/2022   SARSCOV2NAA NEGATIVE 01/30/2022   SARSCOV2NAA NEGATIVE 06/27/2019   [place] NEGATIVE 06/17/2019    CBC: Recent Labs  Lab 03/08/22 1923 03/09/22 0459  WBC 5.3 4.9  HGB 11.3* 11.1*  HCT 34.4* 33.9*  MCV 84.7 86.3  PLT 273 267    Cardiac Enzymes: No results for input(s): "CKTOTAL", "CKMB", "CKMBINDEX", "TROPONINI" in the last 168 hours. BNP (last 3 results) No results for input(s): "PROBNP" in the last 8760 hours. CBG: Recent Labs  Lab 03/14/22 0829 03/14/22 1134 03/14/22 1652 03/14/22 2315 03/15/22 0900  GLUCAP 356* 296* 355* 303* 407*    D-Dimer: No results for input(s): "DDIMER" in the last 72 hours. Hgb A1c: No results for input(s): "HGBA1C" in the last 72 hours. Lipid Profile: No results for input(s): "CHOL", "HDL", "LDLCALC", "TRIG", "CHOLHDL", "LDLDIRECT" in the last 72 hours. Thyroid function studies: No results for input(s): "TSH", "T4TOTAL", "T3FREE", "THYROIDAB" in the last 72 hours.  Invalid input(s): "FREET3" Anemia work up: No results for input(s): "VITAMINB12", "FOLATE", "FERRITIN", "TIBC", "IRON", "RETICCTPCT"  in the last 72 hours. Sepsis Labs: Recent Labs  Lab 03/08/22 1923 03/09/22 0459  WBC 5.3 4.9  LATICACIDVEN 1.3  --     Microbiology Recent Results (from the past 240 hour(s))  Resp Panel by RT-PCR (Flu A&B, Covid) Anterior Nasal Swab     Status: None   Collection Time: 03/07/22  2:13 AM   Specimen: Anterior Nasal Swab  Result Value Ref Range Status   SARS Coronavirus 2 by RT PCR NEGATIVE NEGATIVE Final    Comment: (NOTE) SARS-CoV-2 target nucleic acids are NOT DETECTED.  The SARS-CoV-2 RNA is generally detectable in upper respiratory specimens during the acute phase of infection. The lowest concentration of SARS-CoV-2 viral copies this assay can detect is 138 copies/mL. A negative result does not preclude SARS-Cov-2 infection and should not be used as the sole basis for treatment or other patient  management decisions. A negative result may occur with  improper specimen collection/handling, submission of specimen other than nasopharyngeal swab, presence of viral mutation(s) within the areas targeted by this assay, and inadequate number of viral copies(<138 copies/mL). A negative result must be combined with clinical observations, patient history, and epidemiological information. The expected result is Negative.  Fact Sheet for Patients:  EntrepreneurPulse.com.au  Fact Sheet for Healthcare Providers:  IncredibleEmployment.be  This test is no t yet approved or cleared by the [place] FDA and  has been authorized for detection and/or diagnosis of SARS-CoV-2 by FDA under an Emergency Use Authorization (EUA). This EUA will remain  in effect (meaning this test can be used) for the duration of the COVID-19 declaration under Section 564(b)(1) of the Act, 21 U.S.C.section 360bbb-3(b)(1), unless the authorization is terminated  or revoked sooner.       Influenza A by PCR NEGATIVE NEGATIVE Final   Influenza B by PCR NEGATIVE NEGATIVE Final     Comment: (NOTE) The Xpert Xpress SARS-CoV-2/FLU/RSV plus assay is intended as an aid in the diagnosis of influenza from Nasopharyngeal swab specimens and should not be used as a sole basis for treatment. Nasal washings and aspirates are unacceptable for Xpert Xpress SARS-CoV-2/FLU/RSV testing.  Fact Sheet for Patients: EntrepreneurPulse.com.au  Fact Sheet for Healthcare Providers: IncredibleEmployment.be  This test is not yet approved or cleared by the [place] FDA and has been authorized for detection and/or diagnosis of SARS-CoV-2 by FDA under an Emergency Use Authorization (EUA). This EUA will remain in effect (meaning this test can be used) for the duration of the COVID-19 declaration under Section 564(b)(1) of the Act, 21 U.S.C. section 360bbb-3(b)(1), unless the authorization is terminated or revoked.  Performed at [facility], [place] [address], [place], [place] [address]      Medications:    insulin aspart  0-15 Units Subcutaneous TID WC   insulin aspart  0-5 Units Subcutaneous QHS   insulin aspart  3 Units Subcutaneous TID WC   insulin glargine-yfgn  30 Units Subcutaneous Daily   living well with diabetes book   Does not apply Once   LORazepam  1 mg Oral BID   OLANZapine zydis  10 mg Oral BID   Or   OLANZapine  10 mg Intramuscular BID   Continuous Infusions:    LOS: 6 days   Charlynne Cousins  [facility]  03/15/2022, 10:41 AM

## 2022-03-15 NOTE — TOC Progression Note (Signed)
Transition of Care [facility]) - Progression Note    Patient Details  Name: Katherine Franklin MRN: [id number] Date of Birth: Nov 23, 1965  Transition of Care [facility] CM/SW Contact  Joanne Chars, LCSW Phone Number: 03/15/2022, 2:58 PM  Clinical Narrative:   CSW met with pt regarding housing options.  Pt reports she gets $821 disability money per month, check came yesterday and goes to [facility] [employer] in [place], [phone].  Pt reports that she can only pick up the check on Tues, Wed, or Thurs.  If she discharges tomorrow, she will have to wait until next Tuesday.  Pt reports that she has been staying on the street, but does have a potential boarding house room--she has one option in [place] and one option in [place].  She can get in with the current disability check.  Discussed what she would do if she has to wait until Tuesday to get it and she said she can panhandle enough to get a hotel room until then.  CSW called [facility], [facility], to see if there was a way for pt to get the check outside of the usual hours.    Expected Discharge Plan: Home/Self Care Barriers to Discharge: Other (must enter comment) (not psych cleared)  Expected Discharge Plan and Services Expected Discharge Plan: Home/Self Care       Living arrangements for the past 2 months: Homeless                                       Social Determinants of Health (SDOH) Interventions    Readmission Risk Interventions     No data to display

## 2022-03-16 ENCOUNTER — Other Ambulatory Visit (HOSPITAL_COMMUNITY): Payer: Self-pay

## 2022-03-16 DIAGNOSIS — I96 Gangrene, not elsewhere classified: Secondary | ICD-10-CM | POA: Diagnosis not present

## 2022-03-16 DIAGNOSIS — E1165 Type 2 diabetes mellitus with hyperglycemia: Secondary | ICD-10-CM | POA: Diagnosis not present

## 2022-03-16 DIAGNOSIS — F1994 Other psychoactive substance use, unspecified with psychoactive substance-induced mood disorder: Secondary | ICD-10-CM | POA: Diagnosis not present

## 2022-03-16 DIAGNOSIS — Z046 Encounter for general psychiatric examination, requested by authority: Secondary | ICD-10-CM | POA: Diagnosis not present

## 2022-03-16 LAB — GLUCOSE, CAPILLARY
Glucose-Capillary: 221 mg/dL — ABNORMAL HIGH (ref 70–99)
Glucose-Capillary: 281 mg/dL — ABNORMAL HIGH (ref 70–99)

## 2022-03-16 MED ORDER — INSULIN PEN NEEDLE 32G X 4 MM MISC
1.0000 | Freq: Two times a day (BID) | 3 refills | Status: DC
  Filled 2022-03-16: qty 100, 25d supply, fill #0

## 2022-03-16 MED ORDER — LORAZEPAM 0.5 MG PO TABS
ORAL_TABLET | ORAL | 0 refills | Status: DC
  Filled 2022-03-16: qty 5, 5d supply, fill #0

## 2022-03-16 MED ORDER — LORAZEPAM 1 MG PO TABS: 1.0000 mg | ORAL_TABLET | Freq: Every day | ORAL | 0 refills | Status: DC

## 2022-03-16 MED ORDER — INSULIN ASPART 100 UNIT/ML FLEXPEN
3.0000 [IU] | PEN_INJECTOR | Freq: Three times a day (TID) | SUBCUTANEOUS | 11 refills | Status: DC
  Filled 2022-03-16: qty 9, 90d supply, fill #0

## 2022-03-16 MED ORDER — INSULIN GLARGINE-YFGN 100 UNIT/ML ~~LOC~~ SOLN
30.0000 [IU] | Freq: Two times a day (BID) | SUBCUTANEOUS | Status: DC
  Administered 2022-03-16: 30 [IU] via SUBCUTANEOUS
  Filled 2022-03-16 (×2): qty 0.3

## 2022-03-16 MED ORDER — INSULIN DETEMIR 100 UNIT/ML FLEXPEN
40.0000 [IU] | PEN_INJECTOR | Freq: Every day | SUBCUTANEOUS | 11 refills | Status: DC
  Filled 2022-03-16: qty 15, 37d supply, fill #0

## 2022-03-16 MED ORDER — GLUCOSE BLOOD VI STRP
ORAL_STRIP | 0 refills | Status: DC
  Filled 2022-03-16: qty 100, 25d supply, fill #0

## 2022-03-16 MED ORDER — ACCU-CHEK SOFTCLIX LANCETS MISC
0 refills | Status: DC
  Filled 2022-03-16: qty 100, 25d supply, fill #0

## 2022-03-16 MED ORDER — LORAZEPAM 1 MG PO TABS: 1.0000 mg | ORAL_TABLET | Freq: Every day | ORAL | 0 refills | Status: AC

## 2022-03-16 MED ORDER — BLOOD GLUCOSE MONITOR KIT
PACK | 0 refills | Status: DC
  Filled 2022-03-16: qty 1, 25d supply, fill #0

## 2022-03-16 MED ORDER — OLANZAPINE 10 MG PO TABS
10.0000 mg | ORAL_TABLET | Freq: Two times a day (BID) | ORAL | 3 refills | Status: DC
  Filled 2022-03-16: qty 60, 30d supply, fill #0

## 2022-03-16 MED ORDER — LISINOPRIL 20 MG PO TABS
20.0000 mg | ORAL_TABLET | Freq: Every day | ORAL | 0 refills | Status: DC
  Filled 2022-03-16: qty 30, 30d supply, fill #0

## 2022-03-16 MED ORDER — METFORMIN HCL ER 500 MG PO TB24
2000.0000 mg | ORAL_TABLET | Freq: Every morning | ORAL | 0 refills | Status: DC
  Filled 2022-03-16: qty 120, 30d supply, fill #0

## 2022-03-16 NOTE — Discharge Summary (Signed)
Physician Discharge Summary  Makennah Omura JOI:[id number] DOB: 1965/07/16 DOA: 03/06/2022  PCP: Patient, No Pcp Per  Admit date: 03/06/2022 Discharge date: 03/16/2022  Admitted From: Home Disposition:  Home  Recommendations for Outpatient Follow-up:  Follow up with PCP in 1-2 weeks Please obtain BMP/CBC in one week   Home Health:No Equipment/Devices:None  Discharge Condition:Stable CODE STATUS:Full Diet recommendation: Heart Healthy  Brief/Interim Summary:  56 y.o. female past medical history of schizoaffective disorder, diabetes mellitus type 2 with multiple visits to the ED for dry gangrene requiring intervention and hyperglycemia in the 400s, came into the hospital psychotic in handcuffs covered in feces found to be afebrile hemoglobin of 10.9 UDS positive for glucose, blood glucose in the 400 x-ray showed subcutaneous air loculation of gas but no osteo.  MRI did show osteo.  Patient is medically stable for transfer.   Discharge Diagnoses:  Principal Problem:   Dry gangrene toes of bilateral feet (HCC) Active Problems:   Psychosis (HCC)   Type 2 diabetes mellitus (HCC)   Cocaine dependence (HCC)   Substance induced mood disorder (HCC)   Gangrene (HCC)   Involuntary commitment  Schizoaffective disorder with acute psychosis/involuntary committed, She has been restricted psychiatry was consulted who involuntarily committed her. Approach was probably due to substance abuse. She is already taking Ativan Benadryl lorazepam and olanzapine after several days her mentation and will significantly. Psychiatry deemed her not a candidate for inpatient psychiatry after several days of evaluation in house. She will go home on oral medication.  Dry gangrene of bilateral toes: Psychiatry was consulted evaluated the x-ray related that she had no gas loculation and was multiple layers of hyperkeratotic of the skin. She completed course of antibiotics in house.  Diabetes mellitus type 2  uncontrolled: She was placed on long-acting insulin metformin and her blood glucose remain fairly controlled. She will need to follow-up with a primary care doctor as an outpatient titrate insulin as an outpatient. She was able to prove that she check her blood glucose in house and gave herself her insulin.  Uncontrolled essential hypertension: She relates she ran out of her medication and she could not take them and she was given prescription.  Discharge Instructions  Discharge Instructions     Diet - low sodium heart healthy   Complete by: As directed    Increase activity slowly   Complete by: As directed       Allergies as of 03/16/2022       Reactions   Sulfa Antibiotics Shortness Of Breath, Rash, Swelling   Latex Rash        Medication List     STOP taking these medications    ondansetron 8 MG tablet Commonly known as: Zofran   pantoprazole 40 MG tablet Commonly known as: PROTONIX       TAKE these medications    blood glucose meter kit and supplies Kit Dispense based on patient and insurance preference. Use up to four times daily as directed.   insulin aspart 100 UNIT/ML injection Commonly known as: novoLOG Inject 3 Units into the skin 3 (three) times daily with meals.   insulin detemir 100 UNIT/ML FlexPen Commonly known as: LEVEMIR Inject 40 Units into the skin daily.   Insulin Pen Needle 31G X 6 MM Misc 1 Device by Does not apply route 2 (two) times daily.   lisinopril 20 MG tablet Commonly known as: ZESTRIL Take 1 tablet (20 mg total) by mouth daily.   LORazepam 1 MG tablet Commonly known as:  ATIVAN Take 1 tablet (1 mg total) by mouth daily for 5 days. Start taking on: March 17, 2022   metFORMIN 500 MG 24 hr tablet Commonly known as: GLUCOPHAGE-XR Take 4 tablets (2,000 mg total) by mouth every morning.   OLANZapine zydis 10 MG disintegrating tablet Commonly known as: ZYPREXA Take 1 tablet (10 mg total) by mouth 2 (two) times daily.         Allergies  Allergen Reactions   Sulfa Antibiotics Shortness Of Breath, Rash and Swelling   Latex Rash    Consultations: Podiatrist Psychiatry   Procedures/Studies: DG Foot 2 Views Right  Result Date: 03/07/2022 CLINICAL DATA:  Rule out osteomyelitis, toe wound EXAM: RIGHT FOOT - 2 VIEW; LEFT FOOT - COMPLETE 3+ VIEW COMPARISON:  01/29/2022 FINDINGS: No acute fracture or dislocation of the feet. Bilateral hallux valgus deformities, left-greater-than-right, without significant associated arthrosis. Probable chronic fracture deformity of the right first proximal phalanx. Soft tissue wound of the tip of the right second digit with small subcutaneous air loculations seen on frontal view. Diffuse soft tissue edema about the forefeet. Unchanged linear metallic foreign body in the dorsal soft tissues overlying the mid right fifth metatarsal. IMPRESSION: 1.  No fracture or dislocation of the feet. 2. Soft tissue wound of the tip of the right second digit with small subcutaneous air loculations seen on frontal view, concerning for gas-forming infection. 3. No bony erosion or sclerosis suspicious for osteomyelitis. MRI is the test of choice the detection of bone marrow edema and osteomyelitis if clinically suspected. 4. Unchanged linear metallic foreign body in the dorsal soft tissues overlying the mid right fifth metatarsal. Electronically Signed   By: Delanna Ahmadi M.D.   On: 03/07/2022 13:25   DG Foot Complete Left  Result Date: 03/07/2022 CLINICAL DATA:  Rule out osteomyelitis, toe wound EXAM: RIGHT FOOT - 2 VIEW; LEFT FOOT - COMPLETE 3+ VIEW COMPARISON:  01/29/2022 FINDINGS: No acute fracture or dislocation of the feet. Bilateral hallux valgus deformities, left-greater-than-right, without significant associated arthrosis. Probable chronic fracture deformity of the right first proximal phalanx. Soft tissue wound of the tip of the right second digit with small subcutaneous air loculations seen  on frontal view. Diffuse soft tissue edema about the forefeet. Unchanged linear metallic foreign body in the dorsal soft tissues overlying the mid right fifth metatarsal. IMPRESSION: 1.  No fracture or dislocation of the feet. 2. Soft tissue wound of the tip of the right second digit with small subcutaneous air loculations seen on frontal view, concerning for gas-forming infection. 3. No bony erosion or sclerosis suspicious for osteomyelitis. MRI is the test of choice the detection of bone marrow edema and osteomyelitis if clinically suspected. 4. Unchanged linear metallic foreign body in the dorsal soft tissues overlying the mid right fifth metatarsal. Electronically Signed   By: Delanna Ahmadi M.D.   On: 03/07/2022 13:25   DG Chest Portable 1 View  Result Date: 02/24/2022 CLINICAL DATA:  chest pain, cocaine EXAM: PORTABLE CHEST 1 VIEW COMPARISON:  04/19/2019 FINDINGS: Heart and mediastinal contours are within normal limits. No focal opacities or effusions. No acute bony abnormality. IMPRESSION: No active disease. Electronically Signed   By: Rolm Baptise M.D.   On: 02/24/2022 20:20   (Echo, Carotid, EGD, Colonoscopy, ERCP)    Subjective: No complaints  Discharge Exam: Vitals:   03/15/22 2101 03/16/22 0719  BP: 105/72 116/77  Pulse: 92 85  Resp: 20 18  Temp: 99.4 F (37.4 C) 98.6 F (37 C)  SpO2:  97% 100%   Vitals:   03/14/22 2317 03/15/22 1347 03/15/22 2101 03/16/22 0719  BP: 117/74 129/73 105/72 116/77  Pulse: 87 86 92 85  Resp: _0 Temp: 97.9 F (36.6 C) 98 F (36.7 C) 99.4 F (37.4 C) 98.6 F (37 C)  TempSrc: Oral Oral Oral Oral  SpO2: 98% 97% 97% 100%  Height:        General: Pt is alert, awake, not in acute distress Cardiovascular: RRR, S1/S2 +, no rubs, no gallops Respiratory: CTA bilaterally, no wheezing, no rhonchi Abdominal: Soft, NT, ND, bowel sounds + Extremities: no edema, no cyanosis    The results of significant diagnostics from this  hospitalization (including imaging, microbiology, ancillary and laboratory) are listed below for reference.     Microbiology: Recent Results (from the past 240 hour(s))  Resp Panel by RT-PCR (Flu A&B, Covid) Anterior Nasal Swab     Status: None   Collection Time: 03/07/22  2:13 AM   Specimen: Anterior Nasal Swab  Result Value Ref Range Status   SARS Coronavirus 2 by RT PCR NEGATIVE NEGATIVE Final    Comment: (NOTE) SARS-CoV-2 target nucleic acids are NOT DETECTED.  The SARS-CoV-2 RNA is generally detectable in upper respiratory specimens during the acute phase of infection. The lowest concentration of SARS-CoV-2 viral copies this assay can detect is 138 copies/mL. A negative result does not preclude SARS-Cov-2 infection and should not be used as the sole basis for treatment or other patient management decisions. A negative result may occur with  improper specimen collection/handling, submission of specimen other than nasopharyngeal swab, presence of viral mutation(s) within the areas targeted by this assay, and inadequate number of viral copies(<138 copies/mL). A negative result must be combined with clinical observations, patient history, and epidemiological information. The expected result is Negative.  Fact Sheet for Patients:  EntrepreneurPulse.com.au  Fact Sheet for Healthcare Providers:  IncredibleEmployment.be  This test is no t yet approved or cleared by the [place] FDA and  has been authorized for detection and/or diagnosis of SARS-CoV-2 by FDA under an Emergency Use Authorization (EUA). This EUA will remain  in effect (meaning this test can be used) for the duration of the COVID-19 declaration under Section 564(b)(1) of the Act, 21 U.S.C.section 360bbb-3(b)(1), unless the authorization is terminated  or revoked sooner.       Influenza A by PCR NEGATIVE NEGATIVE Final   Influenza B by PCR NEGATIVE NEGATIVE Final    Comment:  (NOTE) The Xpert Xpress SARS-CoV-2/FLU/RSV plus assay is intended as an aid in the diagnosis of influenza from Nasopharyngeal swab specimens and should not be used as a sole basis for treatment. Nasal washings and aspirates are unacceptable for Xpert Xpress SARS-CoV-2/FLU/RSV testing.  Fact Sheet for Patients: EntrepreneurPulse.com.au  Fact Sheet for Healthcare Providers: IncredibleEmployment.be  This test is not yet approved or cleared by the [place] FDA and has been authorized for detection and/or diagnosis of SARS-CoV-2 by FDA under an Emergency Use Authorization (EUA). This EUA will remain in effect (meaning this test can be used) for the duration of the COVID-19 declaration under Section 564(b)(1) of the Act, 21 U.S.C. section 360bbb-3(b)(1), unless the authorization is terminated or revoked.  Performed at [facility], [place] [address], [place], [place] [address]      Labs: BNP (last 3 results) Recent Labs    01/27/22 0304  BNP 94.7   Basic Metabolic Panel: Recent Labs  Lab 03/15/22 1236  NA 136  K  4.4  CL 100  CO2 27  GLUCOSE 279*  BUN 21*  CREATININE 0.83  CALCIUM 9.6   Liver Function Tests: No results for input(s): "AST", "ALT", "ALKPHOS", "BILITOT", "PROT", "ALBUMIN" in the last 168 hours. No results for input(s): "LIPASE", "AMYLASE" in the last 168 hours. No results for input(s): "AMMONIA" in the last 168 hours. CBC: No results for input(s): "WBC", "NEUTROABS", "HGB", "HCT", "MCV", "PLT" in the last 168 hours. Cardiac Enzymes: No results for input(s): "CKTOTAL", "CKMB", "CKMBINDEX", "TROPONINI" in the last 168 hours. BNP: Invalid input(s): "POCBNP" CBG: Recent Labs  Lab 03/15/22 0900 03/15/22 1144 03/15/22 1713 03/15/22 2102 03/16/22 0719  GLUCAP 407* 319* 110* 253* 221*   D-Dimer No results for input(s): "DDIMER" in the last 72 hours. Hgb A1c No results for input(s): "HGBA1C" in the last 72  hours. Lipid Profile No results for input(s): "CHOL", "HDL", "LDLCALC", "TRIG", "CHOLHDL", "LDLDIRECT" in the last 72 hours. Thyroid function studies No results for input(s): "TSH", "T4TOTAL", "T3FREE", "THYROIDAB" in the last 72 hours.  Invalid input(s): "FREET3" Anemia work up No results for input(s): "VITAMINB12", "FOLATE", "FERRITIN", "TIBC", "IRON", "RETICCTPCT" in the last 72 hours. Urinalysis    Component Value Date/Time   COLORURINE STRAW (A) 02/14/2022 0445   APPEARANCEUR CLEAR 02/14/2022 0445   LABSPEC 1.018 02/14/2022 0445   PHURINE 6.0 02/14/2022 0445   GLUCOSEU >=500 (A) 02/14/2022 0445   HGBUR NEGATIVE 02/14/2022 0445   BILIRUBINUR NEGATIVE 02/14/2022 0445   KETONESUR 5 (A) 02/14/2022 0445   PROTEINUR NEGATIVE 02/14/2022 0445   NITRITE NEGATIVE 02/14/2022 0445   LEUKOCYTESUR NEGATIVE 02/14/2022 0445   Sepsis Labs No results for input(s): "WBC" in the last 168 hours.  Invalid input(s): "PROCALCITONIN", "LACTICIDVEN" Microbiology Recent Results (from the past 240 hour(s))  Resp Panel by RT-PCR (Flu A&B, Covid) Anterior Nasal Swab     Status: None   Collection Time: 03/07/22  2:13 AM   Specimen: Anterior Nasal Swab  Result Value Ref Range Status   SARS Coronavirus 2 by RT PCR NEGATIVE NEGATIVE Final    Comment: (NOTE) SARS-CoV-2 target nucleic acids are NOT DETECTED.  The SARS-CoV-2 RNA is generally detectable in upper respiratory specimens during the acute phase of infection. The lowest concentration of SARS-CoV-2 viral copies this assay can detect is 138 copies/mL. A negative result does not preclude SARS-Cov-2 infection and should not be used as the sole basis for treatment or other patient management decisions. A negative result may occur with  improper specimen collection/handling, submission of specimen other than nasopharyngeal swab, presence of viral mutation(s) within the areas targeted by this assay, and inadequate number of viral copies(<138  copies/mL). A negative result must be combined with clinical observations, patient history, and epidemiological information. The expected result is Negative.  Fact Sheet for Patients:  EntrepreneurPulse.com.au  Fact Sheet for Healthcare Providers:  IncredibleEmployment.be  This test is no t yet approved or cleared by the [place] FDA and  has been authorized for detection and/or diagnosis of SARS-CoV-2 by FDA under an Emergency Use Authorization (EUA). This EUA will remain  in effect (meaning this test can be used) for the duration of the COVID-19 declaration under Section 564(b)(1) of the Act, 21 U.S.C.section 360bbb-3(b)(1), unless the authorization is terminated  or revoked sooner.       Influenza A by PCR NEGATIVE NEGATIVE Final   Influenza B by PCR NEGATIVE NEGATIVE Final    Comment: (NOTE) The Xpert Xpress SARS-CoV-2/FLU/RSV plus assay is intended as an aid in the diagnosis of  influenza from Nasopharyngeal swab specimens and should not be used as a sole basis for treatment. Nasal washings and aspirates are unacceptable for Xpert Xpress SARS-CoV-2/FLU/RSV testing.  Fact Sheet for Patients: EntrepreneurPulse.com.au  Fact Sheet for Healthcare Providers: IncredibleEmployment.be  This test is not yet approved or cleared by the [place] FDA and has been authorized for detection and/or diagnosis of SARS-CoV-2 by FDA under an Emergency Use Authorization (EUA). This EUA will remain in effect (meaning this test can be used) for the duration of the COVID-19 declaration under Section 564(b)(1) of the Act, 21 U.S.C. section 360bbb-3(b)(1), unless the authorization is terminated or revoked.  Performed at [facility], [place] [address], [place], [place] [address]      Time coordinating discharge: Over 30 minutes  SIGNED:   Charlynne Cousins, MD  [facility] 03/16/2022, 10:09  AM Pager   If 7PM-7AM, please contact night-coverage [email or web address] Password [email or web address]

## 2022-03-16 NOTE — Consult Note (Signed)
[facility] Followup Face-to-Face Psychiatric Evaluation   Service Date: March 16, 2022 LOS:  LOS: 7 days    Assessment    Katherine Franklin is a 56 y.o. female admitted medically for 03/06/2022 10:48 PM for psychotic behavior, brought in by EMS and found to have concern for gas forming infection in bilateral lower extremities. She carries the psychiatric diagnoses of schizoaffective disorder, cocaine abuse, PTSD and has a past medical history of  hypertension and uncontrolled diabetes.Psychiatry was consulted for "PSYCHOSIS, AGGRESSION, AND AGITATION BEING MEDICALLY ADMITTED" by [facility].    The patient had been placed on a forced medication protocol (details below) but has complied with oral medications (Ativan and Zyprexa) for the past 3 days. She has been intermittently refusing CBG checks and SSI. She has demonstrated improved behavior and exhibits a logical thought process, and a logical discharge plan that she has been consistent regarding. Patient appeared less persecutory of staff today, but does still have a delusion regarding a church denomination that also uses crystals, that she wants to start. Regardless this delusion brings comfort to the patient and all of her other writings and drawings were positive and more logically oriented.  Diagnoses:  Active Hospital problems: Principal Problem:   Dry gangrene toes of bilateral feet (HCC) Active Problems:   Cocaine dependence (HCC)   Substance induced mood disorder (HCC)   Psychosis ([place])   Type 2 diabetes mellitus ([place])   Gangrene ([place])   Involuntary commitment     Plan  ## Safety and Observation Level:  - Based on my clinical evaluation, I estimate the patient to be at low risk of self harm in the current setting and recommend routine observation.     ##Schizoaffective Disorder ##Substance induced Psychotic Disorder ##Cocaine Use Disorder Forced medication protocol (initiated 03/09/22) --Continue  Zyprexa 10 mg BID PO or IM --Discontinued daily ECG as her Qtc has been relatively stable and has not required multiple PRNs  --Discontinue Agitation Protocol: Haldol 5 mg + Benadryl 50 mg + Ativan 2 mg q6h    Patient is also on PO Ativan (not a forced med). -Continue Ativan 1 mg daily until patient seen by ouptatient psychiatric provider   ## Medical Decision Making Capacity:  Not formally assessed today   ## Further Work-up:  -MEDICALLY CLEARED   -- most recent EKG on 03/11/22 had QtC of 447 -- Pertinent labwork reviewed earlier this admission includes:              -HGB 10.9             -BG 338             -UDS pending from this hospitalization              - TSH 0.19             -A1c 11.3   ## Disposition:  -likely discharge tomorrow   ## Behavioral / Environmental:    ##Legal Status --Under IVC   Thank you for this consult request. Recommendations have been communicated to the primary team.  We sign off at this time.   PGY-3 Freida Busman, MD   followup history  Relevant Aspects of Hospital Course:  Admitted on 03/06/2022 for psychotic behavior brought in by EMS, found to have bilateral lower extremities infection in the setting of uncontrolled diabetes.  Patient Report:  Patient is up and out of bed this AM endorsing she will be discharged this afternoon. Patient reports that she  is a bit anxious about discharge, because she will not be able to get her government check due to the day being Friday. Patient reports that this has made her a bit irritated and reminds her of previous poor treatment in the healthcare system. Patient redirected herself endorsing that she did not want to continue to be upset and began talking about positive things. Patient endorsed she has a plan to help her get through the weekend and that she intends to follow up for her mental health at the [facility] next week. Patient reports that she sleeps well on her medicine and denies  SI, HI, and AVH. Patient reports that she has a good appetite as well.    Patient expressed that she will continue working on transitioning her life from [place] to [place] over the next week and allocating her expenses appropriately.   Psychiatric History:  Information collected from chart: Multiple ED visits, hx of schizoaffective disorder and cocaine use disorder     Social History:  Tobacco use: unknown Alcohol use: unknown Drug use: UDS positive for cocaine last ED visit   Family History:  The patient's Family history is unknown by patient.   Medical History: Past Medical History:  Diagnosis Date   Diabetes mellitus without complication ([place])    Drug abuse ([place]) 09/22/2018   GERD (gastroesophageal reflux disease)    Hypertension    PTSD (post-traumatic stress disorder)    Schizo affective schizophrenia ([place])     Surgical History: Past Surgical History:  Procedure Laterality Date   SKIN GRAFT     TUBAL LIGATION      Medications:   Current Facility-Administered Medications:    acetaminophen (TYLENOL) tablet 650 mg, 650 mg, Oral, Q6H PRN **OR** acetaminophen (TYLENOL) suppository 650 mg, 650 mg, Rectal, Q6H PRN, Orma Flaming, MD   haloperidol lactate (HALDOL) injection 5 mg, 5 mg, Intramuscular, Q6H PRN, 5 mg at 03/11/22 1544 **AND** diphenhydrAMINE (BENADRYL) injection 50 mg, 50 mg, Intramuscular, Q6H PRN, 50 mg at 03/11/22 1544 **AND** LORazepam (ATIVAN) injection 2 mg, 2 mg, Intramuscular, Q6H PRN, [name], MD, 2 mg at 03/11/22 1544   insulin aspart (novoLOG) injection 0-15 Units, 0-15 Units, Subcutaneous, TID WC, Charlynne Cousins, MD, 8 Units at 03/16/22 1203   insulin aspart (novoLOG) injection 0-5 Units, 0-5 Units, Subcutaneous, QHS, Charlynne Cousins, MD, 3 Units at 03/15/22 2147   insulin aspart (novoLOG) injection 3 Units, 3 Units, Subcutaneous, TID WC, Charlynne Cousins, MD, 3 Units at 03/16/22 1203   insulin glargine-yfgn (SEMGLEE) injection  30 Units, 30 Units, Subcutaneous, BID, Charlynne Cousins, MD, 30 Units at 03/16/22 1003   living well with diabetes book MISC, , Does not apply, Once, Nita Sells, MD   LORazepam (ATIVAN) tablet 1 mg, 1 mg, Oral, Daily, 1 mg at 03/16/22 1002 **OR** [DISCONTINUED] LORazepam (ATIVAN) injection 2 mg, 2 mg, Intramuscular, BID, [name], MD, 2 mg at 03/11/22 2247   OLANZapine zydis (ZYPREXA) disintegrating tablet 10 mg, 10 mg, Oral, BID, 10 mg at 03/16/22 0806 **OR** OLANZapine (ZYPREXA) injection 10 mg, 10 mg, Intramuscular, BID, [name], MD, 10 mg at 03/11/22 2248  Allergies: Allergies  Allergen Reactions   Sulfa Antibiotics Shortness Of Breath, Rash and Swelling   Latex Rash       Objective  Vital signs:  Temp:  [98 F (36.7 C)-99.4 F (37.4 C)] 98.6 F (37 C) (11/03 0719) Pulse Rate:  [85-92] 85 (11/03 0719) Resp:  [17-20] 18 (11/03 0719)  BP: (105-129)/(72-77) 116/77 (11/03 0719) SpO2:  [97 %-100 %] 100 % (11/03 0719)  Psychiatric Specialty Exam:  Presentation  General Appearance:  Appropriate for Environment  Eye Contact: Good  Speech: Clear and Coherent  Speech Volume: Normal  Handedness: Right   Mood and Affect  Mood: Irritable  Affect: Appropriate   Thought Process  Thought Processes: Coherent  Descriptions of Associations:Intact  Orientation:Full (Time, Place and Person)  Thought Content:Logical  History of Schizophrenia/Schizoaffective disorder:No data recorded Duration of Psychotic Symptoms:No data recorded Hallucinations:Hallucinations: None  Ideas of Reference:Delusions (delusion about some church denomination she named and drew, but otherwise very logical and overall her religion provides her comfort)  Suicidal Thoughts:Suicidal Thoughts: No  Homicidal Thoughts:Homicidal Thoughts: No   Sensorium  Memory: Immediate Fair; Recent Fair  Judgment: Fair  Insight: Fair   Community education officer   Concentration: Good  Attention Span: Poor  Recall: Good  Fund of Knowledge: Fair  Language: Good   Psychomotor Activity  Psychomotor Activity: Psychomotor Activity: Normal   Assets  Assets: Communication Skills; Desire for Improvement; Resilience   Sleep  Sleep: Sleep: Good    Physical Exam: Physical Exam HENT:     Head: Normocephalic and atraumatic.  Pulmonary:     Effort: Pulmonary effort is normal.  Neurological:     Mental Status: She is alert and oriented to person, place, and time.    Review of Systems  Psychiatric/Behavioral:  Negative for hallucinations and suicidal ideas.    Blood pressure 116/77, pulse 85, temperature 98.6 F (37 C), temperature source Oral, resp. rate 18, height 5\' 9"  (1.753 m), SpO2 100 %. Body mass index is 35.44 kg/m.

## 2022-03-16 NOTE — TOC Transition Note (Signed)
Transition of Care [facility]) - CM/SW Discharge Note   Patient Details  Name: Katherine Franklin MRN: [id number] Date of Birth: [birth year]  Transition of [facility]) CM/SW Contact:  Joanne Chars, LCSW Phone Number: 03/16/2022, 12:54 PM   Clinical Narrative:   IVC rescinded by Sheran Fava.  Form faxed to [facility] clerk and placed on chart.  Spoke with pt, provided bus pass as she is requesting transport to [address].    Final next level of care: Home/Self Care Barriers to Discharge: Barriers Resolved   Patient Goals and CMS Choice        Discharge Placement                       Discharge Plan and Services                                     Social Determinants of Health (SDOH) Interventions     Readmission Risk Interventions     No data to display

## 2022-03-17 ENCOUNTER — Encounter (HOSPITAL_COMMUNITY): Payer: Self-pay | Admitting: Emergency Medicine

## 2022-03-17 ENCOUNTER — Other Ambulatory Visit: Payer: Self-pay

## 2022-03-17 ENCOUNTER — Emergency Department (HOSPITAL_COMMUNITY)
Admission: EM | Admit: 2022-03-17 | Discharge: 2022-03-18 | Discharge status: Against Medical Advice | Payer: Medicaid Other | Attending: Emergency Medicine | Admitting: Emergency Medicine

## 2022-03-17 DIAGNOSIS — M79674 Pain in right toe(s): Secondary | ICD-10-CM | POA: Diagnosis not present

## 2022-03-17 DIAGNOSIS — Z5321 Procedure and treatment not carried out due to patient leaving prior to being seen by health care provider: Secondary | ICD-10-CM | POA: Diagnosis not present

## 2022-03-17 DIAGNOSIS — E1165 Type 2 diabetes mellitus with hyperglycemia: Secondary | ICD-10-CM | POA: Diagnosis not present

## 2022-03-17 DIAGNOSIS — M79675 Pain in left toe(s): Secondary | ICD-10-CM | POA: Insufficient documentation

## 2022-03-17 DIAGNOSIS — Z5900 Homelessness unspecified: Secondary | ICD-10-CM | POA: Diagnosis not present

## 2022-03-17 LAB — CBG MONITORING, ED: Glucose-Capillary: 254 mg/dL — ABNORMAL HIGH (ref 70–99)

## 2022-03-17 NOTE — ED Triage Notes (Signed)
Pt reported to ED for evaluation of pain to toes on bilateral feet stating she is concerned for possible infection d/t T2DM. Pt also states that she is homeless and does not have a refrigerator to store insulin in.

## 2022-03-17 NOTE — ED Provider Triage Note (Signed)
Emergency Medicine Provider Triage Evaluation Note  Katherine Franklin , a 56 y.o. female  was evaluated in triage.  Pt complains of dark toes.  Patient has a known history of dry gangrene.  She was discharged from the hospital yesterday.  Has recently been on antibiotics.  Denies any acute changes that she is aware of.  States that she wanted to have her toes checked.  States that she has not taken her diabetes medications today.  Review of Systems  Positive: Toe wound Negative: Fever  Physical Exam  BP 120/81 (BP Location: Left Arm)   Pulse 99   Temp 98.3 F (36.8 C)   Resp 19   SpO2 97%  Gen:   Awake, no distress   Resp:  Normal effort  MSK:   Moves extremities without difficulty  Other:  Patient with second toe ulceration, no drainage  Medical Decision Making  Medically screening exam initiated at 10:44 PM.  Appropriate orders placed.  Katherine Franklin was informed that the remainder of the evaluation will be completed by another provider, this initial triage assessment does not replace that evaluation, and the importance of remaining in the ED until their evaluation is complete.     Carlisle Cater, PA-C 03/17/22 2245

## 2022-03-18 NOTE — ED Notes (Signed)
Pt told me she was leaving, pt left 

## 2022-03-19 ENCOUNTER — Other Ambulatory Visit: Payer: Self-pay

## 2022-03-19 ENCOUNTER — Encounter (HOSPITAL_COMMUNITY): Payer: Self-pay | Admitting: Emergency Medicine

## 2022-03-19 ENCOUNTER — Emergency Department (HOSPITAL_COMMUNITY)
Admission: EM | Admit: 2022-03-19 | Discharge: 2022-03-20 | Disposition: A | Discharge status: Home / Self Care | Payer: Medicaid Other | Attending: Emergency Medicine | Admitting: Emergency Medicine

## 2022-03-19 DIAGNOSIS — R739 Hyperglycemia, unspecified: Secondary | ICD-10-CM

## 2022-03-19 DIAGNOSIS — F1490 Cocaine use, unspecified, uncomplicated: Secondary | ICD-10-CM | POA: Diagnosis not present

## 2022-03-19 DIAGNOSIS — F17210 Nicotine dependence, cigarettes, uncomplicated: Secondary | ICD-10-CM | POA: Diagnosis not present

## 2022-03-19 DIAGNOSIS — E1165 Type 2 diabetes mellitus with hyperglycemia: Secondary | ICD-10-CM | POA: Insufficient documentation

## 2022-03-19 DIAGNOSIS — E1140 Type 2 diabetes mellitus with diabetic neuropathy, unspecified: Secondary | ICD-10-CM | POA: Insufficient documentation

## 2022-03-19 DIAGNOSIS — R531 Weakness: Secondary | ICD-10-CM | POA: Diagnosis present

## 2022-03-19 DIAGNOSIS — I10 Essential (primary) hypertension: Secondary | ICD-10-CM | POA: Diagnosis not present

## 2022-03-19 NOTE — ED Provider Triage Note (Signed)
Emergency Medicine Provider Triage Evaluation Note  Katherine Franklin , Franklin 56 y.o. female  was evaluated in triage.  Pt complains of concerns for generalized weakness as well as hyperglycemia.  Patient also endorses chest pain.  Notes that she used crack cocaine yesterday as well as today.  Denies shortness of breath, nausea, vomiting.  Notes that she has not been taking her diabetic medications as prescribed to her.  Patient is currently homeless.  Review of Systems  Positive:  Negative:   Physical Exam  BP (!) 144/89 (BP Location: Right Arm)   Pulse 100   Temp 99.5 F (37.5 C) (Oral)   Resp 17   Wt 108.9 kg   SpO2 98%   BMI 35.44 kg/m  Gen:   Awake, no distress   Resp:  Normal effort  MSK:   Moves extremities without difficulty  Other:    Medical Decision Making  Medically screening exam initiated at 11:03 PM.  Appropriate orders placed.  Katherine Franklin was informed that the remainder of the evaluation will be completed by another provider, this initial triage assessment does not replace that evaluation, and the importance of remaining in the ED until their evaluation is complete.  Work-up initiated   Katherine Budzik A, PA-C 03/19/22 2304

## 2022-03-19 NOTE — ED Provider Notes (Signed)
[facility] Emergency Department Provider Note MRN:  [id number]  Arrival date & time: 03/20/22     Chief Complaint   Weakness and Hyperglycemia   History of Present Illness   Katherine Franklin is a 56 y.o. year-old female with a history of diabetes, hypertension, drug abuse presenting to the ED with chief complaint of weakness.  Patient complaining of weakness, neuropathy in her legs, concerned about her drug use.  She has been binging on crack cocaine recently.  Wants to quit, wants to not be homeless anymore.  Review of Systems  A thorough review of systems was obtained and all systems are negative except as noted in the HPI and PMH.   Patient's Health History    Past Medical History:  Diagnosis Date   Diabetes mellitus without complication ([place])    Drug abuse ([place]) 09/22/2018   GERD (gastroesophageal reflux disease)    Hypertension    PTSD (post-traumatic stress disorder)    Schizo affective schizophrenia ([place])     Past Surgical History:  Procedure Laterality Date   SKIN GRAFT     TUBAL LIGATION      Family History  Family history unknown: Yes    Social History   Socioeconomic History   Marital status: Single    Spouse name: Not on file   Number of children: Not on file   Years of education: Not on file   Highest education level: Not on file  Occupational History   Occupation: Prostitute  Tobacco Use   Smoking status: Every Day    Packs/day: 2.00    Types: Cigarettes   Smokeless tobacco: Never  Vaping Use   Vaping Use: Never used  Substance and Sexual Activity   Alcohol use: Not Currently   Drug use: Yes    Frequency: 7.0 times per week    Types: "Crack" cocaine, Cocaine    Comment: undetermined amount   Sexual activity: Not Currently  Other Topics Concern   Not on file  Social History Narrative   ** Merged History Encounter **       Pt indicated that she lives in [place], 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    Social Determinants of Health   Financial Resource Strain: Not on file  Food Insecurity: Not on file  Transportation Needs: Not on file  Physical Activity: Not on file  Stress: Not on file  Social Connections: Not on file  Intimate Partner Violence: Not on file     Physical Exam   Vitals:   03/19/22 2258 03/20/22 0515  BP: (!) 144/89 (!) 148/92  Pulse: 100 (!) 105  Resp: 17 18  Temp: 99.5 F (37.5 C) 98.5 F (36.9 C)  SpO2: 98% 98%    CONSTITUTIONAL: Chronically ill-appearing, NAD NEURO/PSYCH:  Alert and oriented x 3, no focal deficits EYES:  eyes equal and reactive ENT/NECK:  no LAD, no JVD CARDIO: Regular rate, well-perfused, normal S1 and S2 PULM:  CTAB no wheezing or rhonchi GI/GU:  non-distended, non-tender MSK/SPINE:  No gross deformities, no edema SKIN:  no rash, atraumatic   *Additional and/or pertinent findings included in MDM below  Diagnostic and Interventional Summary    EKG Interpretation  Date/Time:  Tuesday March 20 2022 00:23:22 EST Ventricular Rate:  95 PR Interval:  152 QRS Duration: 83 QT Interval:  356 QTC Calculation: 448 R Axis:   55 Text Interpretation: Sinus rhythm Confirmed by Gerlene Fee [phone]) on 03/20/2022 1:11:40 AM  Labs Reviewed  BASIC METABOLIC PANEL - Abnormal; Notable for the following components:      Result Value   Glucose, Bld 304 (*)    BUN 24 (*)    All other components within normal limits  CBC WITH DIFFERENTIAL/PLATELET - Abnormal; Notable for the following components:   Hemoglobin 11.2 (*)    HCT 35.0 (*)    All other components within normal limits  CBG MONITORING, ED - Abnormal; Notable for the following components:   Glucose-Capillary 288 (*)    All other components within normal limits  URINALYSIS, ROUTINE W REFLEX MICROSCOPIC  TROPONIN I (HIGH SENSITIVITY)  TROPONIN I (HIGH SENSITIVITY)    No orders to display    Medications - No data to display   Procedures  /  Critical  Care Procedures  ED Course and Medical Decision Making  Initial Impression and Ddx Generalized weakness, complaining of neuropathy in the legs which is chronic.  She has hyperkeratotic feet and toes, recently admitted to psychiatry, there was question of infection of the toes but per chart review it seems that there was less of a concern for infection and that her toes are simply hyperkeratotic from walking on them often.  I do not see any redness or erythema I am at this time.  Right great toe is a bit swollen but she says this is more chronic.  Awaiting labs.  Past medical/surgical history that increases complexity of ED encounter: Diabetes  Interpretation of Diagnostics I personally reviewed the laboratory assessment and my interpretation is as follows: No significant blood count or electrolyte disturbance, no signs of DKA.    Patient Reassessment and Ultimate Disposition/Management     Patient feeling better, eating and drinking, interested in being discharged.  Patient management required discussion with the following services or consulting groups:  None  Complexity of Problems Addressed Acute illness or injury that poses threat of life of bodily function  Additional Data Reviewed and Analyzed Further history obtained from: Prior ED visit notes and Recent discharge summary  Additional Factors Impacting ED Encounter Risk SDOH impact on management  Katherine Franklin, [place] [email or web address]  Final Clinical Impressions(s) / ED Diagnoses     ICD-10-CM   1. Hyperglycemia  R73.9     2. Crack cocaine use  F14.90       ED Discharge Orders          Ordered    Ambulatory referral to [facility]        03/20/22 0617             Discharge Instructions Discussed with and Provided to Patient:     Discharge Instructions      You were evaluated in the Emergency Department and after careful evaluation, we did  not find any emergent condition requiring admission or further testing in the hospital.  Your exam/testing today was overall reassuring.  Please return to the Emergency Department if you experience any worsening of your condition.  Thank you for allowing [place] to be a part of your care.        Maudie Flakes, MD 03/20/22 [phone]

## 2022-03-19 NOTE — ED Triage Notes (Signed)
Pt homeless, in via [place] with generalized wkns and hyperglycemia (CBG 322 en route). Also with hx of schizoeffective disorder, out of meds and EMS reports some anxiety on the way. Denies any SI/HI  VS en route 172/106 102 16 98%

## 2022-03-20 LAB — CBC WITH DIFFERENTIAL/PLATELET
Abs Immature Granulocytes: 0.02 10*3/uL (ref 0.00–0.07)
Basophils Absolute: 0.1 10*3/uL (ref 0.0–0.1)
Basophils Relative: 1 %
Eosinophils Absolute: 0.2 10*3/uL (ref 0.0–0.5)
Eosinophils Relative: 4 %
HCT: 35 % — ABNORMAL LOW (ref 36.0–46.0)
Hemoglobin: 11.2 g/dL — ABNORMAL LOW (ref 12.0–15.0)
Immature Granulocytes: 0 %
Lymphocytes Relative: 32 %
Lymphs Abs: 2.2 10*3/uL (ref 0.7–4.0)
MCH: 27.5 pg (ref 26.0–34.0)
MCHC: 32 g/dL (ref 30.0–36.0)
MCV: 86 fL (ref 80.0–100.0)
Monocytes Absolute: 0.4 10*3/uL (ref 0.1–1.0)
Monocytes Relative: 6 %
Neutro Abs: 4 10*3/uL (ref 1.7–7.7)
Neutrophils Relative %: 57 %
Platelets: 301 10*3/uL (ref 150–400)
RBC: 4.07 MIL/uL (ref 3.87–5.11)
RDW: 13.4 % (ref 11.5–15.5)
WBC: 6.9 10*3/uL (ref 4.0–10.5)
nRBC: 0 % (ref 0.0–0.2)

## 2022-03-20 LAB — BASIC METABOLIC PANEL
Anion gap: 9 (ref 5–15)
BUN: 24 mg/dL — ABNORMAL HIGH (ref 6–20)
CO2: 23 mmol/L (ref 22–32)
Calcium: 9.2 mg/dL (ref 8.9–10.3)
Chloride: 105 mmol/L (ref 98–111)
Creatinine, Ser: 0.81 mg/dL (ref 0.44–1.00)
GFR, Estimated: >60 mL/min (ref >60)
Glucose, Bld: 304 mg/dL — ABNORMAL HIGH (ref 70–99)
Potassium: 4.1 mmol/L (ref 3.5–5.1)
Sodium: 137 mmol/L (ref 135–145)

## 2022-03-20 LAB — CBG MONITORING, ED: Glucose-Capillary: 288 mg/dL — ABNORMAL HIGH (ref 70–99)

## 2022-03-20 LAB — TROPONIN I (HIGH SENSITIVITY)
Troponin I (High Sensitivity): 2 ng/L (ref <18)
Troponin I (High Sensitivity): 3 ng/L (ref <18)

## 2022-03-20 NOTE — ED Notes (Signed)
Provided with coffee and sandwich per request

## 2022-03-20 NOTE — Discharge Instructions (Addendum)
You were evaluated in the Emergency Department and after careful evaluation, we did not find any emergent condition requiring admission or further testing in the hospital.  Your exam/testing today was overall reassuring.  Please return to the Emergency Department if you experience any worsening of your condition.  Thank you for allowing us to be a part of your care.  

## 2022-03-20 NOTE — ED Notes (Signed)
Patient verbalizes understanding of discharge instructions. Opportunity for questioning and answers were provided. Armband removed by staff, pt discharged from ED. Ambulated out to lobby  

## 2022-03-21 ENCOUNTER — Encounter (HOSPITAL_COMMUNITY): Payer: Self-pay

## 2022-03-21 ENCOUNTER — Other Ambulatory Visit: Payer: Self-pay

## 2022-03-21 ENCOUNTER — Emergency Department (HOSPITAL_COMMUNITY)
Admission: EM | Admit: 2022-03-21 | Discharge: 2022-03-22 | Discharge status: Against Medical Advice | Payer: Medicaid Other | Attending: Emergency Medicine | Admitting: Emergency Medicine

## 2022-03-21 DIAGNOSIS — Z5900 Homelessness unspecified: Secondary | ICD-10-CM | POA: Insufficient documentation

## 2022-03-21 DIAGNOSIS — Z5321 Procedure and treatment not carried out due to patient leaving prior to being seen by health care provider: Secondary | ICD-10-CM | POA: Diagnosis not present

## 2022-03-21 DIAGNOSIS — E119 Type 2 diabetes mellitus without complications: Secondary | ICD-10-CM | POA: Insufficient documentation

## 2022-03-21 NOTE — ED Triage Notes (Signed)
Patient reports she is diabetic and just wants her toes checked.  Reports was at [facility] and had them checked.  States "I admit I'm homeless and id rather be in the lobby then the street".  Patient eating chips upon arrival.

## 2022-03-22 NOTE — ED Notes (Signed)
Patient seen leaving ED 

## 2022-03-31 ENCOUNTER — Emergency Department (HOSPITAL_COMMUNITY)
Admission: EM | Admit: 2022-03-31 | Discharge: 2022-03-31 | Discharge status: Against Medical Advice | Payer: Medicaid Other

## 2022-03-31 NOTE — ED Notes (Signed)
Pt cussing at nurse and tech, raising voice while attempted to get vital signs. Pt stated she was leaving, upon attempt to get her to stay she stated "get away from me bitch".

## 2022-03-31 NOTE — ED Provider Notes (Cosign Needed)
  3:20 AM Patient brought to triage by EMS, actively eating graham crackers that she took off a desk in the acute area.  She begins yelling and screaming, calling staff "bitches" and yelling obscenities.  She if refusing care and walked straight out to the lobby.   Garlon Hatchet, PA-C 03/31/22 [phone]

## 2022-03-31 NOTE — ED Notes (Signed)
Patient presents to ed via ems came to triage cussing calling this RN a bitch and stating all I want is water and food, tried to get patient to go to a triage room states "Fuck you". And walked out to the lobby

## 2022-04-01 ENCOUNTER — Emergency Department (HOSPITAL_COMMUNITY)
Admission: EM | Admit: 2022-04-01 | Discharge: 2022-04-01 | Disposition: A | Discharge status: Home / Self Care | Payer: Medicaid Other | Attending: Emergency Medicine | Admitting: Emergency Medicine

## 2022-04-01 ENCOUNTER — Other Ambulatory Visit: Payer: Self-pay

## 2022-04-01 DIAGNOSIS — Z5900 Homelessness unspecified: Secondary | ICD-10-CM

## 2022-04-01 DIAGNOSIS — Z5902 Unsheltered homelessness: Secondary | ICD-10-CM | POA: Insufficient documentation

## 2022-04-01 DIAGNOSIS — Z91040 Latex allergy status: Secondary | ICD-10-CM | POA: Diagnosis not present

## 2022-04-01 DIAGNOSIS — Z7689 Persons encountering health services in other specified circumstances: Secondary | ICD-10-CM | POA: Insufficient documentation

## 2022-04-01 NOTE — ED Notes (Signed)
Sandwich, coffee and bus pass provided to the pt

## 2022-04-01 NOTE — ED Triage Notes (Signed)
Pt arrived from [address] by [facility] for being cold. Pt states "I'm freezing, my lip hurts and jesus told me to come here to get  help". Pt is a/ox4. Denies CP/SOB.

## 2022-04-01 NOTE — ED Provider Notes (Signed)
[place] EMERGENCY DEPARTMENT Provider Note   CSN: [id number] Arrival date & time: 04/01/22  1928     History  Chief Complaint  Patient presents with   Homeless    Katherine Franklin is a 56 y.o. female presenting today due to being cold.  She says that she is homeless and has been sleeping on the sidewalk for multiple days and has been "the whole time.  No chest pain, shortness of breath or any other complaints.  HPI     Home Medications Prior to Admission medications   Medication Sig Start Date End Date Taking? Authorizing Provider  blood glucose meter kit and supplies KIT Dispense based on patient and insurance preference. Use up to four times daily as directed. 03/16/22   Charlynne Cousins, MD  insulin aspart (NOVOLOG) 100 UNIT/ML FlexPen Inject 3 Units into the skin 3 (three) times daily with meals. Patient not taking: Reported on 03/20/2022 03/16/22   Charlynne Cousins, MD  insulin detemir (LEVEMIR) 100 UNIT/ML FlexPen Inject 40 Units into the skin daily. Patient not taking: Reported on 03/20/2022 03/16/22   Charlynne Cousins, MD  Insulin Pen Needle 32G X 4 MM MISC Use 2 (two) times daily or as directed 03/16/22   Charlynne Cousins, MD  lisinopril (ZESTRIL) 20 MG tablet Take 1 tablet (20 mg total) by mouth daily. 03/16/22   Charlynne Cousins, MD  metFORMIN (GLUCOPHAGE-XR) 500 MG 24 hr tablet Take 4 tablets (2,000 mg total) by mouth every morning. 03/16/22 04/15/22  Charlynne Cousins, MD  OLANZapine (ZYPREXA) 10 MG tablet Take 1 tablet (10 mg total) by mouth 2 (two) times daily. 03/16/22   Charlynne Cousins, MD      Allergies    Sulfa antibiotics and Latex    Review of Systems   Review of Systems  Physical Exam Updated Vital Signs BP (!) 146/104   Pulse 96   SpO2 99%  Physical Exam Vitals and nursing note reviewed.  Constitutional:      Appearance: Normal appearance.  HENT:     Head: Normocephalic and atraumatic.  Eyes:      General: No scleral icterus.    Conjunctiva/sclera: Conjunctivae normal.  Cardiovascular:     Rate and Rhythm: Normal rate and regular rhythm.  Pulmonary:     Effort: Pulmonary effort is normal. No respiratory distress.  Skin:    Findings: No rash.  Neurological:     Mental Status: She is alert.  Psychiatric:        Mood and Affect: Mood normal.     ED Results / Procedures / Treatments   Labs (all labs ordered are listed, but only abnormal results are displayed) Labs Reviewed - No data to display  EKG None  Radiology No results found.  Procedures Procedures   Medications Ordered in ED Medications - No data to display  ED Course/ Medical Decision Making/ A&P                           Medical Decision Making  56 year old female presenting today due to being cold.  History of homelessness.  Multiple ED visits.  Does not require any further lab work or imaging.  When I take the blankets off of her to complete my physical exam she screams and put them back over her head.  Per chart review patient has had conversations with social work in the past and has had numerous visits over the  past couple of weeks.  At this time she is stable for discharge home.  Final Clinical Impression(s) / ED Diagnoses Final diagnoses:  Homelessness    Rx / DC Orders ED Discharge Orders     None      Results and diagnoses were explained to the patient. Return precautions discussed in full. Patient had no additional questions and expressed complete understanding.   This chart was dictated using voice recognition software.  Despite best efforts to proofread,  errors can occur which can change the documentation meaning.    Darliss Ridgel 04/01/22 2001    Tegeler, Gwenyth Allegra, MD 04/01/22 [phone]

## 2022-04-11 ENCOUNTER — Encounter (HOSPITAL_COMMUNITY): Payer: Self-pay | Admitting: Emergency Medicine

## 2022-04-11 ENCOUNTER — Emergency Department (HOSPITAL_COMMUNITY)
Admission: EM | Admit: 2022-04-11 | Discharge: 2022-04-11 | Disposition: A | Discharge status: Home / Self Care | Payer: Medicaid Other | Attending: Emergency Medicine | Admitting: Emergency Medicine

## 2022-04-11 DIAGNOSIS — R110 Nausea: Secondary | ICD-10-CM | POA: Diagnosis present

## 2022-04-11 DIAGNOSIS — Z7984 Long term (current) use of oral hypoglycemic drugs: Secondary | ICD-10-CM | POA: Insufficient documentation

## 2022-04-11 DIAGNOSIS — Z794 Long term (current) use of insulin: Secondary | ICD-10-CM | POA: Insufficient documentation

## 2022-04-11 DIAGNOSIS — K219 Gastro-esophageal reflux disease without esophagitis: Secondary | ICD-10-CM | POA: Diagnosis not present

## 2022-04-11 MED ORDER — OLANZAPINE 10 MG PO TABS
10.0000 mg | ORAL_TABLET | ORAL | Status: AC
  Administered 2022-04-11: 10 mg via ORAL
  Filled 2022-04-11: qty 1

## 2022-04-11 MED ORDER — FAMOTIDINE 20 MG PO TABS: 20.0000 mg | ORAL_TABLET | Freq: Two times a day (BID) | ORAL | 0 refills | Status: DC

## 2022-04-11 MED ORDER — FAMOTIDINE 20 MG PO TABS
20.0000 mg | ORAL_TABLET | Freq: Once | ORAL | Status: AC
  Administered 2022-04-11: 20 mg via ORAL
  Filled 2022-04-11: qty 1

## 2022-04-11 NOTE — ED Provider Notes (Signed)
[facility] [place] EMERGENCY DEPARTMENT Provider Note   CSN: [id number] Arrival date & time: 04/11/22  1902     History  Chief Complaint  Patient presents with   Gastroesophageal Reflux    Katherine Franklin is a 56 y.o. female.   Gastroesophageal Reflux   Pt here w belching and some nausea.   She denies any pain.  No shortness of breath.  No vomiting today but has had some nonbloody nonbilious emesis yesterday.  She states she has had similar symptoms for many years.  She has not been taking her Zyprexa and has not been taking her medications for the symptoms.      Home Medications Prior to Admission medications   Medication Sig Start Date End Date Taking? Authorizing Provider  blood glucose meter kit and supplies KIT Dispense based on patient and insurance preference. Use up to four times daily as directed. 03/16/22   Charlynne Cousins, MD  insulin aspart (NOVOLOG) 100 UNIT/ML FlexPen Inject 3 Units into the skin 3 (three) times daily with meals. Patient not taking: Reported on 03/20/2022 03/16/22   Charlynne Cousins, MD  insulin detemir (LEVEMIR) 100 UNIT/ML FlexPen Inject 40 Units into the skin daily. Patient not taking: Reported on 03/20/2022 03/16/22   Charlynne Cousins, MD  Insulin Pen Needle 32G X 4 MM MISC Use 2 (two) times daily or as directed 03/16/22   Charlynne Cousins, MD  lisinopril (ZESTRIL) 20 MG tablet Take 1 tablet (20 mg total) by mouth daily. 03/16/22   Charlynne Cousins, MD  metFORMIN (GLUCOPHAGE-XR) 500 MG 24 hr tablet Take 4 tablets (2,000 mg total) by mouth every morning. 03/16/22 04/15/22  Charlynne Cousins, MD  OLANZapine (ZYPREXA) 10 MG tablet Take 1 tablet (10 mg total) by mouth 2 (two) times daily. 03/16/22   Charlynne Cousins, MD      Allergies    Sulfa antibiotics and Latex    Review of Systems   Review of Systems  Physical Exam Updated Vital Signs BP (!) 156/101 (BP Location: Left Arm)   Pulse 96   Temp 98.9 F  (37.2 C)   Resp 18   SpO2 100%  Physical Exam Vitals and nursing note reviewed.  Constitutional:      General: She is not in acute distress.    Appearance: Normal appearance. She is not ill-appearing.  HENT:     Head: Normocephalic and atraumatic.  Eyes:     General: No scleral icterus.       Right eye: No discharge.        Left eye: No discharge.     Conjunctiva/sclera: Conjunctivae normal.  Pulmonary:     Effort: Pulmonary effort is normal.     Breath sounds: No stridor.  Abdominal:     Tenderness: There is no abdominal tenderness. There is no right CVA tenderness, left CVA tenderness, guarding or rebound.  Neurological:     Mental Status: She is alert and oriented to person, place, and time. Mental status is at baseline.     ED Results / Procedures / Treatments   Labs (all labs ordered are listed, but only abnormal results are displayed) Labs Reviewed - No data to display  EKG None  Radiology No results found.  Procedures Procedures    Medications Ordered in ED Medications  OLANZapine (ZYPREXA) tablet 10 mg (has no administration in time range)  famotidine (PEPCID) tablet 20 mg (has no administration in time range)    ED Course/ Medical  Decision Making/ A&P                           Medical Decision Making Risk Prescription drug management.   Patient with frequent belching.  Overall well-appearing on exam  Given pepcid and zyprexa.   Exam is reassuring. Tolerating PO. Ambulating.   Will DC home now. Return precautions - pt given famotidine here.    Final Clinical Impression(s) / ED Diagnoses Final diagnoses:  Gastroesophageal reflux disease, unspecified whether esophagitis present    Rx / DC Orders ED Discharge Orders     None         Tedd Sias, [place] 04/11/22 2015    Lennice Sites, DO 04/11/22 2224

## 2022-04-11 NOTE — ED Triage Notes (Signed)
Pt having almost constant belching and vomiting and gas. Pt denies pain. She is homeless. States been dx with GERD before and did take medicine for it years ago. Has been going on for weeks.

## 2022-04-11 NOTE — Discharge Instructions (Signed)
I have given you a list of shelters.  I have also given you a dose of medicine that should help with reflux and belching.  I have also given you the information for the [facility] you may follow-up with them

## 2022-04-11 NOTE — ED Notes (Signed)
Pt requesting px to be sent to [facility] [address]

## 2022-04-22 ENCOUNTER — Emergency Department (HOSPITAL_COMMUNITY)
Admission: EM | Admit: 2022-04-22 | Discharge: 2022-04-22 | Discharge status: Against Medical Advice | Payer: Medicaid Other | Attending: Emergency Medicine | Admitting: Emergency Medicine

## 2022-04-22 NOTE — ED Notes (Signed)
Patient arrived by EMS and immediately started cursing and got off stretcher and left ED.

## 2022-04-22 NOTE — ED Provider Notes (Signed)
Patient was not seen or evaluated by an ED provider.  Immediately after ambulance arrival to her exam room the patient decided that she did not want to be seen or evaluated in the ED and left.  Per nursing, patient was walking quickly with her rolling suitcase and exited the ED through the waiting room.  She ambulated with a steady gait.  EMS reported to staff that the patient was without expressed SI or HI or indication for IVC hold.   Wynetta Fines, MD 04/22/22 [phone]

## 2022-04-23 ENCOUNTER — Encounter: Payer: Self-pay | Admitting: *Deleted

## 2022-04-23 ENCOUNTER — Emergency Department (HOSPITAL_COMMUNITY)
Admission: EM | Admit: 2022-04-23 | Discharge: 2022-04-23 | Disposition: A | Discharge status: Home / Self Care | Payer: Medicaid Other | Attending: Emergency Medicine | Admitting: Emergency Medicine

## 2022-04-23 ENCOUNTER — Other Ambulatory Visit: Payer: Self-pay

## 2022-04-23 ENCOUNTER — Encounter (HOSPITAL_COMMUNITY): Payer: Self-pay

## 2022-04-23 DIAGNOSIS — E119 Type 2 diabetes mellitus without complications: Secondary | ICD-10-CM | POA: Insufficient documentation

## 2022-04-23 DIAGNOSIS — T699XXA Effect of reduced temperature, unspecified, initial encounter: Secondary | ICD-10-CM | POA: Insufficient documentation

## 2022-04-23 DIAGNOSIS — I10 Essential (primary) hypertension: Secondary | ICD-10-CM | POA: Insufficient documentation

## 2022-04-23 DIAGNOSIS — X31XXXA Exposure to excessive natural cold, initial encounter: Secondary | ICD-10-CM | POA: Diagnosis not present

## 2022-04-23 DIAGNOSIS — Z5900 Homelessness unspecified: Secondary | ICD-10-CM | POA: Diagnosis not present

## 2022-04-23 NOTE — Congregational Nurse Program (Signed)
  Dept: [phone]   Congregational Nurse Program Note  Date of Encounter: 04/23/2022  Past Medical History: Past Medical History:  Diagnosis Date   Diabetes mellitus without complication (HCC)    Drug abuse ([facility]CC) 09/22/2018   GERD (gastroesophageal reflux disease)    Hypertension    PTSD (post-traumatic stress disorder)    Schizo affective schizophrenia [facility])     Encounter Details:  CNP Questionnaire - 04/23/22 1159       Questionnaire   Ask client: Do you give verbal consent for me to treat you today? Yes    Student Assistance N/A    Location Patient Served  [facility]    Visit Setting with Client Organization    Patient Status Unhoused    Insurance Medicaid    Insurance/Financial Assistance Referral N/A    Medication N/A    Medical Provider No    Screening Referrals Made N/A    Medical Referrals Made Drug/Alcohol Services    Medical Appointment Made N/A    Recently w/o PCP, now 1st time PCP visit completed due to CNs referral or appointment made N/A    Food N/A    Transportation Need transportation assistance;Provided transportation assistance    Housing/Utilities No permanent housing    Interpersonal Safety N/A    Interventions Advocate/Support;Navigate Healthcare System    Abnormal to Normal Screening Since Last CN Visit N/A    Screenings CN Performed Blood Pressure;Temperature    Did client attend any of the following based off CNs referral or appointments made? N/A    ED Visit Averted N/A    Life-Saving Intervention Made N/A           Client seen in nurse's office for f/u from ED visit. Client reports she was cold last night and went to ED and received thermal wrap. She reports she plans to use white flag to stay inside. Client reports she has been off of her "Zyprexa " a week. She is tangential in speech and denies si and hi. Client reports using cocaine. She said her medications are in a tent near [employer] and requests help with transportation to get her  medications. Discussed treatment options for substance abuse. Gave client two bus passes to get her medications. Offered to help client navigate treatment services and transportation. Explained hours of operation for [facility].  Lucillia Corson W RN CN

## 2022-04-23 NOTE — Discharge Instructions (Addendum)
You were evaluated in the Emergency Department and after careful evaluation, we did not find any emergent condition requiring admission or further testing in the hospital.  Your exam/testing today was overall reassuring.  Please return to the Emergency Department if you experience any worsening of your condition.  Thank you for allowing us to be a part of your care.  

## 2022-04-23 NOTE — ED Provider Notes (Signed)
[facility]-EMERGENCY DEPT [facility] Emergency Department Provider Note MRN:  [id number]  Arrival date & time: 04/23/22     Chief Complaint   Cold Exposure   History of Present Illness   Katherine Franklin is a 56 y.o. year-old female with a history of DM, schizoaffective disorder presenting to the ED with chief complaint of cold exposure.  Patient explains that she is homeless, has been very cold and wet over the past few days due to the weather.  Denies pain, no shortness of breath.  Explains that she continues to be addicted to crack and fentanyl, hoping to get some housing so that she can quit.  Review of Systems  A thorough review of systems was obtained and all systems are negative except as noted in the HPI and PMH.   Patient's Health History    Past Medical History:  Diagnosis Date   Diabetes mellitus without complication (HCC)    Drug abuse (HCC) 09/22/2018   GERD (gastroesophageal reflux disease)    Hypertension    PTSD (post-traumatic stress disorder)    Schizo affective schizophrenia (HCC)     Past Surgical History:  Procedure Laterality Date   SKIN GRAFT     TUBAL LIGATION      Family History  Family history unknown: Yes    Social History   Socioeconomic History   Marital status: Single    Spouse name: Not on file   Number of children: Not on file   Years of education: Not on file   Highest education level: Not on file  Occupational History   Occupation: Prostitute  Tobacco Use   Smoking status: Every Day    Packs/day: 2.00    Types: Cigarettes   Smokeless tobacco: Never  Vaping Use   Vaping Use: Never used  Substance and Sexual Activity   Alcohol use: Not Currently   Drug use: Yes    Frequency: 7.0 times per week    Types: "Crack" cocaine, Cocaine    Comment: undetermined amount   Sexual activity: Not Currently  Other Topics Concern   Not on file  Social History Narrative   ** Merged History Encounter **       Pt indicated that she lives in  [place], 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   Social Determinants of Health   Financial Resource Strain: Not on file  Food Insecurity: Not on file  Transportation Needs: Not on file  Physical Activity: Not on file  Stress: Not on file  Social Connections: Not on file  Intimate Partner Violence: Not on file     Physical Exam   Vitals:   04/23/22 0444  BP: 136/87  Pulse: 88  Resp: 18  Temp: 98.2 F (36.8 C)  SpO2: 99%    CONSTITUTIONAL: Chronically ill-appearing, NAD NEURO/PSYCH:  Alert and oriented x 3, no focal deficits EYES:  eyes equal and reactive ENT/NECK:  no LAD, no JVD CARDIO: Regular rate, well-perfused, normal S1 and S2 PULM:  CTAB no wheezing or rhonchi GI/GU:  non-distended, non-tender MSK/SPINE:  No gross deformities, no edema SKIN:  no rash, atraumatic   *Additional and/or pertinent findings included in MDM below  Diagnostic and Interventional Summary    EKG Interpretation  Date/Time:    Ventricular Rate:    PR Interval:    QRS Duration:   QT Interval:    QTC Calculation:   R Axis:     Text Interpretation:         Labs  Reviewed - No data to display  No orders to display    Medications - No data to display   Procedures  /  Critical Care Procedures  ED Course and Medical Decision Making  Initial Impression and Ddx Patient here mostly for shelter, she does not really have any bodily complaints at this time other than feeling cold.  Vital signs are reassuring, oral temp 98.2.  Unfortunately cannot provide her with shelter, will provide her with resources  Past medical/surgical history that increases complexity of ED encounter: None  Interpretation of Diagnostics Laboratory and/or imaging options to aid in the diagnosis/care of the patient were considered.  After careful history and physical examination, it was determined that there was no indication for diagnostics at this time.  Patient Reassessment and Ultimate  Disposition/Management     Discharge  Patient management required discussion with the following services or consulting groups:  None  Complexity of Problems Addressed Acute complicated illness or Injury  Additional Data Reviewed and Analyzed Further history obtained from: Past medical history and medications listed in the EMR and Prior ED visit notes  Additional Factors Impacting ED Encounter Risk None  Barth Kirks. Sedonia Small, MD [place] [email or web address]  Final Clinical Impressions(s) / ED Diagnoses     ICD-10-CM   1. Homeless  Z59.00     2. Cold exposure, initial encounter  T69.[place].Fake       ED Discharge Orders     None        Discharge Instructions Discussed with and Provided to Patient:    Discharge Instructions      You were evaluated in the Emergency Department and after careful evaluation, we did not find any emergent condition requiring admission or further testing in the hospital.  Your exam/testing today was overall reassuring.  Please return to the Emergency Department if you experience any worsening of your condition.  Thank you for allowing [place] to be a part of your care.       Maudie Flakes, MD 04/23/22 [phone]

## 2022-04-23 NOTE — ED Triage Notes (Addendum)
Pt arrives via [facility] from outside for cold exposure, The patient is homeless. The patient has been sleeping out side the last several nights, and it has been raining.

## 2022-04-28 ENCOUNTER — Emergency Department (HOSPITAL_COMMUNITY)
Admission: EM | Admit: 2022-04-28 | Discharge: 2022-04-28 | Discharge status: Against Medical Advice | Payer: Medicaid Other | Attending: Emergency Medicine | Admitting: Emergency Medicine

## 2022-04-28 DIAGNOSIS — Z5321 Procedure and treatment not carried out due to patient leaving prior to being seen by health care provider: Secondary | ICD-10-CM | POA: Diagnosis present

## 2022-04-28 NOTE — ED Notes (Signed)
Pt left AMA °

## 2022-04-28 NOTE — ED Notes (Signed)
Pt name called back for triage, no response.

## 2022-05-04 ENCOUNTER — Other Ambulatory Visit: Payer: Self-pay

## 2022-05-04 ENCOUNTER — Emergency Department (HOSPITAL_COMMUNITY)
Admission: EM | Admit: 2022-05-04 | Discharge: 2022-05-05 | Disposition: A | Discharge status: Home / Self Care | Payer: Medicaid Other | Attending: Emergency Medicine | Admitting: Emergency Medicine

## 2022-05-04 ENCOUNTER — Encounter (HOSPITAL_COMMUNITY): Payer: Self-pay

## 2022-05-04 DIAGNOSIS — Z794 Long term (current) use of insulin: Secondary | ICD-10-CM | POA: Insufficient documentation

## 2022-05-04 DIAGNOSIS — Z5900 Homelessness unspecified: Secondary | ICD-10-CM | POA: Diagnosis not present

## 2022-05-04 DIAGNOSIS — D649 Anemia, unspecified: Secondary | ICD-10-CM | POA: Insufficient documentation

## 2022-05-04 DIAGNOSIS — Z765 Malingerer [conscious simulation]: Secondary | ICD-10-CM | POA: Insufficient documentation

## 2022-05-04 DIAGNOSIS — Z20822 Contact with and (suspected) exposure to covid-19: Secondary | ICD-10-CM | POA: Insufficient documentation

## 2022-05-04 DIAGNOSIS — E1165 Type 2 diabetes mellitus with hyperglycemia: Secondary | ICD-10-CM | POA: Diagnosis not present

## 2022-05-04 DIAGNOSIS — Z91040 Latex allergy status: Secondary | ICD-10-CM | POA: Insufficient documentation

## 2022-05-04 DIAGNOSIS — R7989 Other specified abnormal findings of blood chemistry: Secondary | ICD-10-CM | POA: Insufficient documentation

## 2022-05-04 DIAGNOSIS — Z7984 Long term (current) use of oral hypoglycemic drugs: Secondary | ICD-10-CM | POA: Diagnosis not present

## 2022-05-04 LAB — COMPREHENSIVE METABOLIC PANEL
ALT: 19 U/L (ref 0–44)
AST: 24 U/L (ref 15–41)
Albumin: 3.8 g/dL (ref 3.5–5.0)
Alkaline Phosphatase: 84 U/L (ref 38–126)
Anion gap: 10 (ref 5–15)
BUN: 26 mg/dL — ABNORMAL HIGH (ref 6–20)
CO2: 28 mmol/L (ref 22–32)
Calcium: 9.3 mg/dL (ref 8.9–10.3)
Chloride: 98 mmol/L (ref 98–111)
Creatinine, Ser: 0.94 mg/dL (ref 0.44–1.00)
GFR, Estimated: >60 mL/min (ref >60)
Glucose, Bld: 329 mg/dL — ABNORMAL HIGH (ref 70–99)
Potassium: 3.7 mmol/L (ref 3.5–5.1)
Sodium: 136 mmol/L (ref 135–145)
Total Bilirubin: 0.8 mg/dL (ref 0.3–1.2)
Total Protein: 7.2 g/dL (ref 6.5–8.1)

## 2022-05-04 LAB — CBC WITH DIFFERENTIAL/PLATELET
Abs Immature Granulocytes: 0.01 10*3/uL (ref 0.00–0.07)
Basophils Absolute: 0.1 10*3/uL (ref 0.0–0.1)
Basophils Relative: 1 %
Eosinophils Absolute: 0.2 10*3/uL (ref 0.0–0.5)
Eosinophils Relative: 3 %
HCT: 36.9 % (ref 36.0–46.0)
Hemoglobin: 11.7 g/dL — ABNORMAL LOW (ref 12.0–15.0)
Immature Granulocytes: 0 %
Lymphocytes Relative: 32 %
Lymphs Abs: 2 10*3/uL (ref 0.7–4.0)
MCH: 27 pg (ref 26.0–34.0)
MCHC: 31.7 g/dL (ref 30.0–36.0)
MCV: 85 fL (ref 80.0–100.0)
Monocytes Absolute: 0.5 10*3/uL (ref 0.1–1.0)
Monocytes Relative: 7 %
Neutro Abs: 3.7 10*3/uL (ref 1.7–7.7)
Neutrophils Relative %: 57 %
Platelets: 313 10*3/uL (ref 150–400)
RBC: 4.34 MIL/uL (ref 3.87–5.11)
RDW: 14.1 % (ref 11.5–15.5)
WBC: 6.4 10*3/uL (ref 4.0–10.5)
nRBC: 0 % (ref 0.0–0.2)

## 2022-05-04 LAB — CBG MONITORING, ED: Glucose-Capillary: 361 mg/dL — ABNORMAL HIGH (ref 70–99)

## 2022-05-04 NOTE — ED Triage Notes (Addendum)
Pt to ED via [facility]. Pt requested being transported d/t high blood sugar. Pt is homeless and was kicked off bus today.   Pt states she has been feeling chilled and feels like her sugar is high. Pt states she feels funny.  Pt c/o pain in feet. Pt states she has not had her meds.  EMS VS Cbg=319 140/86 HR=90 RR=16 99%RA

## 2022-05-04 NOTE — ED Provider Triage Note (Signed)
Emergency Medicine Provider Triage Evaluation Note  Katherine Franklin , a 56 y.o. female  was evaluated in triage.  Pt complains of high blood sugar, no access to her insulin due to her homelessness, and having been kicked off the bus. Reporting chills, cough, requesting something to eat.   Review of Systems  Positive: As above Negative: CP, SOB, D  Physical Exam  There were no vitals taken for this visit. Gen:   Awake, no distress   Resp:  Normal effort  MSK:   Moves extremities without difficulty  Other:  RRR no m/r/g  Medical Decision Making  Medically screening exam initiated at 10:36 PM.  Appropriate orders placed.  Katherine Franklin was informed that the remainder of the evaluation will be completed by another provider, this initial triage assessment does not replace that evaluation, and the importance of remaining in the ED until their evaluation is complete.   This chart was dictated using voice recognition software, Dragon. Despite the best efforts of this provider to proofread and correct errors, errors may still occur which can change documentation meaning.    Paris Lore, PA-C 05/04/22 2242

## 2022-05-05 LAB — URINALYSIS, ROUTINE W REFLEX MICROSCOPIC
Bacteria, UA: NONE SEEN
Bilirubin Urine: NEGATIVE
Glucose, UA: >=500 mg/dL — AB
Hgb urine dipstick: NEGATIVE
Ketones, ur: NEGATIVE mg/dL
Leukocytes,Ua: NEGATIVE
Nitrite: NEGATIVE
Protein, ur: NEGATIVE mg/dL
Specific Gravity, Urine: 1.025 (ref 1.005–1.030)
pH: 7 (ref 5.0–8.0)

## 2022-05-05 LAB — RESP PANEL BY RT-PCR (RSV, FLU A&B, COVID)  RVPGX2
Influenza A by PCR: NEGATIVE
Influenza B by PCR: NEGATIVE
Resp Syncytial Virus by PCR: NEGATIVE
SARS Coronavirus 2 by RT PCR: NEGATIVE

## 2022-05-05 NOTE — ED Notes (Signed)
Pt raising voice and removing clothes in lobby, security called. Pt put clothes back on and walked out

## 2022-05-05 NOTE — ED Provider Notes (Signed)
[facility] EMERGENCY DEPARTMENT Provider Note   CSN: [id number] Arrival date & time: 05/04/22  2213     History  Chief Complaint  Patient presents with   Chills    Katherine Franklin is a 56 y.o. female who presents via EMS reporting many complaints.  Primary concern is that she was kicked off of the bus today and has been cold since being outside.  Patient has been without her insulin for some time as she is homeless, patient repeatedly telling this provider that I "would not survive on the street", difficult to redirect.  Demanding coffee and refusing to participate in the remainder of evaluation until she receives it.  Patient with frequent ED visits, well-known to this department, without clear acute concerns today, however was reported to have CBG greater than 300 with EMS.  I personally reviewed this patient's medical records.  Has history of homelessness, polysubstance use, type 2 diabetes which is currently unmanaged.  HPI     Home Medications Prior to Admission medications   Medication Sig Start Date End Date Taking? Authorizing Provider  blood glucose meter kit and supplies KIT Dispense based on patient and insurance preference. Use up to four times daily as directed. 03/16/22   Charlynne Cousins, MD  famotidine (PEPCID) 20 MG tablet Take 1 tablet (20 mg total) by mouth 2 (two) times daily. 04/11/22   Tedd Sias, PA  insulin aspart (NOVOLOG) 100 UNIT/ML FlexPen Inject 3 Units into the skin 3 (three) times daily with meals. Patient not taking: Reported on 03/20/2022 03/16/22   Charlynne Cousins, MD  insulin detemir (LEVEMIR) 100 UNIT/ML FlexPen Inject 40 Units into the skin daily. Patient not taking: Reported on 03/20/2022 03/16/22   Charlynne Cousins, MD  Insulin Pen Needle 32G X 4 MM MISC Use 2 (two) times daily or as directed 03/16/22   Charlynne Cousins, MD  lisinopril (ZESTRIL) 20 MG tablet Take 1 tablet (20 mg total) by mouth daily. 03/16/22    Charlynne Cousins, MD  metFORMIN (GLUCOPHAGE-XR) 500 MG 24 hr tablet Take 4 tablets (2,000 mg total) by mouth every morning. 03/16/22 04/15/22  Charlynne Cousins, MD  OLANZapine (ZYPREXA) 10 MG tablet Take 1 tablet (10 mg total) by mouth 2 (two) times daily. 03/16/22   Charlynne Cousins, MD      Allergies    Sulfa antibiotics and Latex    Review of Systems   Review of Systems  Constitutional:  Positive for chills. Negative for fatigue and fever.       High blood sugar  All other systems reviewed and are negative.   Physical Exam Updated Vital Signs Ht _0  (1.753 m)   Wt 104.3 kg   BMI 33.97 kg/m  Physical Exam Vitals and nursing note reviewed.  Constitutional:      Appearance: She is not ill-appearing or toxic-appearing.  HENT:     Head: Normocephalic and atraumatic.     Mouth/Throat:     Mouth: Mucous membranes are moist.     Pharynx: No oropharyngeal exudate or posterior oropharyngeal erythema.  Eyes:     General:        Right eye: No discharge.        Left eye: No discharge.     Conjunctiva/sclera: Conjunctivae normal.  Cardiovascular:     Rate and Rhythm: Normal rate and regular rhythm.     Pulses: Normal pulses.     Heart sounds: Normal heart sounds. No murmur heard.  Pulmonary:     Effort: Pulmonary effort is normal. No respiratory distress.     Breath sounds: Normal breath sounds. No wheezing or rales.  Abdominal:     General: Bowel sounds are normal. There is no distension.     Palpations: Abdomen is soft.     Tenderness: There is no abdominal tenderness. There is no guarding or rebound.  Musculoskeletal:        General: No deformity.     Cervical back: Neck supple.     Right lower leg: No edema.     Left lower leg: No edema.  Skin:    General: Skin is warm and dry.     Capillary Refill: Capillary refill takes less than 2 seconds.  Neurological:     General: No focal deficit present.     Mental Status: She is alert and oriented to person, place,  and time. Mental status is at baseline.  Psychiatric:        Mood and Affect: Mood normal.     ED Results / Procedures / Treatments   Labs (all labs ordered are listed, but only abnormal results are displayed) Labs Reviewed  CBC WITH DIFFERENTIAL/PLATELET - Abnormal; Notable for the following components:      Result Value   Hemoglobin 11.7 (*)    All other components within normal limits  COMPREHENSIVE METABOLIC PANEL - Abnormal; Notable for the following components:   Glucose, Bld 329 (*)    BUN 26 (*)    All other components within normal limits  URINALYSIS, ROUTINE W REFLEX MICROSCOPIC - Abnormal; Notable for the following components:   Glucose, UA >=500 (*)    All other components within normal limits  CBG MONITORING, ED - Abnormal; Notable for the following components:   Glucose-Capillary 361 (*)    All other components within normal limits  RESP PANEL BY RT-PCR (RSV, FLU A&B, COVID)  [id number]    EKG None  Radiology No results found.  Procedures Procedures   Medications Ordered in ED Medications - No data to display  ED Course/ Medical Decision Making/ A&P Clinical Course as of 05/05/22 0728  Fri May 04, 2022  2302 CBG 361, per ED tech Ro[place]. [RS]    Clinical Course User Index [RS] Pearson Reasons, Gypsy Balsam, PA-C                           Medical Decision Making  56 year old female presents with multiple complaints but no obvious acute concerns today.  Patient refused vital signs in triage.  Physical exam is reassuring, labs reassuirng. Patient without acute medical concern.    Amount and/or Complexity of Data Reviewed Labs: ordered.    Details: CBC without leukocytosis, mild anemia with hemoglobin of 11.7.  CMP with elevated BUN to 26, elevated glucose to 329, no anion gap.  UA with glucosuria, otherwise unremarkable, CBG 361.  RVP pan negative.    I was notified by ED tech that patient has taken off all of her clothing and defecated on the floor,  making a scene in the waiting room, demanding more coffee.  I have physically examined this patient and reviewed her laboratory studies and found her to be medically stable for discharge at this time.  No further comport in the ED as time.  This chart was dictated using voice recognition software, Dragon. Despite the best efforts of this provider to proofread and correct errors, errors may still occur which can change documentation  meaning.    Final Clinical Impression(s) / ED Diagnoses Final diagnoses:  Malingering    Rx / DC Orders ED Discharge Orders     None         Aura Dials 50/72/25 7505    Delora Fuel, MD [birth year] [phone]

## 2022-05-05 NOTE — Discharge Instructions (Addendum)
Physical exam and vital signs were reassuring.  Return with any severe symptoms.

## 2022-05-11 ENCOUNTER — Ambulatory Visit (INDEPENDENT_AMBULATORY_CARE_PROVIDER_SITE_OTHER): Payer: Medicaid Other | Admitting: Psychiatry

## 2022-05-11 VITALS — BP 116/77 | HR 101

## 2022-05-11 DIAGNOSIS — F1420 Cocaine dependence, uncomplicated: Secondary | ICD-10-CM

## 2022-05-11 NOTE — Progress Notes (Addendum)
Walk in   Patient is a 56 year old female with past psychiatric history of schizoaffective disorder cocaine abuse, and PTSD presented for walk in assessment.   She states that she is homeless and crack addict. She states she needs an address for her disability check so that she can get her medication.  She reports that she was on Zyprexa but she has not been able to afford it.  She is asking if she can use this address for her disability check.  She denies active or past suicidal ideations, homicidal ideations, auditory and visual hallucinations. Doesn't appear paranoid. Discussed that she cannot use this address for her disability check.  She stood up and states " I do not want to waste your time and I am going to leave". Encouraged patient to complete evaluation but Patient left office without full psychiatric evaluation.   Karsten Ro, MD PGY3 Psychiatry Resident  [facility]

## 2022-05-12 ENCOUNTER — Emergency Department (HOSPITAL_COMMUNITY)
Admission: EM | Admit: 2022-05-12 | Discharge: 2022-05-12 | Discharge status: Against Medical Advice | Payer: Medicaid Other | Attending: Emergency Medicine | Admitting: Emergency Medicine

## 2022-05-12 DIAGNOSIS — Z5329 Procedure and treatment not carried out because of patient's decision for other reasons: Secondary | ICD-10-CM | POA: Diagnosis not present

## 2022-05-12 DIAGNOSIS — Z5900 Homelessness unspecified: Secondary | ICD-10-CM | POA: Insufficient documentation

## 2022-05-12 DIAGNOSIS — Z794 Long term (current) use of insulin: Secondary | ICD-10-CM | POA: Diagnosis not present

## 2022-05-12 DIAGNOSIS — R739 Hyperglycemia, unspecified: Secondary | ICD-10-CM | POA: Insufficient documentation

## 2022-05-12 DIAGNOSIS — Y900 Blood alcohol level of less than 20 mg/100 ml: Secondary | ICD-10-CM | POA: Diagnosis not present

## 2022-05-12 DIAGNOSIS — Z91040 Latex allergy status: Secondary | ICD-10-CM | POA: Diagnosis not present

## 2022-05-12 DIAGNOSIS — F1410 Cocaine abuse, uncomplicated: Secondary | ICD-10-CM | POA: Insufficient documentation

## 2022-05-12 LAB — COMPREHENSIVE METABOLIC PANEL
ALT: 18 U/L (ref 0–44)
AST: 19 U/L (ref 15–41)
Albumin: 3.8 g/dL (ref 3.5–5.0)
Alkaline Phosphatase: 87 U/L (ref 38–126)
Anion gap: 10 (ref 5–15)
BUN: 14 mg/dL (ref 6–20)
CO2: 29 mmol/L (ref 22–32)
Calcium: 9.2 mg/dL (ref 8.9–10.3)
Chloride: 98 mmol/L (ref 98–111)
Creatinine, Ser: 1.02 mg/dL — ABNORMAL HIGH (ref 0.44–1.00)
GFR, Estimated: >60 mL/min (ref >60)
Glucose, Bld: 281 mg/dL — ABNORMAL HIGH (ref 70–99)
Potassium: 4.2 mmol/L (ref 3.5–5.1)
Sodium: 137 mmol/L (ref 135–145)
Total Bilirubin: 0.4 mg/dL (ref 0.3–1.2)
Total Protein: 7.4 g/dL (ref 6.5–8.1)

## 2022-05-12 LAB — CBC
HCT: 38.9 % (ref 36.0–46.0)
Hemoglobin: 12.6 g/dL (ref 12.0–15.0)
MCH: 27 pg (ref 26.0–34.0)
MCHC: 32.4 g/dL (ref 30.0–36.0)
MCV: 83.3 fL (ref 80.0–100.0)
Platelets: 296 10*3/uL (ref 150–400)
RBC: 4.67 MIL/uL (ref 3.87–5.11)
RDW: 14 % (ref 11.5–15.5)
WBC: 4.2 10*3/uL (ref 4.0–10.5)
nRBC: 0 % (ref 0.0–0.2)

## 2022-05-12 LAB — RAPID URINE DRUG SCREEN, HOSP PERFORMED
Amphetamines: NOT DETECTED
Barbiturates: NOT DETECTED
Benzodiazepines: NOT DETECTED
Cocaine: POSITIVE — AB
Opiates: NOT DETECTED
Tetrahydrocannabinol: NOT DETECTED

## 2022-05-12 LAB — ETHANOL: Alcohol, Ethyl (B): <10 mg/dL (ref <10)

## 2022-05-12 LAB — CBG MONITORING, ED: Glucose-Capillary: 276 mg/dL — ABNORMAL HIGH (ref 70–99)

## 2022-05-12 LAB — SALICYLATE LEVEL: Salicylate Lvl: <7 mg/dL — ABNORMAL LOW (ref 7.0–30.0)

## 2022-05-12 LAB — ACETAMINOPHEN LEVEL: Acetaminophen (Tylenol), Serum: <10 ug/mL — ABNORMAL LOW (ref 10–30)

## 2022-05-12 MED ORDER — SODIUM CHLORIDE 0.9 % IV BOLUS: 1000.0000 mL | Freq: Once | INTRAVENOUS | Status: DC

## 2022-05-12 NOTE — Discharge Instructions (Addendum)
                Intensive Outpatient Programs  [facility]    [facility] [address]     [address] [place],  [place]     [place], [place] [phone]      [phone]  [facility]   [facility]  (Inpatient and outpatient)  [phone] (Suboxone and Methadone) [address]           [phone]           [facility]: [facility]    [facility] - Intensive Outpatient [address]     [address] [place], [place] [address]     [place], [place]  [phone]      [phone]  [facility] (Outpatient, Inpatient, Chemical  [facility] (Groups and Residental) (insurance only) [phone]    [place], [place]          [phone]       [facility]    [facility] (for caregivers and family) [address]     [address] [place], [place]     [place], [place] [phone]      [phone]  Residential Treatment Programs  [facility] [facility]  [facility](2 years) Residential: 90 days)  [facility] ([facility] [address]      [address] [place], [place]     [place], [place] [phone]      [phone] or [phone]  [facility]    [facility] [address]      [address] [place], [place]     [place], [place] [phone]      [phone]  [facility]   [facility] ([facility]) [address]     [address] [place], [place] [address]     [place], [place] [phone]      [phone] Admissions: 8am-3pm M-F  [facility]: Residential Program (90 Days)              [facility]: [facility]  [place], [place]     [place],   [phone] or [phone]    (Walk in Hours over the weekend or by referral)   [facility]: [facility]:1877-626-1772 (for crisis  response 24 hours a day  [facility] ([facility]) Monday - Friday 8am - 3pm          Sat & Sun 8am - 2pm [address] [place],  [address]   [phone]     www.[facility]resource[facility].org IRC offers among other critical resources: showers, laundry, barbershop, phone bank, mailroom, computer lab, medical clinic, gardens and a bike maintenance area.   AREA SHELTERS  [facility]  (Men & women) [address] [place] [phone]  [facility] (Men/women/families) [address] [place] [phone] x3   [facility] (Families with children) [address]  [place] [phone]   [facility] (Domestic Violence Shelter) [address]  [place] [phone]   [facility] (Children ages 7-17) [address]  [place] [phone]   [facility]   (Women & children) [address] [place] [phone]   [facility] (Women/substance abuse) [address]  [place] [phone]   [facility] (Men) [address]   (  [phone]   [facility] (Men) [address]  [place] [phone]  [facility] (Women) [address]  [place] [phone]   [facility] (Single women & women with children) [address]  [place] ([phone]  [facility] (Men/women/families) [address]  [place] ([phone]    [facility]   (Domestic Violence shelter) [address]  [place] ([phone]   [facility] (Men & women) [address]  [place] ([phone]  [facility] (Men) [address]  [place] [phone]   [place] [facility] (Men) [address]  [place] [phone]   [facility] (Single women & families) [address]  [place] [phone]  [facility] (Men/women &  families) [address]  [place] [phone]    If you are at risk of losing your housing (throughout [place]) call the Housing Hotline at (336)691-9521. You may also contact 2-1-1, a FREE service of the [employer] that provides information about many resources including housing. Dial 211, or visit online at www.nc211.org. [place]:  [facility], contact Steve Adams [phone] (men only)                          [facility] in [place]:  90 day homeless program for women and men;                             contact Rev. Chambers [phone]  [place]:  [facility]:  men/women/children 910-892-5772  [place]:  Shelter in [place], Pastor Kivett [phone]                       [facility] in [place], Santiago Lopez [phone]   [place]:  [facility] Abused Women, 910-572-3749; (women and children)  [place]:  [facility], men/women/children; 910-246-0122                           [facility] in [place], [phone]; substance abuse halfway house for men              [facility]; 4 bedroom house in [place] for homeless women, contact Elaine Owens [phone]               [facility] in [place], Susan Bellow, [phone] (women and children)               [facility], for abused women and children, 24 hour crisis line, 910-947-3333, Anne Friesen  [facility], halfway house for women, [place], [phone]  [place]:  [facility], [phone]; open Mon-Thurs from Jan14 - March 15 when temp is below 32 degrees                              [facility]; Pastor Jeff Looney, [phone]; cell [phone]; open 24/7  [place]:  [facility] - Rev Taylor - [phone]  [place]:  transitional housing for women and children; Sabrina Hough [phone]  

## 2022-05-12 NOTE — TOC Initial Note (Addendum)
Transition of Care [facility]) - Initial/Assessment Note    Patient Details  Name: Katherine Franklin MRN: [id number] Date of Birth: 29-Sep-1965  [facility]) CM/SW Contact:    Lockie Pares, RN Phone Number: 05/12/2022, 3:20 PM  Clinical Narrative:                 Patient is frequently in the ED, 32 times in the last 6 months,  with malingering and homelessness ( looking for a place to sleep) resources given to patient multiple times. Still does not have a PCP, and is still using drugs.   Will continue to offer resources to patient ( on AVS) Nothing further to offer. Discussed with Dr. Lockie Mola  Expected Discharge Plan: Homeless Shelter Barriers to Discharge: Homeless with medical needs (homelesss, patient does not follow up)   Patient Goals and CMS Choice            Expected Discharge Plan and Services    DC   Living arrangements for the past 2 months: Homeless                                      Prior Living Arrangements/Services Living arrangements for the past 2 months: Homeless Lives with:: Self                   Activities of Daily Living      Permission Sought/Granted                  Emotional Assessment              Admission diagnosis:  behavioral Patient Active Problem List   Diagnosis Date Noted   Involuntary commitment    Gangrene (HCC) 03/08/2022   Psychosis (HCC) 03/07/2022   Type 2 diabetes mellitus (HCC) 03/07/2022   Dry gangrene toes of bilateral feet (HCC) 03/07/2022   Inadequate community resources 02/26/2022   Cocaine use disorder (HCC) 02/26/2022   Cellulitis 04/19/2019   Substance induced mood disorder (HCC) 04/09/2019   Homelessness 04/09/2019   History of posttraumatic stress disorder (PTSD) 04/09/2019   Cocaine dependence (HCC) 08/18/2018   Major depressive disorder, recurrent severe without psychotic features (HCC) 08/18/2018   PCP:  Patient, No Pcp Per Pharmacy:   [facility] [phone]  - [place], [place] - [address] AT [facility] [address] [place] [address] Phone: [phone] Fax: [phone]  [id number] - [place], [place] - [address] [place] [address] Phone: [phone] Fax: [phone]  [facility] [address] [place] [address] Phone: [phone] Fax: [phone]  [name] [address] [place] [address] Phone: [phone] Fax: [phone]     Social Determinants of Health (SDOH) Social History: SDOH Screenings   Alcohol Screen: Low Risk  (04/28/2019)  Tobacco Use: High Risk (05/04/2022)   SDOH Interventions:     Readmission Risk Interventions     No data to display

## 2022-05-12 NOTE — ED Notes (Signed)
Patient ambulatory out of ED prior to discharge due to lack of fried chicken being provided. EDP aware.

## 2022-05-12 NOTE — ED Triage Notes (Signed)
Patient BIB [facility] from home for evaluation of hyperglycemia for the last seven years and requesting help with her crack addiction. Patient is alert, oriented, and in no apparent distress at this time.  BP 170/100

## 2022-05-12 NOTE — ED Provider Notes (Addendum)
[facility] EMERGENCY DEPARTMENT Provider Note   CSN: [id number] Arrival date & time: 05/12/22  1406     History  Chief Complaint  Patient presents with   Hyperglycemia    Katherine Franklin is a 56 y.o. female.  Patient here for food and social work.  She has no complaints physically.  She wants refills of her medications.  She is homeless.  She has no chest pain or shortness of breath and no physical symptoms.  She continues to abuse crack cocaine.  She denies any headache, chest pain.  No SI or HI or AVH.  The history is provided by the patient.       Home Medications Prior to Admission medications   Medication Sig Start Date End Date Taking? Authorizing Provider  blood glucose meter kit and supplies KIT Dispense based on patient and insurance preference. Use up to four times daily as directed. 03/16/22   Charlynne Cousins, MD  famotidine (PEPCID) 20 MG tablet Take 1 tablet (20 mg total) by mouth 2 (two) times daily. 04/11/22   Tedd Sias, PA  insulin aspart (NOVOLOG) 100 UNIT/ML FlexPen Inject 3 Units into the skin 3 (three) times daily with meals. Patient not taking: Reported on 03/20/2022 03/16/22   Charlynne Cousins, MD  insulin detemir (LEVEMIR) 100 UNIT/ML FlexPen Inject 40 Units into the skin daily. Patient not taking: Reported on 03/20/2022 03/16/22   Charlynne Cousins, MD  Insulin Pen Needle 32G X 4 MM MISC Use 2 (two) times daily or as directed 03/16/22   Charlynne Cousins, MD  lisinopril (ZESTRIL) 20 MG tablet Take 1 tablet (20 mg total) by mouth daily. 03/16/22   Charlynne Cousins, MD  metFORMIN (GLUCOPHAGE-XR) 500 MG 24 hr tablet Take 4 tablets (2,000 mg total) by mouth every morning. 03/16/22 04/15/22  Charlynne Cousins, MD  OLANZapine (ZYPREXA) 10 MG tablet Take 1 tablet (10 mg total) by mouth 2 (two) times daily. 03/16/22   Charlynne Cousins, MD      Allergies    Sulfa antibiotics and Latex    Review of Systems   Review of  Systems  Physical Exam Updated Vital Signs BP (!) 137/97   Pulse 97   Temp 99.2 F (37.3 C)   Resp 16   SpO2 100%  Physical Exam Vitals and nursing note reviewed.  Constitutional:      General: She is not in acute distress.    Appearance: She is well-developed.  HENT:     Head: Normocephalic and atraumatic.     Nose: Nose normal.     Mouth/Throat:     Mouth: Mucous membranes are moist.  Eyes:     Extraocular Movements: Extraocular movements intact.     Conjunctiva/sclera: Conjunctivae normal.     Pupils: Pupils are equal, round, and reactive to light.  Cardiovascular:     Rate and Rhythm: Normal rate and regular rhythm.     Pulses: Normal pulses.     Heart sounds: Normal heart sounds. No murmur heard. Pulmonary:     Effort: Pulmonary effort is normal. No respiratory distress.     Breath sounds: Normal breath sounds.  Abdominal:     Palpations: Abdomen is soft.     Tenderness: There is no abdominal tenderness.  Musculoskeletal:        General: No swelling. Normal range of motion.     Cervical back: Neck supple.  Skin:    General: Skin is warm and dry.  Capillary Refill: Capillary refill takes less than 2 seconds.  Neurological:     General: No focal deficit present.     Mental Status: She is alert.  Psychiatric:        Mood and Affect: Mood normal.     ED Results / Procedures / Treatments   Labs (all labs ordered are listed, but only abnormal results are displayed) Labs Reviewed  COMPREHENSIVE METABOLIC PANEL - Abnormal; Notable for the following components:      Result Value   Glucose, Bld 281 (*)    Creatinine, Ser 1.02 (*)    All other components within normal limits  SALICYLATE LEVEL - Abnormal; Notable for the following components:   Salicylate Lvl <3.8 (*)    All other components within normal limits  ACETAMINOPHEN LEVEL - Abnormal; Notable for the following components:   Acetaminophen (Tylenol), Serum <10 (*)    All other components within normal  limits  RAPID URINE DRUG SCREEN, HOSP PERFORMED - Abnormal; Notable for the following components:   Cocaine POSITIVE (*)    All other components within normal limits  CBG MONITORING, ED - Abnormal; Notable for the following components:   Glucose-Capillary 276 (*)    All other components within normal limits  ETHANOL  CBC    EKG None  Radiology No results found.  Procedures Procedures    Medications Ordered in ED Medications - No data to display  ED Course/ Medical Decision Making/ A&P                           Medical Decision Making Amount and/or Complexity of Data Reviewed Labs: ordered.   Glenys Mofield is here for social issues.  She only asks me for food and for social work consult.  She would like help with her medications and would like food.  Social work consult and case management consult has been completed.  She has been given resources for housing and.  She has been her arRange to follow-up with wellness center multiple times in the past as well.  Blood work was done in triage and per my review and interpretation she has no significant anemia or electrolyte abnormality.  She is not in DKA.  Overall she appears well.  Has no physical complaints.  She is given something to eat and drink.  Social work consultation was completed.  Patient eloped.  This chart was dictated using voice recognition software.  Despite best efforts to proofread,  errors can occur which can change the documentation meaning.         Final Clinical Impression(s) / ED Diagnoses Final diagnoses:  Hyperglycemia    Rx / DC Orders ED Discharge Orders     None         Lennice Sites, DO 05/12/22 [place], [place], DO 05/12/22 [phone]

## 2022-05-12 NOTE — ED Provider Triage Note (Signed)
Emergency Medicine Provider Triage Evaluation Note  Katherine Franklin , a 56 y.o. female  was evaluated in triage.  Pt complains of drug abuse. Pt is homeless, report wanting help with crack addiction.  Furthermore, report concerns of elevated CBG.  Hx of DM and sts she doesn't have her medications and concerns that her blood sugar is high.  Report feeling "cloudy headed" today.  Also report needing help with social worker to find shelter since she is homeless.  No SI/HI/AVH.  Endorse nausea without vomiting.  No cold sxs  Review of Systems  Positive: As above Negative: As above  Physical Exam  BP (!) 137/97   Pulse 97   Temp 99.2 F (37.3 C)   Resp 16   SpO2 100%  Gen:   Awake, no distress   Resp:  Normal effort  MSK:   Moves extremities without difficulty  Other:    Medical Decision Making  Medically screening exam initiated at 2:12 PM.  Appropriate orders placed.  Ofilia Guidry was informed that the remainder of the evaluation will be completed by another provider, this initial triage assessment does not replace that evaluation, and the importance of remaining in the ED until their evaluation is complete.     Fayrene Helper, PA-C 05/12/22 1424

## 2022-05-15 ENCOUNTER — Encounter (HOSPITAL_COMMUNITY): Payer: Self-pay | Admitting: Psychiatry

## 2022-05-24 ENCOUNTER — Other Ambulatory Visit: Payer: Self-pay

## 2022-05-24 ENCOUNTER — Emergency Department (HOSPITAL_COMMUNITY)
Admission: EM | Admit: 2022-05-24 | Discharge: 2022-05-25 | Disposition: A | Discharge status: Home / Self Care | Payer: Medicaid Other | Source: Home / Self Care | Attending: Emergency Medicine | Admitting: Emergency Medicine

## 2022-05-24 ENCOUNTER — Encounter (HOSPITAL_COMMUNITY): Payer: Self-pay | Admitting: Emergency Medicine

## 2022-05-24 ENCOUNTER — Emergency Department (HOSPITAL_COMMUNITY)
Admission: EM | Admit: 2022-05-24 | Discharge: 2022-05-24 | Disposition: A | Discharge status: Home / Self Care | Payer: Medicaid Other | Attending: Emergency Medicine | Admitting: Emergency Medicine

## 2022-05-24 ENCOUNTER — Encounter (HOSPITAL_COMMUNITY): Payer: Self-pay

## 2022-05-24 DIAGNOSIS — R739 Hyperglycemia, unspecified: Secondary | ICD-10-CM

## 2022-05-24 DIAGNOSIS — Z794 Long term (current) use of insulin: Secondary | ICD-10-CM | POA: Insufficient documentation

## 2022-05-24 DIAGNOSIS — H1033 Unspecified acute conjunctivitis, bilateral: Secondary | ICD-10-CM | POA: Insufficient documentation

## 2022-05-24 DIAGNOSIS — Z91040 Latex allergy status: Secondary | ICD-10-CM | POA: Insufficient documentation

## 2022-05-24 DIAGNOSIS — Z7984 Long term (current) use of oral hypoglycemic drugs: Secondary | ICD-10-CM | POA: Insufficient documentation

## 2022-05-24 DIAGNOSIS — Z5900 Homelessness unspecified: Secondary | ICD-10-CM | POA: Insufficient documentation

## 2022-05-24 DIAGNOSIS — F17200 Nicotine dependence, unspecified, uncomplicated: Secondary | ICD-10-CM | POA: Insufficient documentation

## 2022-05-24 DIAGNOSIS — F1490 Cocaine use, unspecified, uncomplicated: Secondary | ICD-10-CM | POA: Insufficient documentation

## 2022-05-24 DIAGNOSIS — G629 Polyneuropathy, unspecified: Secondary | ICD-10-CM | POA: Diagnosis not present

## 2022-05-24 LAB — CBC WITH DIFFERENTIAL/PLATELET
Abs Immature Granulocytes: 0.01 10*3/uL (ref 0.00–0.07)
Basophils Absolute: 0 10*3/uL (ref 0.0–0.1)
Basophils Relative: 1 %
Eosinophils Absolute: 0.1 10*3/uL (ref 0.0–0.5)
Eosinophils Relative: 2 %
HCT: 36.8 % (ref 36.0–46.0)
Hemoglobin: 11.8 g/dL — ABNORMAL LOW (ref 12.0–15.0)
Immature Granulocytes: 0 %
Lymphocytes Relative: 28 %
Lymphs Abs: 1.6 10*3/uL (ref 0.7–4.0)
MCH: 26.9 pg (ref 26.0–34.0)
MCHC: 32.1 g/dL (ref 30.0–36.0)
MCV: 83.8 fL (ref 80.0–100.0)
Monocytes Absolute: 0.5 10*3/uL (ref 0.1–1.0)
Monocytes Relative: 8 %
Neutro Abs: 3.6 10*3/uL (ref 1.7–7.7)
Neutrophils Relative %: 61 %
Platelets: 293 10*3/uL (ref 150–400)
RBC: 4.39 MIL/uL (ref 3.87–5.11)
RDW: 13.7 % (ref 11.5–15.5)
WBC: 5.9 10*3/uL (ref 4.0–10.5)
nRBC: 0 % (ref 0.0–0.2)

## 2022-05-24 LAB — BASIC METABOLIC PANEL
Anion gap: 10 (ref 5–15)
BUN: 20 mg/dL (ref 6–20)
CO2: 30 mmol/L (ref 22–32)
Calcium: 9.2 mg/dL (ref 8.9–10.3)
Chloride: 98 mmol/L (ref 98–111)
Creatinine, Ser: 0.89 mg/dL (ref 0.44–1.00)
GFR, Estimated: >60 mL/min (ref >60)
Glucose, Bld: 267 mg/dL — ABNORMAL HIGH (ref 70–99)
Potassium: 4.7 mmol/L (ref 3.5–5.1)
Sodium: 138 mmol/L (ref 135–145)

## 2022-05-24 LAB — CBG MONITORING, ED
Glucose-Capillary: 192 mg/dL — ABNORMAL HIGH (ref 70–99)
Glucose-Capillary: 246 mg/dL — ABNORMAL HIGH (ref 70–99)

## 2022-05-24 MED ORDER — INSULIN GLARGINE-YFGN 100 UNIT/ML ~~LOC~~ SOLN
15.0000 [IU] | Freq: Every day | SUBCUTANEOUS | Status: DC
  Administered 2022-05-25: 15 [IU] via SUBCUTANEOUS
  Filled 2022-05-24: qty 0.15

## 2022-05-24 MED ORDER — OLANZAPINE 10 MG PO TABS
10.0000 mg | ORAL_TABLET | Freq: Two times a day (BID) | ORAL | Status: DC
  Administered 2022-05-25: 10 mg via ORAL
  Filled 2022-05-24: qty 1

## 2022-05-24 MED ORDER — ACETAMINOPHEN 325 MG PO TABS
650.0000 mg | ORAL_TABLET | Freq: Once | ORAL | Status: AC
  Administered 2022-05-25: 650 mg via ORAL
  Filled 2022-05-24: qty 2

## 2022-05-24 MED ORDER — IBUPROFEN 400 MG PO TABS
600.0000 mg | ORAL_TABLET | Freq: Once | ORAL | Status: AC
  Administered 2022-05-25: 600 mg via ORAL
  Filled 2022-05-24: qty 1

## 2022-05-24 MED ORDER — GABAPENTIN 100 MG PO CAPS
100.0000 mg | ORAL_CAPSULE | Freq: Once | ORAL | Status: AC
  Administered 2022-05-25: 100 mg via ORAL
  Filled 2022-05-24: qty 1

## 2022-05-24 NOTE — ED Provider Triage Note (Signed)
Emergency Medicine Provider Triage Evaluation Note  Katherine Franklin , a 57 y.o. female  was evaluated in triage.  Pt complains of over exposure to the cold.  She is homeless and cannot find a shelter because she believes they will hurt her due to her talking too much.  Also complaining of neuropathy in her hands and feet.  Patient was seen this morning for same.  Was also complaining of hyperglycemia at that time, however his CBG was 192.  Her CBG is now 370 per EMS.  Requesting detox from crack cocaine.  She uses this daily.  Denies chest pain or shortness of breath  Review of Systems  Positive: As above Negative: As above  Physical Exam  BP 116/87   Pulse (!) 106   Temp 98.9 F (37.2 C)   Resp 16   Ht 5\' 9"  (1.753 m)   Wt 104.3 kg   SpO2 98%   BMI 33.96 kg/m  Gen:   Awake, no distress   Resp:  Normal effort  MSK:   Moves extremities without difficulty  Other:    Medical Decision Making  Medically screening exam initiated at 8:45 PM.  Appropriate orders placed.  Katherine Franklin was informed that the remainder of the evaluation will be completed by another provider, this initial triage assessment does not replace that evaluation, and the importance of remaining in the ED until their evaluation is complete.     Katherine Franklin, Katherine Franklin 05/24/22 2045

## 2022-05-24 NOTE — ED Triage Notes (Signed)
Per EMS, pt c/o chills after using Cocaine today.  She admits to daily use of cocaine and they give her a "odd thoughts."  Pt has been off her psy meds X8 years.  Denies S/I to EMS.  176/90 120pulse 98% O2 CBG 370

## 2022-05-24 NOTE — ED Provider Notes (Signed)
[facility] EMERGENCY DEPARTMENT Provider Note   CSN: [id number] Arrival date & time: 05/24/22  0410     History  Chief Complaint  Patient presents with   Crack Withdrawal    Katherine Franklin is a 57 y.o. female.  The history is provided by the patient and medical records.   57 y.o. F presenting to the ED for multiple complaints.  Called EMS from [facility] for neuropathy pain.  Upon arrival to ED complaining of hyperglycemia.  Unable to afford her metformin, states CBGs have been reading "high".  Also reports concern for withdrawal from crack cocaine.  Last smoked about 30 mins PTA but states usually does not routinely throughout the day.  Patient is requesting discharge from triage.  Home Medications Prior to Admission medications   Medication Sig Start Date End Date Taking? Authorizing Provider  blood glucose meter kit and supplies KIT Dispense based on patient and insurance preference. Use up to four times daily as directed. 03/16/22   Charlynne Cousins, MD  famotidine (PEPCID) 20 MG tablet Take 1 tablet (20 mg total) by mouth 2 (two) times daily. 04/11/22   Tedd Sias, PA  insulin aspart (NOVOLOG) 100 UNIT/ML FlexPen Inject 3 Units into the skin 3 (three) times daily with meals. Patient not taking: Reported on 03/20/2022 03/16/22   Charlynne Cousins, MD  insulin detemir (LEVEMIR) 100 UNIT/ML FlexPen Inject 40 Units into the skin daily. Patient not taking: Reported on 03/20/2022 03/16/22   Charlynne Cousins, MD  Insulin Pen Needle 32G X 4 MM MISC Use 2 (two) times daily or as directed 03/16/22   Charlynne Cousins, MD  lisinopril (ZESTRIL) 20 MG tablet Take 1 tablet (20 mg total) by mouth daily. 03/16/22   Charlynne Cousins, MD  metFORMIN (GLUCOPHAGE-XR) 500 MG 24 hr tablet Take 4 tablets (2,000 mg total) by mouth every morning. 03/16/22 04/15/22  Charlynne Cousins, MD  OLANZapine (ZYPREXA) 10 MG tablet Take 1 tablet (10 mg total) by mouth 2  (two) times daily. 03/16/22   Charlynne Cousins, MD      Allergies    Sulfa antibiotics and Latex    Review of Systems   Review of Systems  Musculoskeletal:  Positive for arthralgias.  All other systems reviewed and are negative.   Physical Exam Updated Vital Signs BP 110/83   Pulse 97   Temp 97.7 F (36.5 C)   Resp 16   SpO2 99%   Physical Exam Vitals and nursing note reviewed.  Constitutional:      Appearance: She is well-developed.  HENT:     Head: Normocephalic and atraumatic.  Eyes:     Conjunctiva/sclera: Conjunctivae normal.     Pupils: Pupils are equal, round, and reactive to light.  Cardiovascular:     Rate and Rhythm: Normal rate and regular rhythm.     Heart sounds: Normal heart sounds.  Pulmonary:     Effort: Pulmonary effort is normal.     Breath sounds: Normal breath sounds.  Abdominal:     General: Bowel sounds are normal.     Palpations: Abdomen is soft.  Musculoskeletal:        General: Normal range of motion.     Cervical back: Normal range of motion.  Skin:    General: Skin is warm and dry.  Neurological:     Mental Status: She is alert and oriented to person, place, and time.     ED Results / Procedures /  Treatments   Labs (all labs ordered are listed, but only abnormal results are displayed) Labs Reviewed  CBG MONITORING, ED - Abnormal; Notable for the following components:      Result Value   Glucose-Capillary 192 (*)    All other components within normal limits    EKG None  Radiology No results found.  Procedures Procedures    Medications Ordered in ED Medications - No data to display  ED Course/ Medical Decision Making/ A&P                           Medical Decision Making  57 year old female presenting to the ED with multiple complaints.  Picked up for neuropathy but also complaining of hyperglycemia and possible withdrawal from crack cocaine upon arrival to ED.  She is awake, alert, oriented.  CBG in triage 192.   She does not have any clinical signs or symptoms of withdrawal at this time.  She is requesting to be discharged from triage, feel this is reasonable.  Will have her follow-up closely with her primary care doctor.  Can return here for new concerns.  Final Clinical Impression(s) / ED Diagnoses Final diagnoses:  Hyperglycemia  Crack cocaine use    Rx / DC Orders ED Discharge Orders     None         Larene Pickett, PA-C 05/24/22 0442    Palumbo, April, MD 05/24/22 [phone]

## 2022-05-24 NOTE — ED Triage Notes (Signed)
Arrives EMS from [facility]. Call out for neuropathy pain. Says she is unable to afford metformin and cbg S have read "too high". Also sts she smoked crack 30 minutes ago and is withdrawing as she smokes 24/7.   Stops triage to see if she can be discharged.

## 2022-05-24 NOTE — ED Provider Notes (Signed)
[facility] EMERGENCY DEPARTMENT Provider Note   CSN: [id number] Arrival date & time: 05/24/22  2020     History  Chief Complaint  Patient presents with   Cold Exposure    Katherine Franklin is a 57 y.o. female presented to ED with multiple complaints.  The patient reports she is homeless, and primarily she is concerned because she is cold and needs a place to stay.  She also reports that she had been using crack cocaine including earlier this evening, and she is hoping to "get some help to get off the cocaine".  She is complaining of pain and neuropathy in her hands and feet.  She says she has not of her medications.  She is also requesting food and hot coffee.  Patient does have a history of schizoaffective disorder and psychosis and was hospitalized for these issues at the start of this month, 1 week ago.  She has also been seen multiple times in the ED for cocaine use, cold exposure, hyperglycemia problems and homelessness, as well as malingering.  She has been seen by social work several times and provided with all available outpatient resources as well as homeless shelters.  HPI     Home Medications Prior to Admission medications   Medication Sig Start Date End Date Taking? Authorizing Provider  blood glucose meter kit and supplies KIT Dispense based on patient and insurance preference. Use up to four times daily as directed. 03/16/22   Charlynne Cousins, MD  famotidine (PEPCID) 20 MG tablet Take 1 tablet (20 mg total) by mouth 2 (two) times daily. 04/11/22   Tedd Sias, PA  insulin aspart (NOVOLOG) 100 UNIT/ML FlexPen Inject 3 Units into the skin 3 (three) times daily with meals. Patient not taking: Reported on 03/20/2022 03/16/22   Charlynne Cousins, MD  insulin detemir (LEVEMIR) 100 UNIT/ML FlexPen Inject 40 Units into the skin daily. Patient not taking: Reported on 03/20/2022 03/16/22   Charlynne Cousins, MD  Insulin Pen Needle 32G X 4 MM MISC Use 2  (two) times daily or as directed 03/16/22   Charlynne Cousins, MD  lisinopril (ZESTRIL) 20 MG tablet Take 1 tablet (20 mg total) by mouth daily. 03/16/22   Charlynne Cousins, MD  metFORMIN (GLUCOPHAGE-XR) 500 MG 24 hr tablet Take 4 tablets (2,000 mg total) by mouth every morning. 03/16/22 04/15/22  Charlynne Cousins, MD  OLANZapine (ZYPREXA) 10 MG tablet Take 1 tablet (10 mg total) by mouth 2 (two) times daily. 03/16/22   Charlynne Cousins, MD      Allergies    Sulfa antibiotics and Latex    Review of Systems   Review of Systems  Physical Exam Updated Vital Signs BP 116/87   Pulse (!) 106   Temp 98.9 F (37.2 C)   Resp 16   Ht 5\' 9"  (1.753 m)   Wt 104.3 kg   SpO2 98%   BMI 33.96 kg/m  Physical Exam Constitutional:      General: She is not in acute distress. HENT:     Head: Normocephalic and atraumatic.  Eyes:     Pupils: Pupils are equal, round, and reactive to light.     Comments: Conjunctive injected bilaterally  Cardiovascular:     Rate and Rhythm: Normal rate and regular rhythm.  Pulmonary:     Effort: Pulmonary effort is normal. No respiratory distress.  Abdominal:     General: There is no distension.     Tenderness: There  is no abdominal tenderness.  Skin:    General: Skin is warm and dry.  Neurological:     General: No focal deficit present.     Mental Status: She is alert. Mental status is at baseline.  Psychiatric:        Mood and Affect: Mood normal.        Behavior: Behavior normal.     ED Results / Procedures / Treatments   Labs (all labs ordered are listed, but only abnormal results are displayed) Labs Reviewed  CBC WITH DIFFERENTIAL/PLATELET - Abnormal; Notable for the following components:      Result Value   Hemoglobin 11.8 (*)    All other components within normal limits  BASIC METABOLIC PANEL - Abnormal; Notable for the following components:   Glucose, Bld 267 (*)    All other components within normal limits  CBG MONITORING, ED -  Abnormal; Notable for the following components:   Glucose-Capillary 246 (*)    All other components within normal limits    EKG None  Radiology No results found.  Procedures Procedures    Medications Ordered in ED Medications  gabapentin (NEURONTIN) capsule 100 mg (has no administration in time range)  ibuprofen (ADVIL) tablet 600 mg (has no administration in time range)  acetaminophen (TYLENOL) tablet 650 mg (has no administration in time range)  OLANZapine (ZYPREXA) tablet 10 mg (has no administration in time range)  insulin glargine-yfgn (SEMGLEE) injection 15 Units (has no administration in time range)    ED Course/ Medical Decision Making/ A&P                           Medical Decision Making Risk OTC drugs. Prescription drug management.   Patient is presenting with multiple complaints, primarily among them and she reports is that she is homeless, cold, needing a place to stay in some food.  Her blood sugar is mildly elevated today.  I doubt she is in diabetic ketoacidosis  I have ordered all of her evening medicines including olanzapine, 15 units of glargine, small dose gabapentin for neuropathy, Motrin and Tylenol.  We have also provided her some food and warm beverage.  I explained we cannot keep her in the emergency department for chronic housing needs.  She understands this.  Shelter resources have been provided.  I have not identified any other acute emergent medical concerns.  She does not demonstrate evidence of active psychosis, SI, hallucinations, to warrant IVC and emergent psychiatry evaluation at this time.   Dispostion:  After consideration of the diagnostic results and the patients response to treatment, I feel that the patent would benefit from outpatient follow-up.         Final Clinical Impression(s) / ED Diagnoses Final diagnoses:  Hyperglycemia  Homelessness  Crack cocaine use    Rx / DC Orders ED Discharge Orders     None          Marquavius Scaife, Carola Rhine, MD 05/24/22 2309

## 2022-05-25 LAB — CBG MONITORING, ED: Glucose-Capillary: 255 mg/dL — ABNORMAL HIGH (ref 70–99)

## 2022-05-27 ENCOUNTER — Other Ambulatory Visit: Payer: Self-pay

## 2022-05-27 ENCOUNTER — Emergency Department (HOSPITAL_COMMUNITY)
Admission: EM | Admit: 2022-05-27 | Discharge: 2022-05-27 | Discharge status: Against Medical Advice | Payer: Medicaid Other | Attending: Emergency Medicine | Admitting: Emergency Medicine

## 2022-05-27 DIAGNOSIS — Z5321 Procedure and treatment not carried out due to patient leaving prior to being seen by health care provider: Secondary | ICD-10-CM | POA: Insufficient documentation

## 2022-05-27 DIAGNOSIS — R462 Strange and inexplicable behavior: Secondary | ICD-10-CM | POA: Insufficient documentation

## 2022-05-27 NOTE — ED Notes (Signed)
While sitting in the lobby, pt began to strip off her clothes and threaten to fight others in the waiting room. [employer] escorted pt off premises and transported her to jail

## 2022-05-27 NOTE — ED Triage Notes (Signed)
Pt in via [facility] after being picked up naked at a gas station, bizarre behavior on presentation. Arrives talkative, speaking hypersexually and singing. Denies any substance use, also with medication noncompliance - hx of schizophrenia.  146/90 95 97% 20 CBG 136

## 2022-05-27 NOTE — ED Provider Triage Note (Signed)
Emergency Medicine Provider Triage Evaluation Note  Katherine Franklin , a 57 y.o. female  was evaluated in triage.  Pt complains of bizarre behavior.  She was picked up from local gas station, acting erratically, yelling and screaming.  She is currently singling and rambling in triage. Patient refusing to put clothes on.    Well known to this department for same-- hx depression, schizophrenia, heavy cocaine use.  Review of Systems  Positive: Manic/erratic behavior Negative: fever  Physical Exam  BP (!) 166/104 (BP Location: Left Arm)   Pulse (!) 118   Temp 98.2 F (36.8 C)   Resp 20   Wt 104.3 kg   SpO2 98%   BMI 33.96 kg/m   Gen:   Awake, no distress   Resp:  Normal effort  MSK:   Moves extremities without difficulty  Other:  Singing to self in triage, rambling  Medical Decision Making  Medically screening exam initiated at 4:14 AM.  Appropriate orders placed.  Katherine Franklin was informed that the remainder of the evaluation will be completed by another provider, this initial triage assessment does not replace that evaluation, and the importance of remaining in the ED until their evaluation is complete.  Agitated, yelling and screaming in triage.  Appears similar to prior ER presentations.  Longstanding hx of schizophrenia, cocaine abuse.  Refusing to put pants on triage.    4:56 AM Patient finally put clothing on, escorted to lobby with [employer].  Calm/cooperative at present.   Larene Pickett, PA-C 05/27/22 [phone]

## 2022-06-26 ENCOUNTER — Other Ambulatory Visit: Payer: Self-pay

## 2022-06-26 ENCOUNTER — Other Ambulatory Visit (HOSPITAL_COMMUNITY): Payer: Self-pay

## 2022-06-26 ENCOUNTER — Emergency Department (HOSPITAL_COMMUNITY)
Admission: EM | Admit: 2022-06-26 | Discharge: 2022-06-26 | Disposition: A | Discharge status: Home / Self Care | Payer: Medicaid Other | Attending: Emergency Medicine | Admitting: Emergency Medicine

## 2022-06-26 DIAGNOSIS — E119 Type 2 diabetes mellitus without complications: Secondary | ICD-10-CM | POA: Diagnosis not present

## 2022-06-26 DIAGNOSIS — I10 Essential (primary) hypertension: Secondary | ICD-10-CM | POA: Insufficient documentation

## 2022-06-26 DIAGNOSIS — F17210 Nicotine dependence, cigarettes, uncomplicated: Secondary | ICD-10-CM | POA: Diagnosis not present

## 2022-06-26 DIAGNOSIS — Z7984 Long term (current) use of oral hypoglycemic drugs: Secondary | ICD-10-CM | POA: Diagnosis not present

## 2022-06-26 DIAGNOSIS — Z794 Long term (current) use of insulin: Secondary | ICD-10-CM | POA: Diagnosis not present

## 2022-06-26 DIAGNOSIS — R1013 Epigastric pain: Secondary | ICD-10-CM | POA: Diagnosis present

## 2022-06-26 DIAGNOSIS — K2970 Gastritis, unspecified, without bleeding: Secondary | ICD-10-CM

## 2022-06-26 DIAGNOSIS — Z79899 Other long term (current) drug therapy: Secondary | ICD-10-CM | POA: Insufficient documentation

## 2022-06-26 DIAGNOSIS — Z91040 Latex allergy status: Secondary | ICD-10-CM | POA: Diagnosis not present

## 2022-06-26 LAB — CBC WITH DIFFERENTIAL/PLATELET
Abs Immature Granulocytes: 0.01 10*3/uL (ref 0.00–0.07)
Basophils Absolute: 0.1 10*3/uL (ref 0.0–0.1)
Basophils Relative: 1 %
Eosinophils Absolute: 0.2 10*3/uL (ref 0.0–0.5)
Eosinophils Relative: 2 %
HCT: 37.1 % (ref 36.0–46.0)
Hemoglobin: 11.9 g/dL — ABNORMAL LOW (ref 12.0–15.0)
Immature Granulocytes: 0 %
Lymphocytes Relative: 33 %
Lymphs Abs: 2.2 10*3/uL (ref 0.7–4.0)
MCH: 27 pg (ref 26.0–34.0)
MCHC: 32.1 g/dL (ref 30.0–36.0)
MCV: 84.1 fL (ref 80.0–100.0)
Monocytes Absolute: 0.4 10*3/uL (ref 0.1–1.0)
Monocytes Relative: 6 %
Neutro Abs: 3.8 10*3/uL (ref 1.7–7.7)
Neutrophils Relative %: 58 %
Platelets: 284 10*3/uL (ref 150–400)
RBC: 4.41 MIL/uL (ref 3.87–5.11)
RDW: 13.9 % (ref 11.5–15.5)
WBC: 6.6 10*3/uL (ref 4.0–10.5)
nRBC: 0 % (ref 0.0–0.2)

## 2022-06-26 LAB — COMPREHENSIVE METABOLIC PANEL
ALT: 11 U/L (ref 0–44)
AST: 11 U/L — ABNORMAL LOW (ref 15–41)
Albumin: 3.5 g/dL (ref 3.5–5.0)
Alkaline Phosphatase: 77 U/L (ref 38–126)
Anion gap: 9 (ref 5–15)
BUN: 29 mg/dL — ABNORMAL HIGH (ref 6–20)
CO2: 26 mmol/L (ref 22–32)
Calcium: 9.1 mg/dL (ref 8.9–10.3)
Chloride: 101 mmol/L (ref 98–111)
Creatinine, Ser: 0.93 mg/dL (ref 0.44–1.00)
GFR, Estimated: >60 mL/min (ref >60)
Glucose, Bld: 331 mg/dL — ABNORMAL HIGH (ref 70–99)
Potassium: 4.3 mmol/L (ref 3.5–5.1)
Sodium: 136 mmol/L (ref 135–145)
Total Bilirubin: 0.4 mg/dL (ref 0.3–1.2)
Total Protein: 7.2 g/dL (ref 6.5–8.1)

## 2022-06-26 LAB — URINALYSIS, ROUTINE W REFLEX MICROSCOPIC
Bilirubin Urine: NEGATIVE
Glucose, UA: >=500 mg/dL — AB
Hgb urine dipstick: NEGATIVE
Ketones, ur: 5 mg/dL — AB
Leukocytes,Ua: NEGATIVE
Nitrite: NEGATIVE
Protein, ur: NEGATIVE mg/dL
Specific Gravity, Urine: 1.028 (ref 1.005–1.030)
pH: 5 (ref 5.0–8.0)

## 2022-06-26 LAB — LIPASE, BLOOD: Lipase: 46 U/L (ref 11–51)

## 2022-06-26 LAB — WET PREP, GENITAL
Clue Cells Wet Prep HPF POC: NONE SEEN
Sperm: NONE SEEN
Trich, Wet Prep: NONE SEEN
WBC, Wet Prep HPF POC: <10 (ref <10)
Yeast Wet Prep HPF POC: NONE SEEN

## 2022-06-26 LAB — CBG MONITORING, ED: Glucose-Capillary: 295 mg/dL — ABNORMAL HIGH (ref 70–99)

## 2022-06-26 MED ORDER — INSULIN ASPART 100 UNIT/ML IJ SOLN
10.0000 [IU] | Freq: Once | INTRAMUSCULAR | Status: AC
  Administered 2022-06-26: 10 [IU] via SUBCUTANEOUS
  Filled 2022-06-26: qty 0.1

## 2022-06-26 MED ORDER — FAMOTIDINE 20 MG PO TABS
20.0000 mg | ORAL_TABLET | Freq: Two times a day (BID) | ORAL | 0 refills | Status: AC
  Filled 2022-06-26: qty 30, 15d supply, fill #0

## 2022-06-26 MED ORDER — INSULIN ASPART 100 UNIT/ML FLEXPEN
3.0000 [IU] | PEN_INJECTOR | Freq: Three times a day (TID) | SUBCUTANEOUS | 11 refills | Status: AC
  Filled 2022-06-26: qty 6, 67d supply, fill #0

## 2022-06-26 MED ORDER — INSULIN PEN NEEDLE 32G X 4 MM MISC: 1.0000 | Freq: Two times a day (BID) | 3 refills | Status: AC

## 2022-06-26 MED ORDER — INSULIN DETEMIR 100 UNIT/ML FLEXPEN
40.0000 [IU] | PEN_INJECTOR | Freq: Every day | SUBCUTANEOUS | 11 refills | Status: AC
  Filled 2022-06-26: qty 15, 38d supply, fill #0

## 2022-06-26 MED ORDER — METFORMIN HCL ER 500 MG PO TB24
2000.0000 mg | ORAL_TABLET | Freq: Every morning | ORAL | 0 refills | Status: AC
  Filled 2022-06-26: qty 120, 30d supply, fill #0

## 2022-06-26 MED ORDER — TECHLITE PEN NEEDLES 32G X 4 MM MISC
0 refills | Status: AC
  Filled 2022-06-26: qty 100, 25d supply, fill #0

## 2022-06-26 MED ORDER — ALUM & MAG HYDROXIDE-SIMETH 200-200-20 MG/5ML PO SUSP
30.0000 mL | Freq: Once | ORAL | Status: AC
  Administered 2022-06-26: 30 mL via ORAL
  Filled 2022-06-26: qty 30

## 2022-06-26 MED ORDER — ACCU-CHEK SOFTCLIX LANCETS MISC
0 refills | Status: AC
  Filled 2022-06-26: qty 100, 25d supply, fill #0

## 2022-06-26 MED ORDER — ACCU-CHEK GUIDE VI STRP
ORAL_STRIP | 0 refills | Status: AC
  Filled 2022-06-26: qty 100, 25d supply, fill #0

## 2022-06-26 MED ORDER — LISINOPRIL 20 MG PO TABS
20.0000 mg | ORAL_TABLET | Freq: Every day | ORAL | 0 refills | Status: AC
  Filled 2022-06-26: qty 30, 30d supply, fill #0

## 2022-06-26 MED ORDER — ONDANSETRON 4 MG PO TBDP
4.0000 mg | ORAL_TABLET | Freq: Once | ORAL | Status: AC
  Administered 2022-06-26: 4 mg via ORAL
  Filled 2022-06-26: qty 1

## 2022-06-26 MED ORDER — BLOOD GLUCOSE MONITOR KIT
PACK | 0 refills | Status: AC
  Filled 2022-06-26: qty 1, 1d supply, fill #0

## 2022-06-26 MED ORDER — SODIUM CHLORIDE 0.9 % IV BOLUS
1000.0000 mL | Freq: Once | INTRAVENOUS | Status: AC
  Administered 2022-06-26: 1000 mL via INTRAVENOUS

## 2022-06-26 MED ORDER — OLANZAPINE 10 MG PO TABS
10.0000 mg | ORAL_TABLET | Freq: Two times a day (BID) | ORAL | 3 refills | Status: AC
  Filled 2022-06-26: qty 60, 30d supply, fill #0

## 2022-06-26 NOTE — ED Triage Notes (Addendum)
Hx diabetes, schizo. Has not had any medications in "forever". Called medics d/t "feeling cold and needing to come to [place] because I have court here today" c/o nausea and cold hands. CBG 380. 18g LAC placed by medics. 39m zofran, 500cc fluid bolus.

## 2022-06-26 NOTE — ED Provider Notes (Signed)
[place] Provider Note  CSN: [id number] Arrival date & time: 06/26/22 [id number]  Chief Complaint(s) Nausea  HPI Katherine Franklin is a 57 y.o. female with history of schizophrenia, GERD, diabetes presenting to the emergency department with nausea.  She reports 2 days of nausea, epigastric pain.  She reports it is mild.  She reports that it radiates upwards.  She reports it feels like prior episodes of GERD.  No fevers or chills, chest pain, shortness of breath.  No other abdominal pain.  No diarrhea.  No hematemesis.  Denies alcohol.  She reports that she went to [facility], was discharged.  She reports she then took the bus over to [place] as she has a court date here, she reports that she had a repair shop call EMS as she was still feeling nauseous.  No headaches.   Patient also complains of yellowish vaginal discharge which is been going on for a while.  Unable to quantify.  No pelvic pain. Past Medical History Past Medical History:  Diagnosis Date   Diabetes mellitus without complication ([place])    Drug abuse ([place]) 09/22/2018   GERD (gastroesophageal reflux disease)    Hypertension    PTSD (post-traumatic stress disorder)    Schizo affective schizophrenia [facility])    Patient Active Problem List   Diagnosis Date Noted   Involuntary commitment    Gangrene ([place]) 03/08/2022   Psychosis ([place]) 03/07/2022   Type 2 diabetes mellitus ([place]) 03/07/2022   Dry gangrene toes of bilateral feet ([place]) 03/07/2022   Inadequate community resources 02/26/2022   Cocaine use disorder ([place]) 02/26/2022   Cellulitis 04/19/2019   Substance induced mood disorder ([place]) 04/09/2019   Homelessness 04/09/2019   History of posttraumatic stress disorder (PTSD) 04/09/2019   Cocaine dependence ([place]) 08/18/2018   Major depressive disorder, recurrent severe without psychotic features ([place]) 08/18/2018   Home Medication(s) Prior to Admission medications    Medication Sig Start Date End Date Taking? Authorizing Provider  blood glucose meter kit and supplies KIT Dispense based on patient and insurance preference. Use up to four times daily as directed. 06/26/22   Cristie Hem, MD  famotidine (PEPCID) 20 MG tablet Take 1 tablet (20 mg total) by mouth 2 (two) times daily. 06/26/22   Cristie Hem, MD  insulin aspart (NOVOLOG) 100 UNIT/ML FlexPen Inject 3 Units into the skin 3 (three) times daily with meals. 06/26/22   Cristie Hem, MD  insulin detemir (LEVEMIR) 100 UNIT/ML FlexPen Inject 40 Units into the skin daily. 06/26/22   Cristie Hem, MD  Insulin Pen Needle 32G X 4 MM MISC Use 2 (two) times daily or as directed 06/26/22   Cristie Hem, MD  lisinopril (ZESTRIL) 20 MG tablet Take 1 tablet (20 mg total) by mouth daily. 06/26/22   Cristie Hem, MD  metFORMIN (GLUCOPHAGE-XR) 500 MG 24 hr tablet Take 4 tablets (2,000 mg total) by mouth every morning. 06/26/22 07/26/22  Cristie Hem, MD  OLANZapine (ZYPREXA) 10 MG tablet Take 1 tablet (10 mg total) by mouth 2 (two) times daily. 06/26/22   Cristie Hem, MD  Past Surgical History Past Surgical History:  Procedure Laterality Date   SKIN GRAFT     TUBAL LIGATION     Family History Family History  Family history unknown: Yes    Social History Social History   Tobacco Use   Smoking status: Every Day    Packs/day: 2.00    Types: Cigarettes   Smokeless tobacco: Never  Vaping Use   Vaping Use: Never used  Substance Use Topics   Alcohol use: Not Currently   Drug use: Yes    Frequency: 7.0 times per week    Types: "Crack" cocaine, Cocaine    Comment: undetermined amount   Allergies Sulfa antibiotics and Latex  Review of Systems Review of Systems  All other systems reviewed and are negative.   Physical Exam Vital  Signs  I have reviewed the triage vital signs BP (!) 159/86   Pulse 86   Temp 98.6 F (37 C) (Oral)   Resp 15   SpO2 100%  Physical Exam Vitals and nursing note reviewed.  Constitutional:      General: She is not in acute distress.    Appearance: She is well-developed.  HENT:     Head: Normocephalic and atraumatic.     Mouth/Throat:     Mouth: Mucous membranes are moist.  Eyes:     Pupils: Pupils are equal, round, and reactive to light.  Cardiovascular:     Rate and Rhythm: Normal rate and regular rhythm.     Heart sounds: No murmur heard. Pulmonary:     Effort: Pulmonary effort is normal. No respiratory distress.     Breath sounds: Normal breath sounds.  Abdominal:     General: Abdomen is flat.     Palpations: Abdomen is soft.     Tenderness: There is no abdominal tenderness.  Musculoskeletal:        General: No tenderness.     Right lower leg: No edema.     Left lower leg: No edema.  Skin:    General: Skin is warm and dry.  Neurological:     General: No focal deficit present.     Mental Status: She is alert. Mental status is at baseline.  Psychiatric:        Mood and Affect: Mood normal.        Behavior: Behavior normal.     ED Results and Treatments Labs (all labs ordered are listed, but only abnormal results are displayed) Labs Reviewed  COMPREHENSIVE METABOLIC PANEL - Abnormal; Notable for the following components:      Result Value   Glucose, Bld 331 (*)    BUN 29 (*)    AST 11 (*)    All other components within normal limits  CBC WITH DIFFERENTIAL/PLATELET - Abnormal; Notable for the following components:   Hemoglobin 11.9 (*)    All other components within normal limits  URINALYSIS, ROUTINE W REFLEX MICROSCOPIC - Abnormal; Notable for the following components:   Glucose, UA >=500 (*)    Ketones, ur 5 (*)    Bacteria, UA RARE (*)    All other components within normal limits  CBG MONITORING, ED - Abnormal; Notable for the following components:    Glucose-Capillary 295 (*)    All other components within normal limits  WET PREP, GENITAL  LIPASE, BLOOD  GC/CHLAMYDIA PROBE AMP ([place]) NOT AT [facility]  Radiology No results found.  Pertinent labs & imaging results that were available during my care of the patient were reviewed by me and considered in my medical decision making (see MDM for details).  Medications Ordered in ED Medications  alum & mag hydroxide-simeth (MAALOX/MYLANTA) 200-200-20 MG/5ML suspension 30 mL (30 mLs Oral Given 06/26/22 0957)  ondansetron (ZOFRAN-ODT) disintegrating tablet 4 mg (4 mg Oral Given 06/26/22 0957)  insulin aspart (novoLOG) injection 10 Units (10 Units Subcutaneous Given 06/26/22 0958)  sodium chloride 0.9 % bolus 1,000 mL (0 mLs Intravenous Stopped 06/26/22 1125)                                                                                                                                     Procedures Procedures  (including critical care time)  Medical Decision Making / ED Course   MDM:  57 year old female presenting to the emergency department for nausea.  Patient well-appearing, physical exam with no abdominal tenderness.  Vital signs reassuring.  Point-of-care glucose elevated.  Suspect most likely cause is gastritis/GERD given similarity to previous episodes.  Will trial Zofran and GI cocktail.  Less likely intra-abdominal pathology with no tenderness, will check lipase, CMP to further evaluate.  Will check ECG but symptoms very atypical for ACS, no chest pain, shortness of breath.  Doubt vascular catastrophe.  Will give IV fluids and insulin given hyperglycemia.  Patient will self swab for wet prep and GC probe.  She reports that this has been a chronic issue so would defer empiric treatment for gonorrhea and chlamydia.  If patient feels better and laboratory workup, EKG  reassuring, likely discharge with refill of medication.  Clinical Course as of 06/26/22 1132  Tue Jun 26, 2022  1129 Patient reports her symptoms resolved. Likely chronic gastritis. Wet prep negative. Sent refills although patient reports she probably won't pick them up and doesn't want to wait for TOC to get her medicines. GC probe sent.  Patient tolerating p.o. Will discharge patient to home. All questions answered. Patient comfortable with plan of discharge. Return precautions discussed with patient and specified on the after visit summary.  [WS]    Clinical Course User Index [WS] Cristie Hem, MD     Additional history obtained: -Additional history obtained from ems -External records from outside source obtained and reviewed including: Chart review including previous notes, labs, imaging, consultation notes including [facility] ED notes from earlier today   Lab Tests: -I ordered, reviewed, and interpreted labs.   The pertinent results include:   Labs Reviewed  COMPREHENSIVE METABOLIC PANEL - Abnormal; Notable for the following components:      Result Value   Glucose, Bld 331 (*)    BUN 29 (*)    AST 11 (*)    All other components within normal limits  CBC WITH DIFFERENTIAL/PLATELET - Abnormal; Notable for the following components:   Hemoglobin 11.9 (*)    All other  components within normal limits  URINALYSIS, ROUTINE W REFLEX MICROSCOPIC - Abnormal; Notable for the following components:   Glucose, UA >=500 (*)    Ketones, ur 5 (*)    Bacteria, UA RARE (*)    All other components within normal limits  CBG MONITORING, ED - Abnormal; Notable for the following components:   Glucose-Capillary 295 (*)    All other components within normal limits  WET PREP, GENITAL  LIPASE, BLOOD  GC/CHLAMYDIA PROBE AMP ([place]) NOT AT [facility]    Notable for hyperglycemia, mild anemia  EKG   EKG Interpretation  Date/Time:  Tuesday June 26 2022 09:56:16 EST Ventricular Rate:  83 PR  Interval:  143 QRS Duration: 80 QT Interval:  386 QTC Calculation: 454 R Axis:   61 Text Interpretation: Sinus rhythm Abnormal R-wave progression, early transition Confirmed by Garnette Gunner [phone]) on 06/26/2022 10:03:50 AM         Medicines ordered and prescription drug management: Meds ordered this encounter  Medications   alum & mag hydroxide-simeth (MAALOX/MYLANTA) 200-200-20 MG/5ML suspension 30 mL   ondansetron (ZOFRAN-ODT) disintegrating tablet 4 mg   insulin aspart (novoLOG) injection 10 Units   sodium chloride 0.9 % bolus 1,000 mL   famotidine (PEPCID) 20 MG tablet    Sig: Take 1 tablet (20 mg total) by mouth 2 (two) times daily.    Dispense:  30 tablet    Refill:  0   insulin aspart (NOVOLOG) 100 UNIT/ML FlexPen    Sig: Inject 3 Units into the skin 3 (three) times daily with meals.    Dispense:  10 mL    Refill:  11   insulin detemir (LEVEMIR) 100 UNIT/ML FlexPen    Sig: Inject 40 Units into the skin daily.    Dispense:  15 mL    Refill:  11   Insulin Pen Needle 32G X 4 MM MISC    Sig: Use 2 (two) times daily or as directed    Dispense:  100 each    Refill:  3   blood glucose meter kit and supplies KIT    Sig: Dispense based on patient and insurance preference. Use up to four times daily as directed.    Dispense:  1 each    Refill:  0    Order Specific Question:   Number of strips    Answer:   100    Order Specific Question:   Number of lancets    Answer:   100   lisinopril (ZESTRIL) 20 MG tablet    Sig: Take 1 tablet (20 mg total) by mouth daily.    Dispense:  30 tablet    Refill:  0   metFORMIN (GLUCOPHAGE-XR) 500 MG 24 hr tablet    Sig: Take 4 tablets (2,000 mg total) by mouth every morning.    Dispense:  120 tablet    Refill:  0   OLANZapine (ZYPREXA) 10 MG tablet    Sig: Take 1 tablet (10 mg total) by mouth 2 (two) times daily.    Dispense:  60 tablet    Refill:  3    ok to change per Dr. Aileen Fass    -I have reviewed the patients home  medicines and have made adjustments as needed  Social Determinants of Health:  Diagnosis or treatment significantly limited by social determinants of health: current smoker, polysubstance abuse, and homelessness   Reevaluation: After the interventions noted above, I reevaluated the patient and found that they have resolved  Co  morbidities that complicate the patient evaluation  Past Medical History:  Diagnosis Date   Diabetes mellitus without complication ([place])    Drug abuse ([place]) 09/22/2018   GERD (gastroesophageal reflux disease)    Hypertension    PTSD (post-traumatic stress disorder)    Schizo affective schizophrenia ([place])       Dispostion: Disposition decision including need for hospitalization was considered, and patient discharged from emergency department.    Final Clinical Impression(s) / ED Diagnoses Final diagnoses:  Gastritis without bleeding, unspecified chronicity, unspecified gastritis type     This chart was dictated using voice recognition software.  Despite best efforts to proofread,  errors can occur which can change the documentation meaning.    Cristie Hem, MD 06/26/22 [phone]

## 2022-06-27 ENCOUNTER — Emergency Department (HOSPITAL_COMMUNITY): Payer: Medicaid Other

## 2022-06-27 ENCOUNTER — Emergency Department (HOSPITAL_COMMUNITY)
Admission: EM | Admit: 2022-06-27 | Discharge: 2022-06-28 | Disposition: A | Discharge status: Home / Self Care | Payer: Medicaid Other | Attending: Emergency Medicine | Admitting: Emergency Medicine

## 2022-06-27 ENCOUNTER — Other Ambulatory Visit: Payer: Self-pay

## 2022-06-27 ENCOUNTER — Encounter (HOSPITAL_COMMUNITY): Payer: Self-pay

## 2022-06-27 DIAGNOSIS — Z794 Long term (current) use of insulin: Secondary | ICD-10-CM | POA: Insufficient documentation

## 2022-06-27 DIAGNOSIS — Z7984 Long term (current) use of oral hypoglycemic drugs: Secondary | ICD-10-CM | POA: Diagnosis not present

## 2022-06-27 DIAGNOSIS — Y900 Blood alcohol level of less than 20 mg/100 ml: Secondary | ICD-10-CM | POA: Insufficient documentation

## 2022-06-27 DIAGNOSIS — F1414 Cocaine abuse with cocaine-induced mood disorder: Secondary | ICD-10-CM

## 2022-06-27 DIAGNOSIS — R109 Unspecified abdominal pain: Secondary | ICD-10-CM | POA: Diagnosis not present

## 2022-06-27 DIAGNOSIS — F259 Schizoaffective disorder, unspecified: Secondary | ICD-10-CM | POA: Insufficient documentation

## 2022-06-27 DIAGNOSIS — R45851 Suicidal ideations: Secondary | ICD-10-CM | POA: Diagnosis not present

## 2022-06-27 DIAGNOSIS — R45850 Homicidal ideations: Secondary | ICD-10-CM | POA: Insufficient documentation

## 2022-06-27 DIAGNOSIS — F1410 Cocaine abuse, uncomplicated: Secondary | ICD-10-CM

## 2022-06-27 DIAGNOSIS — R0789 Other chest pain: Secondary | ICD-10-CM | POA: Insufficient documentation

## 2022-06-27 DIAGNOSIS — Z91040 Latex allergy status: Secondary | ICD-10-CM | POA: Diagnosis not present

## 2022-06-27 DIAGNOSIS — Z5900 Homelessness unspecified: Secondary | ICD-10-CM | POA: Diagnosis not present

## 2022-06-27 DIAGNOSIS — F1420 Cocaine dependence, uncomplicated: Secondary | ICD-10-CM | POA: Diagnosis not present

## 2022-06-27 DIAGNOSIS — E119 Type 2 diabetes mellitus without complications: Secondary | ICD-10-CM | POA: Insufficient documentation

## 2022-06-27 LAB — I-STAT BETA HCG BLOOD, ED (MC, WL, AP ONLY): I-stat hCG, quantitative: <5 m[IU]/mL (ref <5)

## 2022-06-27 LAB — CBC WITH DIFFERENTIAL/PLATELET
Abs Immature Granulocytes: 0.01 10*3/uL (ref 0.00–0.07)
Basophils Absolute: 0.1 10*3/uL (ref 0.0–0.1)
Basophils Relative: 1 %
Eosinophils Absolute: 0.1 10*3/uL (ref 0.0–0.5)
Eosinophils Relative: 3 %
HCT: 35.8 % — ABNORMAL LOW (ref 36.0–46.0)
Hemoglobin: 11.8 g/dL — ABNORMAL LOW (ref 12.0–15.0)
Immature Granulocytes: 0 %
Lymphocytes Relative: 49 %
Lymphs Abs: 2 10*3/uL (ref 0.7–4.0)
MCH: 27.6 pg (ref 26.0–34.0)
MCHC: 33 g/dL (ref 30.0–36.0)
MCV: 83.8 fL (ref 80.0–100.0)
Monocytes Absolute: 0.3 10*3/uL (ref 0.1–1.0)
Monocytes Relative: 7 %
Neutro Abs: 1.7 10*3/uL (ref 1.7–7.7)
Neutrophils Relative %: 40 %
Platelets: 262 10*3/uL (ref 150–400)
RBC: 4.27 MIL/uL (ref 3.87–5.11)
RDW: 14.4 % (ref 11.5–15.5)
WBC: 4.2 10*3/uL (ref 4.0–10.5)
nRBC: 0 % (ref 0.0–0.2)

## 2022-06-27 LAB — COMPREHENSIVE METABOLIC PANEL
ALT: 11 U/L (ref 0–44)
AST: 15 U/L (ref 15–41)
Albumin: 3.5 g/dL (ref 3.5–5.0)
Alkaline Phosphatase: 81 U/L (ref 38–126)
Anion gap: 10 (ref 5–15)
BUN: 25 mg/dL — ABNORMAL HIGH (ref 6–20)
CO2: 25 mmol/L (ref 22–32)
Calcium: 9.4 mg/dL (ref 8.9–10.3)
Chloride: 99 mmol/L (ref 98–111)
Creatinine, Ser: 0.78 mg/dL (ref 0.44–1.00)
GFR, Estimated: >60 mL/min (ref >60)
Glucose, Bld: 367 mg/dL — ABNORMAL HIGH (ref 70–99)
Potassium: 4 mmol/L (ref 3.5–5.1)
Sodium: 134 mmol/L — ABNORMAL LOW (ref 135–145)
Total Bilirubin: <0.1 mg/dL — ABNORMAL LOW (ref 0.3–1.2)
Total Protein: 6.7 g/dL (ref 6.5–8.1)

## 2022-06-27 LAB — ETHANOL: Alcohol, Ethyl (B): <10 mg/dL (ref <10)

## 2022-06-27 LAB — GC/CHLAMYDIA PROBE AMP (~~LOC~~) NOT AT ARMC
Chlamydia: NEGATIVE
Comment: NEGATIVE
Comment: NORMAL
Neisseria Gonorrhea: NEGATIVE

## 2022-06-27 LAB — TROPONIN I (HIGH SENSITIVITY): Troponin I (High Sensitivity): 3 ng/L (ref <18)

## 2022-06-27 MED ORDER — OLANZAPINE 5 MG PO TBDP
10.0000 mg | ORAL_TABLET | Freq: Once | ORAL | Status: AC
  Administered 2022-06-27: 10 mg via ORAL
  Filled 2022-06-27: qty 2

## 2022-06-27 MED ORDER — ALUM & MAG HYDROXIDE-SIMETH 200-200-20 MG/5ML PO SUSP
30.0000 mL | Freq: Once | ORAL | Status: AC
  Administered 2022-06-27: 30 mL via ORAL
  Filled 2022-06-27: qty 30

## 2022-06-27 MED ORDER — LORAZEPAM 1 MG PO TABS
1.0000 mg | ORAL_TABLET | Freq: Once | ORAL | Status: AC
  Administered 2022-06-27: 1 mg via ORAL
  Filled 2022-06-27: qty 1

## 2022-06-27 NOTE — ED Triage Notes (Addendum)
Pt arrives via EMS. Pt was going to go see if she could get some help at [facility]. Per EMS, [facility] would not see the patient due to using crack cocaine today. Pt reports yesterday she was having suicidal thoughts. States her plan would be to shoot a lot of people and then take her own life. Pt is currently AxOx4. Reports she is currently having chest pain. States it started over an hour ago when she used the crack cocaine.

## 2022-06-27 NOTE — ED Provider Notes (Signed)
[facility] Provider Note   CSN: [id number] Arrival date & time: 06/27/22  1806     History  Chief Complaint  Patient presents with   Suicidal   Homicidal   Chest Pain    Katherine Franklin is a 57 y.o. female.  57 yo F with a cc of suicidal and homicidal ideation.  Patient states that she plans on killing herself but also is planning on finding a gun and killing other people.  She has no one in particular that she plans to hurt.  She does not have access to a firearm but says she knows how she can get 1.  She had been doing cocaine today.  She has been having some left-sided chest discomfort.  She went to the behavioral health hospital and unfortunately due to her history of substance abuse she sent her here for evaluation.  She was actually seen here yesterday for abdominal pain.  I asked her about this and she says that feels better.  Chest pain is not exertional.  She says she gets similar pains when she feels the need to use illicit substance again.  She thinks it is more of a craving or may be withdrawal.   Chest Pain      Home Medications Prior to Admission medications   Medication Sig Start Date End Date Taking? Authorizing Provider  Accu-Chek Softclix Lancets lancets Use to test blood sugar up to 4 times a day as directed 06/26/22   Cristie Hem, MD  blood glucose meter kit and supplies KIT Use up to 4 times daily as directed. 06/26/22   Cristie Hem, MD  famotidine (PEPCID) 20 MG tablet Take 1 tablet (20 mg total) by mouth 2 (two) times daily. 06/26/22   Cristie Hem, MD  glucose blood (ACCU-CHEK GUIDE) test strip Use to test blood sugar up to 4 times a day as directed 06/26/22   Cristie Hem, MD  insulin aspart (NOVOLOG) 100 UNIT/ML FlexPen Inject 3 Units into the skin 3 (three) times daily with meals. 06/26/22   Cristie Hem, MD  insulin detemir (LEVEMIR) 100 UNIT/ML FlexPen Inject 40 Units into the  skin daily. 06/26/22   Cristie Hem, MD  Insulin Pen Needle (TECHLITE PEN NEEDLES) 32G X 4 MM MISC Use to inject insulin 4 times a day 06/26/22   Cristie Hem, MD  Insulin Pen Needle 32G X 4 MM MISC Use 2 (two) times daily or as directed 06/26/22   Cristie Hem, MD  lisinopril (ZESTRIL) 20 MG tablet Take 1 tablet (20 mg total) by mouth daily. 06/26/22   Cristie Hem, MD  metFORMIN (GLUCOPHAGE-XR) 500 MG 24 hr tablet Take 4 tablets by mouth every morning. 06/26/22 07/26/22  Cristie Hem, MD  OLANZapine (ZYPREXA) 10 MG tablet Take 1 tablet (10 mg total) by mouth 2 (two) times daily. 06/26/22   Cristie Hem, MD      Allergies    Sulfa antibiotics and Latex    Review of Systems   Review of Systems  Cardiovascular:  Positive for chest pain.    Physical Exam Updated Vital Signs BP 123/78   Pulse 96   Temp 98.7 F (37.1 C)   Resp 17   Ht 5' 9"$  (1.753 m)   Wt 90.7 kg   SpO2 100%   BMI 29.53 kg/m  Physical Exam Vitals and nursing note reviewed.  Constitutional:      General:  She is not in acute distress.    Appearance: She is well-developed. She is not diaphoretic.  HENT:     Head: Normocephalic and atraumatic.  Eyes:     Pupils: Pupils are equal, round, and reactive to light.  Cardiovascular:     Rate and Rhythm: Normal rate and regular rhythm.     Heart sounds: No murmur heard.    No friction rub. No gallop.  Pulmonary:     Effort: Pulmonary effort is normal.     Breath sounds: No wheezing or rales.  Abdominal:     General: There is no distension.     Palpations: Abdomen is soft.     Tenderness: There is no abdominal tenderness.  Musculoskeletal:        General: No tenderness.     Cervical back: Normal range of motion and neck supple.  Skin:    General: Skin is warm and dry.  Neurological:     Mental Status: She is alert and oriented to person, place, and time.  Psychiatric:        Behavior: Behavior normal.     ED Results /  Procedures / Treatments   Labs (all labs ordered are listed, but only abnormal results are displayed) Labs Reviewed  COMPREHENSIVE METABOLIC PANEL - Abnormal; Notable for the following components:      Result Value   Sodium 134 (*)    Glucose, Bld 367 (*)    BUN 25 (*)    Total Bilirubin <0.1 (*)    All other components within normal limits  CBC WITH DIFFERENTIAL/PLATELET - Abnormal; Notable for the following components:   Hemoglobin 11.8 (*)    HCT 35.8 (*)    All other components within normal limits  ETHANOL  RAPID URINE DRUG SCREEN, HOSP PERFORMED  I-STAT BETA HCG BLOOD, ED ([facility], [facility], [facility] ONLY)  TROPONIN I (HIGH SENSITIVITY)    EKG EKG Interpretation  Date/Time:  Wednesday June 27 2022 18:27:12 EST Ventricular Rate:  93 PR Interval:  139 QRS Duration: 79 QT Interval:  373 QTC Calculation: 464 R Axis:   69 Text Interpretation: Sinus rhythm Consider left ventricular hypertrophy No significant change since last tracing Confirmed by Deno Etienne [phone]) on 06/27/2022 6:45:39 PM  Radiology DG Chest 1 View  Result Date: 06/27/2022 CLINICAL DATA:  Chest pain EXAM: CHEST  1 VIEW COMPARISON:  Chest x-ray 02/24/2022 FINDINGS: The heart size and mediastinal contours are within normal limits. Both lungs are clear. The visualized skeletal structures are unremarkable. IMPRESSION: No active disease. Electronically Signed   By: Ronney Asters M.D.   On: 06/27/2022 19:27    Procedures Procedures    Medications Ordered in ED Medications  alum & mag hydroxide-simeth (MAALOX/MYLANTA) 200-200-20 MG/5ML suspension 30 mL (30 mLs Oral Given 06/27/22 1838)  OLANZapine zydis (ZYPREXA) disintegrating tablet 10 mg (10 mg Oral Given 06/27/22 1922)  LORazepam (ATIVAN) tablet 1 mg (1 mg Oral Given 06/27/22 1922)    ED Course/ Medical Decision Making/ A&P                             Medical Decision Making Amount and/or Complexity of Data Reviewed Labs: ordered. Radiology: ordered.  Risk OTC  drugs. Prescription drug management.   57 yo F with a chief complaints of suicidal homicidal ideation.  The patient actually was seen yesterday in the ED for abdominal discomfort.  She is back again today after going to the behavioral health  urgent care center.  She is describing some chest discomfort in the setting of cocaine use will obtain a troponin though seems very atypical as she feels that it is more related to her withdrawing from cocaine and wanting to use more.  Once this is resulted I feel she is medically clear for TTS evaluation.  Troponins negative, no significant electrolyte abnormality patient is not pregnant chest x-ray independently interpreted by me without focal infiltrate and pneumothorax.  I feel she medically clear for TTS evaluation.  The patients results and plan were reviewed and discussed.   Any x-rays performed were independently reviewed by myself.   Differential diagnosis were considered with the presenting HPI.  Medications  alum & mag hydroxide-simeth (MAALOX/MYLANTA) 200-200-20 MG/5ML suspension 30 mL (30 mLs Oral Given 06/27/22 1838)  OLANZapine zydis (ZYPREXA) disintegrating tablet 10 mg (10 mg Oral Given 06/27/22 1922)  LORazepam (ATIVAN) tablet 1 mg (1 mg Oral Given 06/27/22 1922)    Vitals:   06/27/22 1807 06/27/22 1816 06/27/22 1820  BP:   123/78  Pulse:   96  Resp:   17  Temp:   98.7 F (37.1 C)  SpO2: 100%  100%  Weight:  90.7 kg   Height:  5' 9"$  (1.753 m)     Final diagnoses:  Suicidal ideations           Final Clinical Impression(s) / ED Diagnoses Final diagnoses:  Suicidal ideations    Rx / DC Orders ED Discharge Orders     None         Deno Etienne, DO 06/27/22 2313

## 2022-06-27 NOTE — ED Notes (Signed)
Pt extremely tearful/distraught, crying out, expressing details of trauma experienced in childhood in rapid speech. Pt expresses wish to seek help. RN requested PRN medication for agitation, MD placed ordered, and pt accepted medication with no issue. Pt provided drinks and snacks. Pt not violent at this time. Able to be redirected verbally.

## 2022-06-27 NOTE — ED Notes (Signed)
Security currently checking patient's belongings for any weapons.

## 2022-06-27 NOTE — ED Notes (Signed)
Pt being wanded at this time by security

## 2022-06-27 NOTE — ED Notes (Signed)
Pt is calm and cooperative. Items inventoried. Changed into purple scrubs.

## 2022-06-28 MED ORDER — IBUPROFEN 400 MG PO TABS
600.0000 mg | ORAL_TABLET | Freq: Four times a day (QID) | ORAL | Status: DC | PRN
  Administered 2022-06-28: 600 mg via ORAL
  Filled 2022-06-28: qty 1

## 2022-06-28 MED ORDER — ACETAMINOPHEN 325 MG PO TABS
650.0000 mg | ORAL_TABLET | Freq: Four times a day (QID) | ORAL | Status: DC | PRN
  Administered 2022-06-28: 650 mg via ORAL
  Filled 2022-06-28: qty 2

## 2022-06-28 NOTE — ED Provider Notes (Signed)
Emergency Medicine Observation Re-evaluation Note  Katherine Franklin is a 57 y.o. female, seen on rounds today.  Pt initially presented to the ED for complaints of cocaine use and related behavior/mood disorder.  Pt observed in ED for multiple hours, substances metabolized - pt now says feels fine and is requesting d/c.   Physical Exam  BP 116/69 (BP Location: Left Arm)   Pulse 79   Temp (!) 97.2 F (36.2 C) (Oral)   Resp 16   Ht 1.753 m (5' 9"$ )   Wt 90.7 kg   SpO2 100%   BMI 29.53 kg/m  Physical Exam General: alert, conversant, Cardiac: regular rate.  Lungs: breathing comfortably. Psych: pt conversant. Does not appear acutely depressed or despondent. Indicates is feeling upset b/c staff have taken her things. Denies any thoughts of harm to self or others. Pt does not appear to be responding to internal stimuli - no delusions or hallucinations noted.   ED Course / MDM    I have reviewed the labs performed to date as well as medications administered while in observation.  Recent changes in the last 24 hours include ED obs, metabolism of substances, reassessment.   Plan  Pt has been in ED for 13+ hours. Substances metabolized.   Reviewed nursing notes and prior charts for additional history. From prior visits, and Care Everywhere, it appears pt with long hx c/w earlier behavior when intoxicated/under influence of cocaine.   Pt currently indicates feels improved, is not having any thoughts of harm to self or others, and is requesting d/c.    Pt vitals normal, normal appetite, no acute psychosis noted - pt currently appears stable for d/c per her request.   Pt is encouraged to pursue outpatient pcp, bh and SUD treatment as outpatient - resources provided.   Return precautions also provided.       Lajean Saver, MD 06/28/22 [phone]

## 2022-06-28 NOTE — BH Assessment (Signed)
Clinician messaged [place] A. [place], RN: "Hey. It's Trey with TTS. Is the pt able to engage in the assessment, if so the pt will need to be placed in a private room. Is the pt under IVC? Also is the pt medically cleared?"   Clinician awaiting response.    Vertell Novak, [place], [facility], [facility] Triage Specialist [phone]

## 2022-06-28 NOTE — ED Notes (Signed)
Pt having TTS eval

## 2022-06-28 NOTE — Discharge Instructions (Addendum)
It was our pleasure to provide your ER care today - we hope that you feel better.  Avoid drug/cocaine use as it is harmful to your physical health and mental well-being. See resource guide attached in terms of accessing inpatient or outpatient substance use treatment programs.  Also see additional resources provided for shelter and other social service resources in the area.   Follow up closely with primary care doctor and behavioral health provider in the coming week.  For mental health issues and/or crisis, you may also go directly to the [facility] - they are open 24/7 and walk-ins are welcome.    Return to ER if worse, new symptoms, fevers, chest pain, trouble breathing, or other emergency concern.
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
## 2022-06-28 NOTE — ED Notes (Signed)
Pt given bus pass to go home.  Pt given suitcase and bag of belongings upon discharge by MD Steinl.  Pt escorted out by [employer].

## 2022-06-28 NOTE — ED Notes (Signed)
Pt back to [facility]

## 2022-07-03 ENCOUNTER — Other Ambulatory Visit (HOSPITAL_COMMUNITY): Payer: Self-pay

## 2022-07-05 ENCOUNTER — Other Ambulatory Visit (HOSPITAL_COMMUNITY): Payer: Self-pay

## 2024-02-24 ENCOUNTER — Other Ambulatory Visit (HOSPITAL_COMMUNITY): Payer: Self-pay

## 2024-05-01 ENCOUNTER — Other Ambulatory Visit (HOSPITAL_BASED_OUTPATIENT_CLINIC_OR_DEPARTMENT_OTHER): Payer: Self-pay
